# Patient Record
Sex: Female | Born: 1985 | Race: White | Hispanic: No | Marital: Single | Smoking: Current every day smoker
Health system: Southern US, Community
[De-identification: ages and names within clinical notes are randomized; demographics above are authoritative.]

## PROBLEM LIST (undated history)

## (undated) DIAGNOSIS — F603 Borderline personality disorder: Secondary | ICD-10-CM

## (undated) DIAGNOSIS — F4310 Post-traumatic stress disorder, unspecified: Secondary | ICD-10-CM

## (undated) DIAGNOSIS — F32A Depression, unspecified: Secondary | ICD-10-CM

## (undated) DIAGNOSIS — M329 Systemic lupus erythematosus, unspecified: Secondary | ICD-10-CM

## (undated) DIAGNOSIS — F419 Anxiety disorder, unspecified: Secondary | ICD-10-CM

## (undated) DIAGNOSIS — D649 Anemia, unspecified: Secondary | ICD-10-CM

## (undated) DIAGNOSIS — D802 Selective deficiency of immunoglobulin A [IgA]: Secondary | ICD-10-CM

## (undated) DIAGNOSIS — G932 Benign intracranial hypertension: Secondary | ICD-10-CM

## (undated) DIAGNOSIS — IMO0002 Reserved for concepts with insufficient information to code with codable children: Secondary | ICD-10-CM

## (undated) DIAGNOSIS — I38 Endocarditis, valve unspecified: Secondary | ICD-10-CM

## (undated) HISTORY — DX: Benign intracranial hypertension: G93.2

## (undated) HISTORY — DX: Depression, unspecified: F32.A

## (undated) HISTORY — DX: Borderline personality disorder: F60.3

## (undated) HISTORY — DX: Anemia, unspecified: D64.9

## (undated) HISTORY — PX: TONSILLECTOMY: SUR1361

## (undated) HISTORY — PX: FINGER AMPUTATION: SHX636

## (undated) HISTORY — DX: Post-traumatic stress disorder, unspecified: F43.10

## (undated) HISTORY — PX: COLON SURGERY: SHX602

## (undated) HISTORY — DX: Anxiety disorder, unspecified: F41.9

## (undated) MED FILL — Iron Sucrose Inj 20 MG/ML (Fe Equiv): INTRAVENOUS | Qty: 10 | Status: AC

---

## 2007-09-06 ENCOUNTER — Emergency Department (HOSPITAL_COMMUNITY): Admission: EM | Admit: 2007-09-06 | Discharge: 2007-09-06 | Payer: Self-pay | Admitting: Emergency Medicine

## 2007-09-06 IMAGING — CR DG CHEST 2V
2 series · 2 of 2 positions shown · non-contrast
Comparison: None

CLINICAL DATA: *Fever; chest tightness

CHEST - 2 VIEW

[w chest pa]
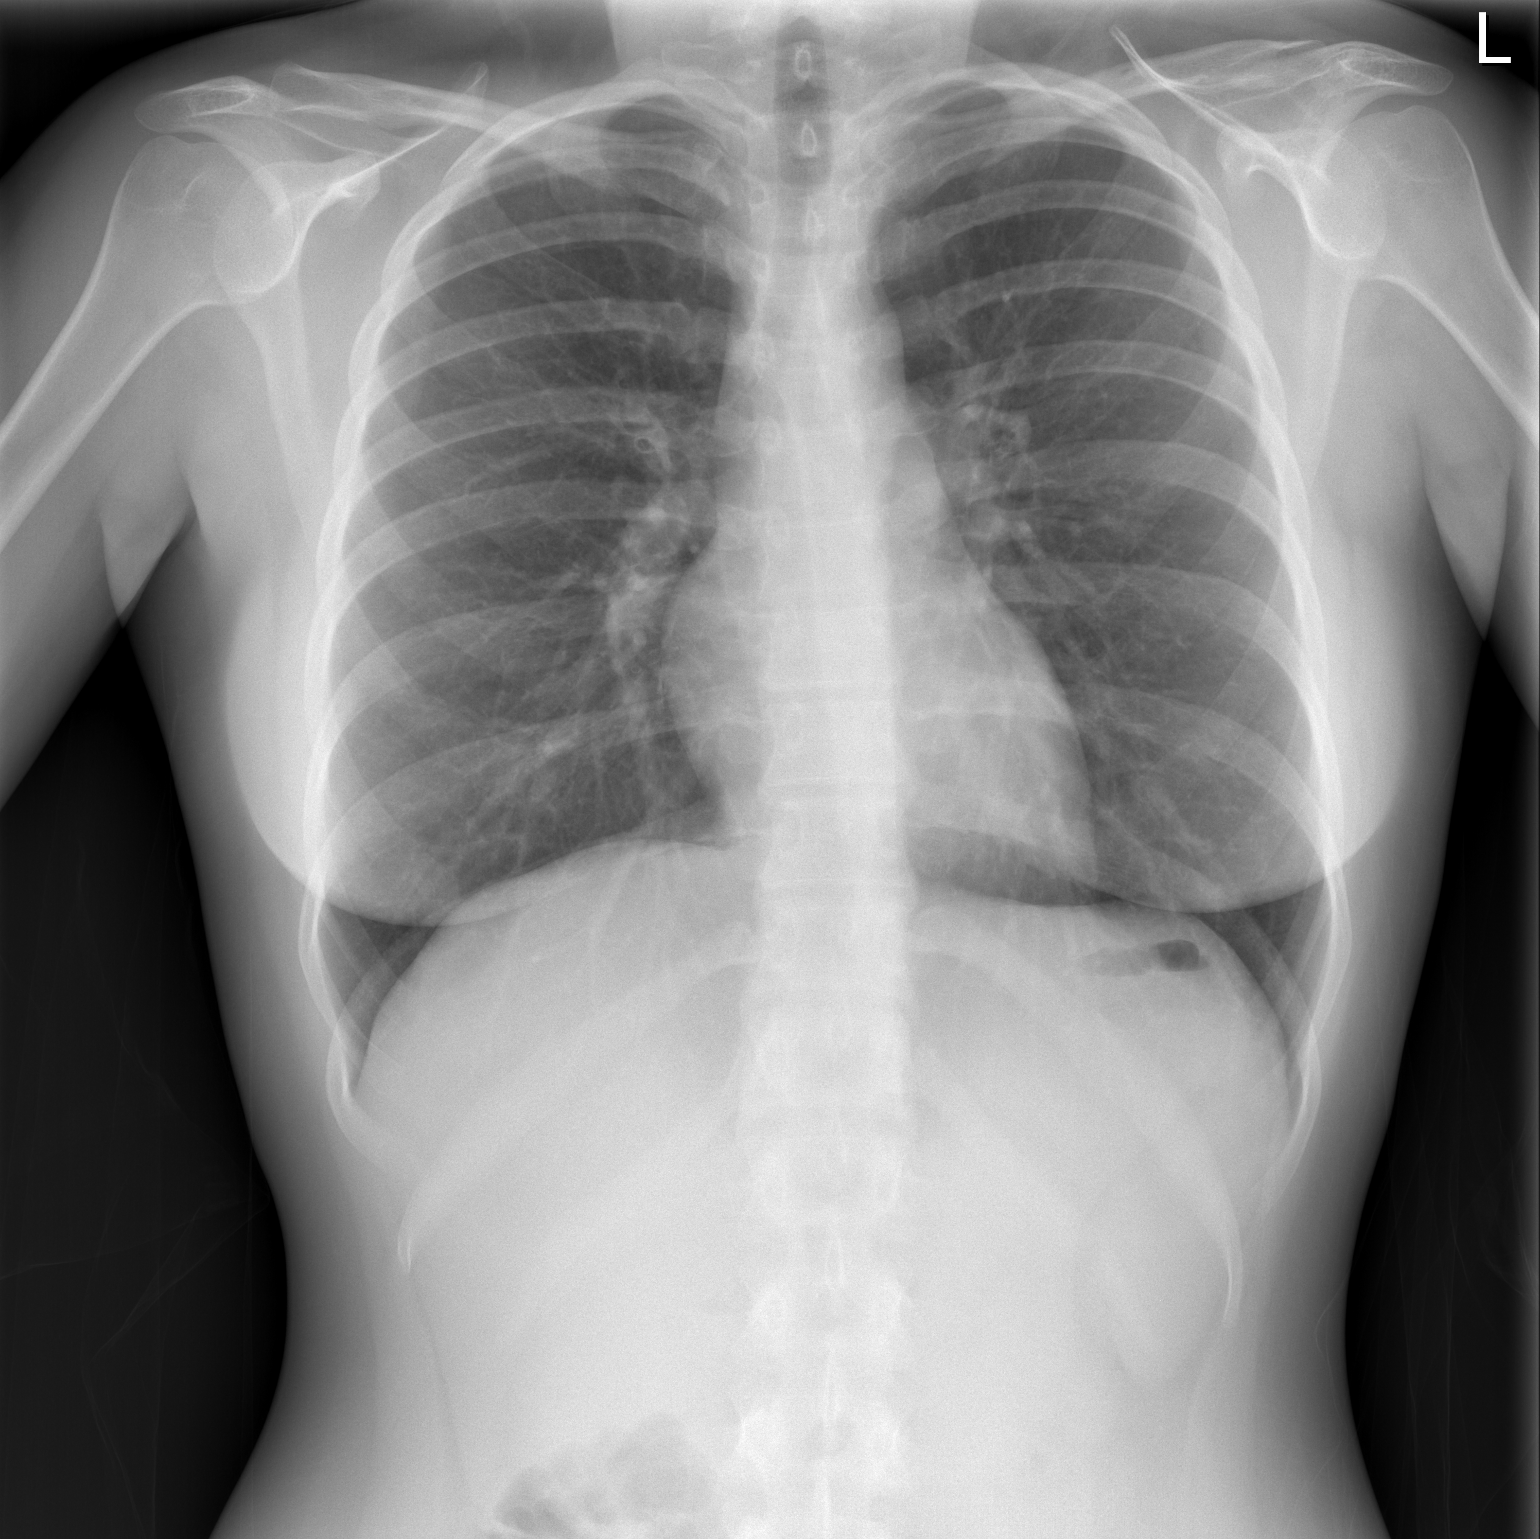

[w chest lat]
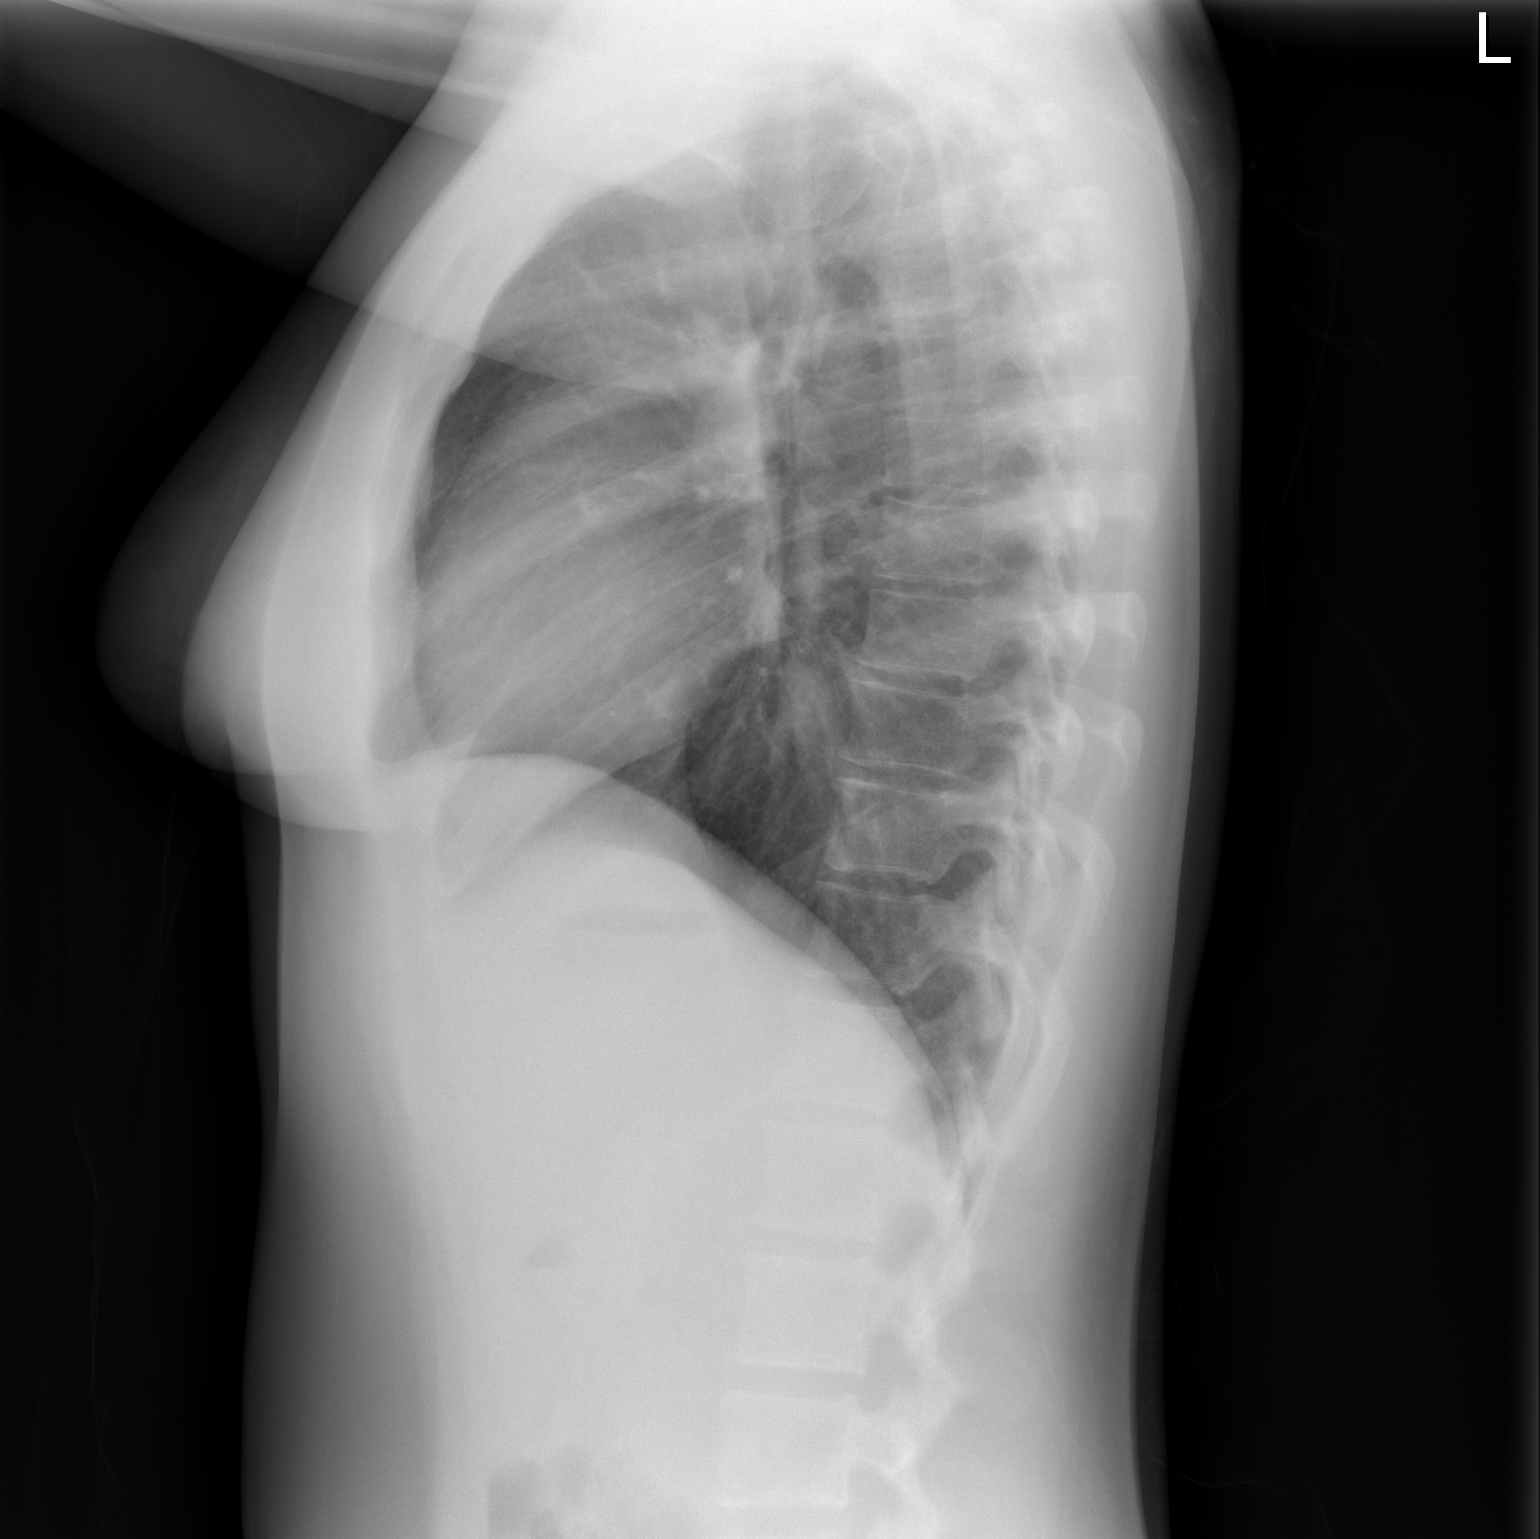

[2 of 2 positions shown; findings below may reference images not displayed]

FINDINGS: The heart size and mediastinal contours are within normal
limits.  Both lungs are clear.  The visualized skeletal structures
are unremarkable.
IMPRESSION: No active disease.

## 2008-07-01 ENCOUNTER — Emergency Department (HOSPITAL_COMMUNITY): Admission: EM | Admit: 2008-07-01 | Discharge: 2008-07-02 | Payer: Self-pay | Admitting: Emergency Medicine

## 2008-12-10 ENCOUNTER — Emergency Department (HOSPITAL_COMMUNITY): Admission: EM | Admit: 2008-12-10 | Discharge: 2008-12-11 | Payer: Self-pay | Admitting: Emergency Medicine

## 2009-02-21 ENCOUNTER — Emergency Department (HOSPITAL_COMMUNITY): Admission: EM | Admit: 2009-02-21 | Discharge: 2009-02-21 | Payer: Self-pay | Admitting: Emergency Medicine

## 2009-02-21 IMAGING — US US PELVIS COMPLETE MODIFY
1 series · 14 of 25 positions shown · non-contrast
Comparison: None.

CLINICAL DATA: Lower abdominal pain, cramps, left adnexal
tenderness

TRANSABDOMINAL AND TRANSVAGINAL ULTRASOUND OF PELVIS
TECHNIQUE: Both transabdominal and transvaginal ultrasound
examinations of the pelvis were performed including evaluation of
the uterus, ovaries, adnexal regions, and pelvic cul-de-sac.

[Series 1: us pelvis complete modify · 0.25mm/px · 14 of 53 slices shown]
[im 1/53]
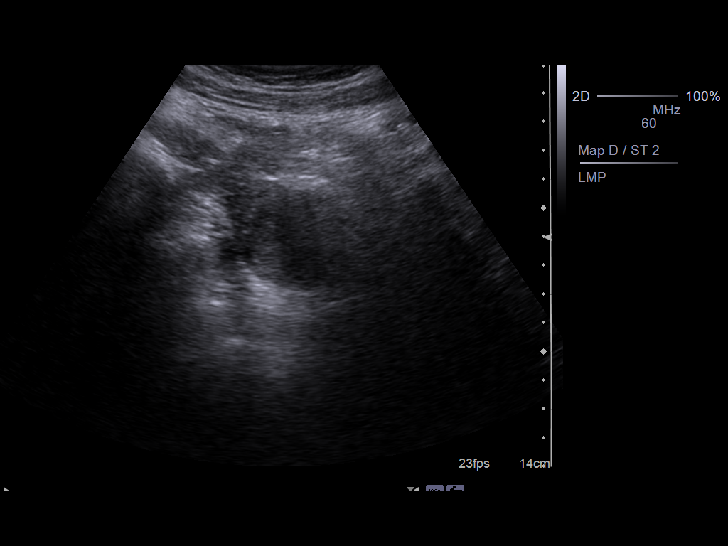
[im 5/53]
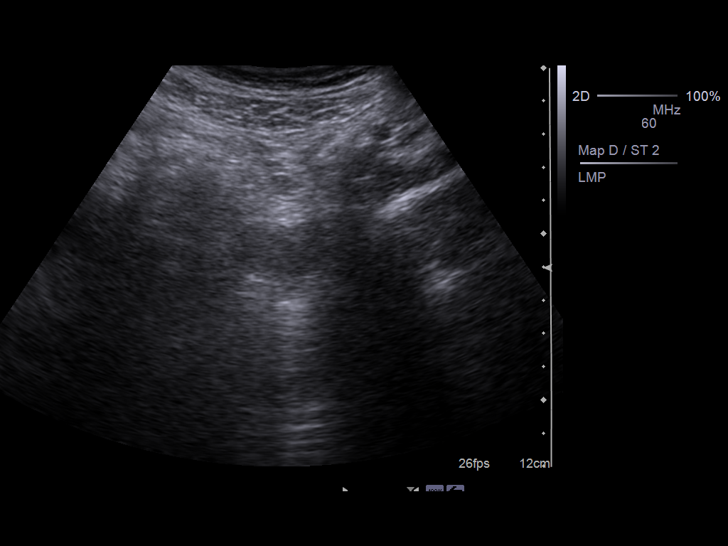
[im 9/53]
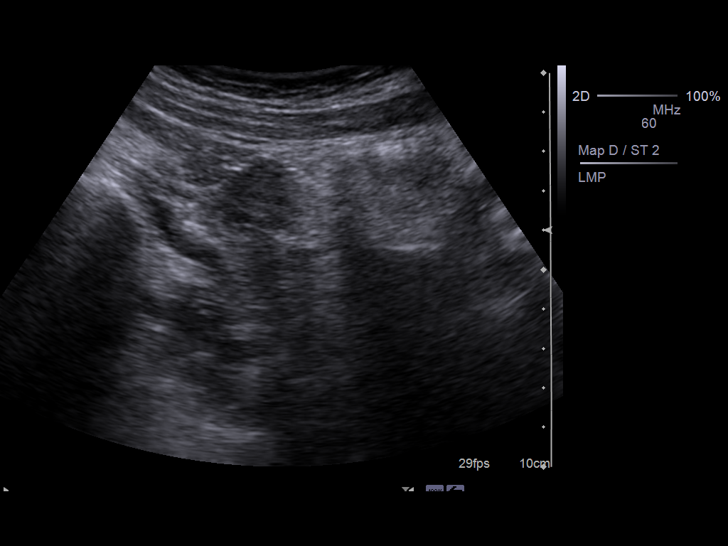
[im 14/53]
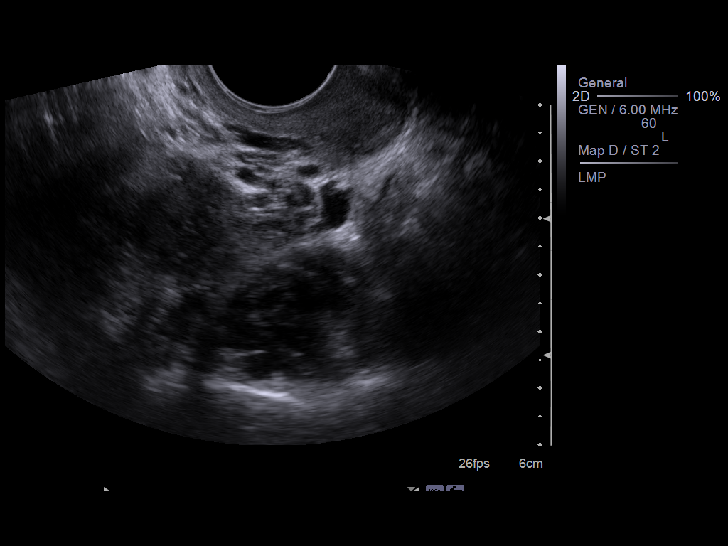
[im 18/53]
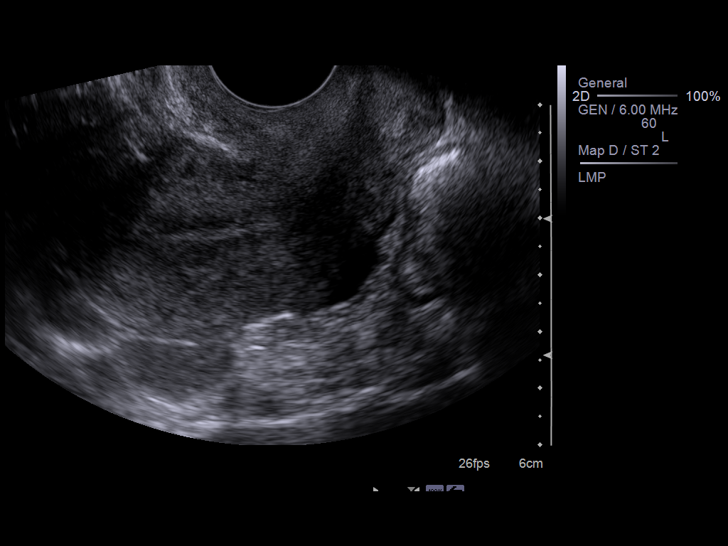
[im 20/53]
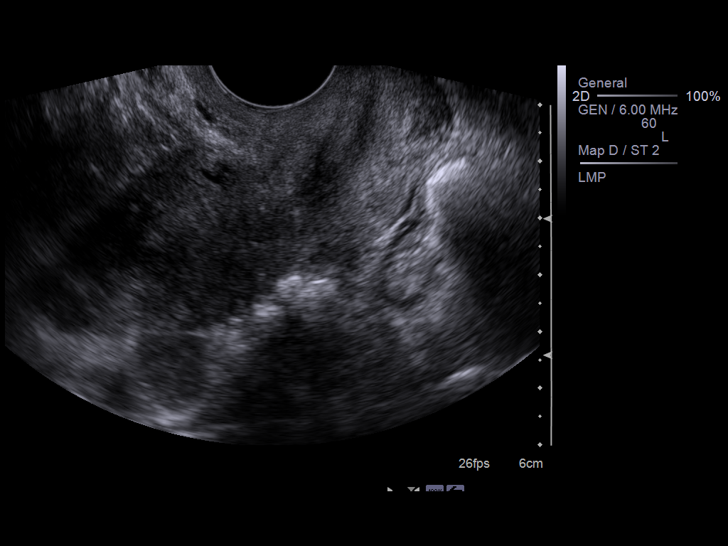
[im 24/53]
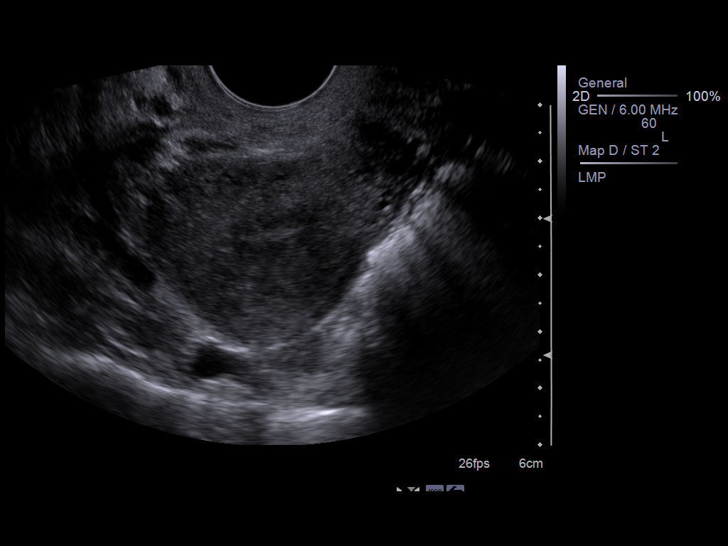
[im 29/53]
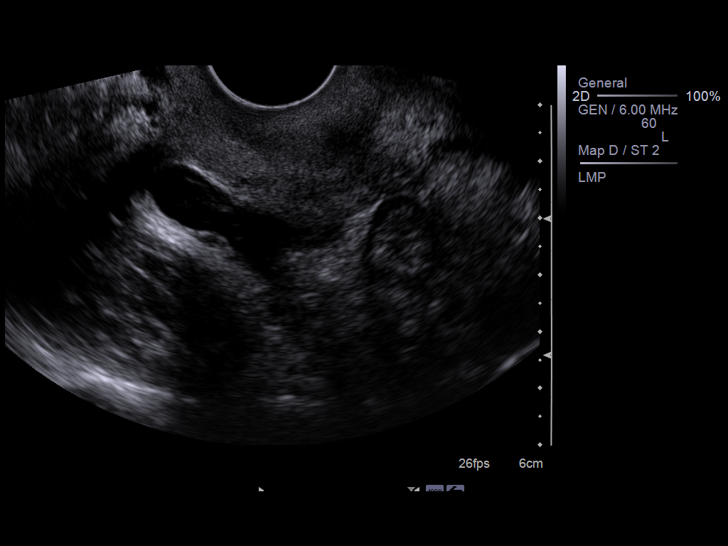
[im 33/53]
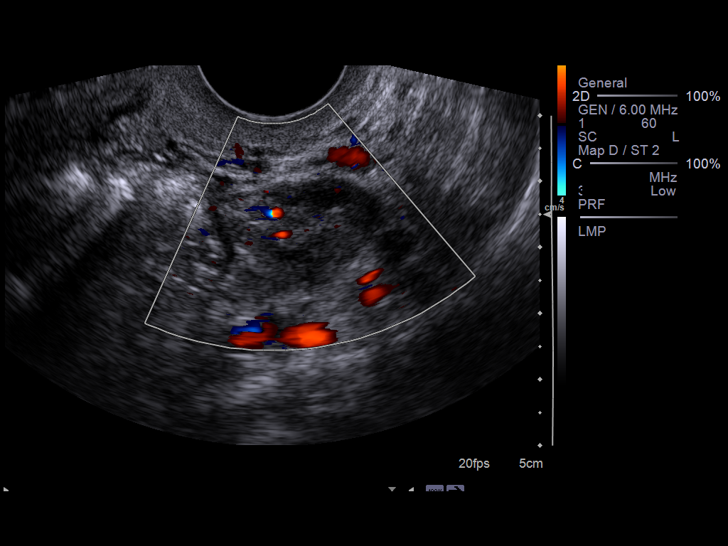
[im 35/53]
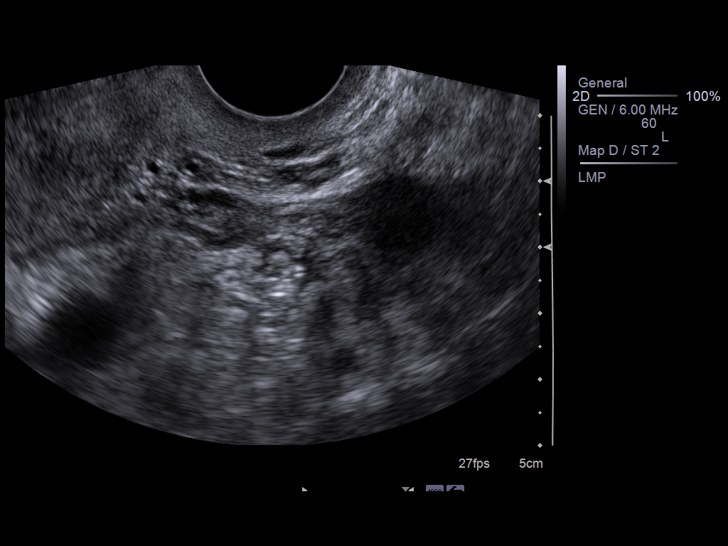
[im 40/53]
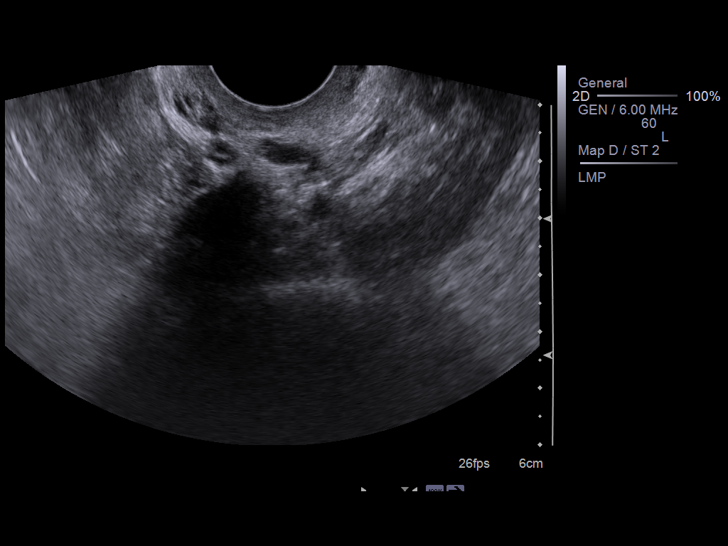
[im 44/53]
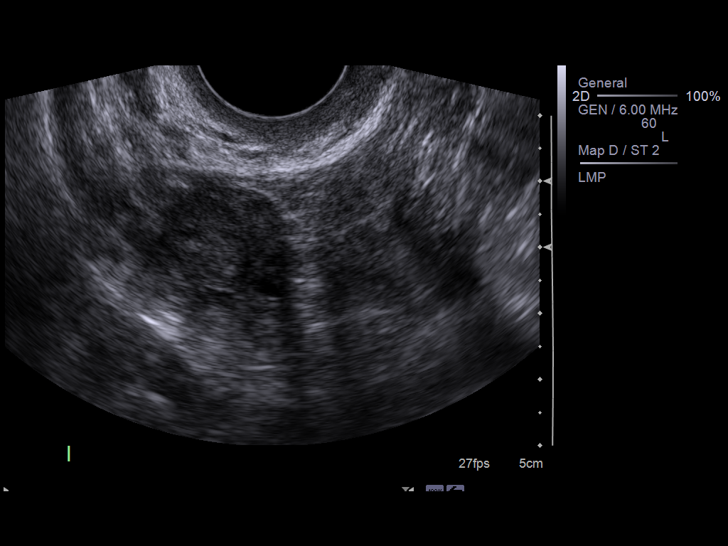
[im 48/53]
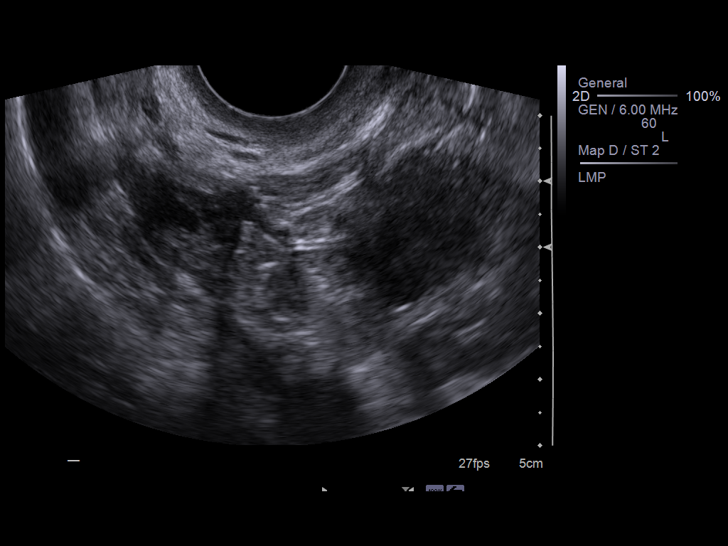
[im 53/53]
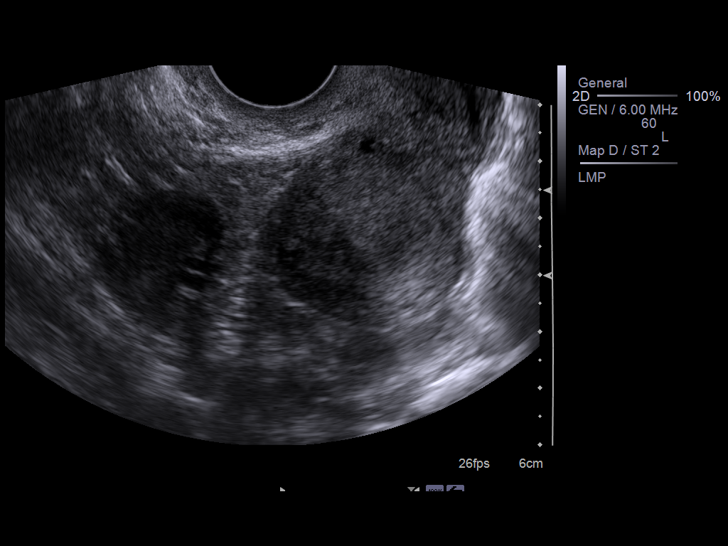

[14 of 25 positions shown; findings below may reference images not displayed]

FINDINGS: Uterus measures 7.2 cm length by 3.5 cm AP by 3.8 cm transverse.
No uterine mass.

Endometrium measures 2 mm thick, normal.  No endometrial fluid.

Right Ovary measures 3.2 x 2.3 x 2.3 cm. Normal morphology without
mass.

Left Ovary measures 2.6 x 1.4 x 2.2 cm.  Normal morphology without
mass.

Other Findings:  Small amount of nonspecific free pelvic fluid.
No adnexal masses.
IMPRESSION: Small amount nonspecific free pelvic fluid.
No other intrapelvic abnormalities identified sonographically.

## 2010-06-04 LAB — POCT I-STAT, CHEM 8
Chloride: 118 mEq/L — ABNORMAL HIGH (ref 96–112)
Glucose, Bld: 80 mg/dL (ref 70–99)
HCT: 38 % (ref 36.0–46.0)
Potassium: 3.8 mEq/L (ref 3.5–5.1)
Sodium: 138 mEq/L (ref 135–145)

## 2010-06-04 LAB — DIFFERENTIAL
Basophils Relative: 1 % (ref 0–1)
Monocytes Relative: 8 % (ref 3–12)
Neutro Abs: 4.3 10*3/uL (ref 1.7–7.7)
Neutrophils Relative %: 42 % — ABNORMAL LOW (ref 43–77)

## 2010-06-04 LAB — URINALYSIS, ROUTINE W REFLEX MICROSCOPIC
Bilirubin Urine: NEGATIVE
Leukocytes, UA: NEGATIVE
Nitrite: NEGATIVE
Specific Gravity, Urine: 1.03 (ref 1.005–1.030)
pH: 5.5 (ref 5.0–8.0)

## 2010-06-04 LAB — CBC
HCT: 37.6 % (ref 36.0–46.0)
Hemoglobin: 13.1 g/dL (ref 12.0–15.0)
MCHC: 34.8 g/dL (ref 30.0–36.0)
MCV: 96 fL (ref 78.0–100.0)
Platelets: 246 10*3/uL (ref 150–400)

## 2010-06-04 LAB — URINE MICROSCOPIC-ADD ON

## 2010-06-04 LAB — GC/CHLAMYDIA PROBE AMP, GENITAL

## 2010-06-04 LAB — POCT PREGNANCY, URINE

## 2010-06-04 LAB — WET PREP, GENITAL

## 2010-06-07 LAB — URINALYSIS, ROUTINE W REFLEX MICROSCOPIC
Nitrite: NEGATIVE
Protein, ur: NEGATIVE mg/dL
Specific Gravity, Urine: 1.009 (ref 1.005–1.030)
Urobilinogen, UA: 0.2 mg/dL (ref 0.0–1.0)

## 2010-06-07 LAB — POCT I-STAT, CHEM 8
Calcium, Ion: 1.17 mmol/L (ref 1.12–1.32)
Glucose, Bld: 111 mg/dL — ABNORMAL HIGH (ref 70–99)
HCT: 42 % (ref 36.0–46.0)
TCO2: 25 mmol/L (ref 0–100)

## 2010-06-07 LAB — POCT PREGNANCY, URINE

## 2010-06-07 LAB — D-DIMER, QUANTITATIVE

## 2010-11-29 LAB — POCT PREGNANCY, URINE: Operator id: 261601

## 2010-11-29 LAB — RAPID URINE DRUG SCREEN, HOSP PERFORMED
Amphetamines: NOT DETECTED
Benzodiazepines: NOT DETECTED
Cocaine: NOT DETECTED

## 2010-11-29 LAB — POCT I-STAT, CHEM 8
Creatinine, Ser: 0.9
HCT: 36
Hemoglobin: 12.2
Potassium: 3.7
Sodium: 140
TCO2: 19

## 2010-11-29 LAB — CBC
HCT: 42.6
MCHC: 33.9
MCV: 87.7
Platelets: 240
RDW: 13.5
WBC: 4.3

## 2010-11-29 LAB — URINALYSIS, ROUTINE W REFLEX MICROSCOPIC
Bilirubin Urine: NEGATIVE
Glucose, UA: NEGATIVE
Ketones, ur: NEGATIVE
Nitrite: NEGATIVE
Protein, ur: NEGATIVE
pH: 6

## 2010-11-29 LAB — DIFFERENTIAL
Basophils Absolute: 0
Basophils Relative: 1
Eosinophils Absolute: 0.1
Eosinophils Relative: 1
Neutrophils Relative %: 49

## 2016-03-20 DIAGNOSIS — O0993 Supervision of high risk pregnancy, unspecified, third trimester: Secondary | ICD-10-CM | POA: Insufficient documentation

## 2016-03-27 DIAGNOSIS — F1290 Cannabis use, unspecified, uncomplicated: Secondary | ICD-10-CM | POA: Insufficient documentation

## 2016-03-27 DIAGNOSIS — R768 Other specified abnormal immunological findings in serum: Secondary | ICD-10-CM | POA: Insufficient documentation

## 2016-03-27 DIAGNOSIS — Z8759 Personal history of other complications of pregnancy, childbirth and the puerperium: Secondary | ICD-10-CM | POA: Insufficient documentation

## 2016-03-27 DIAGNOSIS — Z98891 History of uterine scar from previous surgery: Secondary | ICD-10-CM | POA: Insufficient documentation

## 2016-03-27 DIAGNOSIS — D802 Selective deficiency of immunoglobulin A [IgA]: Secondary | ICD-10-CM | POA: Insufficient documentation

## 2016-03-27 DIAGNOSIS — Z8279 Family history of other congenital malformations, deformations and chromosomal abnormalities: Secondary | ICD-10-CM | POA: Insufficient documentation

## 2016-04-03 DIAGNOSIS — F419 Anxiety disorder, unspecified: Secondary | ICD-10-CM | POA: Insufficient documentation

## 2016-04-03 DIAGNOSIS — O99340 Other mental disorders complicating pregnancy, unspecified trimester: Secondary | ICD-10-CM | POA: Insufficient documentation

## 2016-11-12 DIAGNOSIS — F331 Major depressive disorder, recurrent, moderate: Secondary | ICD-10-CM | POA: Insufficient documentation

## 2018-01-01 DIAGNOSIS — F1120 Opioid dependence, uncomplicated: Secondary | ICD-10-CM | POA: Insufficient documentation

## 2018-06-16 DIAGNOSIS — R7881 Bacteremia: Secondary | ICD-10-CM | POA: Insufficient documentation

## 2018-06-16 DIAGNOSIS — B9562 Methicillin resistant Staphylococcus aureus infection as the cause of diseases classified elsewhere: Secondary | ICD-10-CM | POA: Insufficient documentation

## 2018-06-16 DIAGNOSIS — F1990 Other psychoactive substance use, unspecified, uncomplicated: Secondary | ICD-10-CM | POA: Diagnosis present

## 2018-06-21 DIAGNOSIS — F1910 Other psychoactive substance abuse, uncomplicated: Secondary | ICD-10-CM | POA: Insufficient documentation

## 2018-06-21 DIAGNOSIS — Z720 Tobacco use: Secondary | ICD-10-CM | POA: Diagnosis present

## 2018-06-21 DIAGNOSIS — E876 Hypokalemia: Secondary | ICD-10-CM | POA: Insufficient documentation

## 2018-06-21 DIAGNOSIS — G932 Benign intracranial hypertension: Secondary | ICD-10-CM | POA: Insufficient documentation

## 2018-10-04 DIAGNOSIS — Z8679 Personal history of other diseases of the circulatory system: Secondary | ICD-10-CM

## 2018-10-04 DIAGNOSIS — Z789 Other specified health status: Secondary | ICD-10-CM | POA: Insufficient documentation

## 2019-04-09 ENCOUNTER — Other Ambulatory Visit: Payer: Self-pay

## 2019-04-09 ENCOUNTER — Emergency Department
Admission: EM | Admit: 2019-04-09 | Discharge: 2019-04-09 | Disposition: A | Discharge status: Home / Self Care | Payer: Self-pay | Attending: Emergency Medicine | Admitting: Emergency Medicine

## 2019-04-09 ENCOUNTER — Encounter: Payer: Self-pay | Admitting: Intensive Care

## 2019-04-09 DIAGNOSIS — F17210 Nicotine dependence, cigarettes, uncomplicated: Secondary | ICD-10-CM | POA: Insufficient documentation

## 2019-04-09 DIAGNOSIS — R42 Dizziness and giddiness: Secondary | ICD-10-CM | POA: Insufficient documentation

## 2019-04-09 HISTORY — DX: Selective deficiency of immunoglobulin a (iga): D80.2

## 2019-04-09 HISTORY — DX: Reserved for concepts with insufficient information to code with codable children: IMO0002

## 2019-04-09 HISTORY — DX: Systemic lupus erythematosus, unspecified: M32.9

## 2019-04-09 NOTE — Discharge Instructions (Signed)
Stay hydrated.  Eat before going to work. Try to stay in a cool environment. If you feel yourself becoming dizzy stop and slow down. Return to the emergency department or see your regular doctor if not better/worsening.

## 2019-04-09 NOTE — ED Triage Notes (Signed)
Patient works at [employer] and reports they told her she had to get a wellness check and an ok to return to work. Denies pain. Denies symptoms.

## 2019-04-09 NOTE — ED Notes (Signed)
Pt here for note for work after feeling lightheaded at work. Pt's work wanted her to be checked out before she comes back to work.

## 2019-04-09 NOTE — ED Provider Notes (Signed)
[facility] Emergency Department Provider Note  ____________________________________________   First MD Initiated Contact with Patient 04/09/19 1746     (approximate)  I have reviewed the triage vital signs and the nursing notes.   HISTORY  Chief Complaint No chief complaint on file.    HPI Karen Clarke is a 34 y.o. female presents emergency department stating that she works at [employer] and they need a work note for her to return to work.  She states that she got dizzy at work.  She had not eaten or had anything to drink earlier today.  While at work she became dizzy, her blood pressure dropped, and they became concerned.  They wanted her evaluated prior to returning to work.  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   She is followed at [facility].  The patient states she feels fine now.  She states she felt much better after she went home and had some water and had something to eat.   Past Medical History:  Diagnosis Date  . IgA deficiency (HCC)   . Lupus (HCC)     There are no problems to display for this patient.   History reviewed. No pertinent surgical history.  Prior to Admission medications   Not on File    Allergies Amoxicillin  History reviewed. No pertinent family history.  Social History Social History   Tobacco Use  . Smoking status: Current Every Day Smoker    Types: Cigarettes  . Smokeless tobacco: Never Used  Substance Use Topics  . Alcohol use: Never  . Drug use: Never    Review of Systems  Constitutional: No fever/chills, positive for dizziness Eyes: No visual changes. ENT: No sore throat. Respiratory: Denies cough Cardiovascular: Denies chest pain Gastrointestinal: Denies abdominal pain Genitourinary: Negative for dysuria. Musculoskeletal: Negative for back pain. Skin: Negative for rash. Psychiatric: no mood changes,     ____________________________________________   PHYSICAL  EXAM:  VITAL SIGNS: ED Triage Vitals  Enc Vitals Group     BP 04/09/19 1739 (!) 138/96     Pulse Rate 04/09/19 1739 100     Resp 04/09/19 1739 16     Temp 04/09/19 1739 98.4 F (36.9 C)     Temp Source 04/09/19 1739 Oral     SpO2 04/09/19 1739 99 %     Weight 04/09/19 1740 160 lb (72.6 kg)     Height 04/09/19 1740 5\' 3"  (1.6 m)     Head Circumference --      Peak Flow --      Pain Score 04/09/19 1739 0     Pain Loc --      Pain Edu? --      Excl. in [place]? --     Constitutional: Alert and oriented. Well appearing and in no acute distress. Eyes: Conjunctivae are normal.  Head: Atraumatic. Nose: No congestion/rhinnorhea. Mouth/Throat: Mucous membranes are moist.   Neck:  supple no lymphadenopathy noted Cardiovascular: Normal rate, regular rhythm. Heart sounds are normal Respiratory: Normal respiratory effort.  No retractions, lungs c t a  GU: deferred Musculoskeletal: FROM all extremities, warm and well perfused Neurologic:  Normal speech and language.  Skin:  Skin is warm, dry and intact. No rash noted. Psychiatric: Mood and affect are normal. Speech and behavior are normal.  ____________________________________________   LABS (all labs ordered are listed, but only abnormal results are displayed)  Labs Reviewed - No data to display ____________________________________________  ____________________________________________  RADIOLOGY    ____________________________________________   PROCEDURES  Procedure(s) performed: EKG shows normal sinus rhythm   Procedures    ____________________________________________   INITIAL IMPRESSION / ASSESSMENT AND PLAN / ED COURSE  Pertinent labs & imaging results that were available during my care of the patient were reviewed by me and considered in my medical decision making (see chart for details).   Patient is 34 year old female presents emergency department needing clearance for work.  See HPI  Physical exam patient  appears very well.  Vitals are normal.  She is very talkative.  The remainder the exam is unremarkable  EKG shows normal sinus rhythm.  The patient was given strict instructions to return if worsening.  She was given instructions to eat and drink prior to going to work.  She stay hydrated throughout the workday.  If her symptoms keep returning she will need to be evaluated by her cardiologist.  She states she understands and will comply.  She was discharged in stable condition.    Karen Clarke was evaluated in Emergency Department on 04/09/2019 for the symptoms described in the history of present illness. She was evaluated in the context of the global COVID-19 pandemic, which necessitated consideration that the patient might be at risk for infection with the SARS-CoV-2 virus that causes COVID-19. Institutional protocols and algorithms that pertain to the evaluation of patients at risk for COVID-19 are in a state of rapid change based on information released by regulatory bodies including the CDC and federal and state organizations. These policies and algorithms were followed during the patient's care in the ED.   As part of my medical decision making, I reviewed the following data within the [place] notes reviewed and incorporated, EKG interpreted NSR, Old chart reviewed, Notes from prior ED visits and [place] Controlled Substance Database  ____________________________________________   FINAL CLINICAL IMPRESSION(S) / ED DIAGNOSES  Final diagnoses:  Nonspecific dizziness      NEW MEDICATIONS STARTED DURING THIS VISIT:  New Prescriptions   No medications on file     Note:  This document was prepared using Dragon voice recognition software and may include unintentional dictation errors.    Versie Starks, PA-C 04/09/19 Tereso Newcomer, MD 04/09/19 Sharilyn Sites

## 2019-12-25 DIAGNOSIS — L0291 Cutaneous abscess, unspecified: Secondary | ICD-10-CM | POA: Insufficient documentation

## 2020-02-19 DIAGNOSIS — L97822 Non-pressure chronic ulcer of other part of left lower leg with fat layer exposed: Secondary | ICD-10-CM | POA: Insufficient documentation

## 2020-02-19 DIAGNOSIS — M659 Synovitis and tenosynovitis, unspecified: Secondary | ICD-10-CM | POA: Insufficient documentation

## 2020-02-22 DIAGNOSIS — I771 Stricture of artery: Secondary | ICD-10-CM | POA: Insufficient documentation

## 2020-02-22 DIAGNOSIS — I96 Gangrene, not elsewhere classified: Secondary | ICD-10-CM | POA: Insufficient documentation

## 2020-02-23 DIAGNOSIS — B999 Unspecified infectious disease: Secondary | ICD-10-CM | POA: Insufficient documentation

## 2020-04-08 DIAGNOSIS — H05012 Cellulitis of left orbit: Secondary | ICD-10-CM | POA: Insufficient documentation

## 2020-05-22 DIAGNOSIS — D219 Benign neoplasm of connective and other soft tissue, unspecified: Secondary | ICD-10-CM | POA: Insufficient documentation

## 2020-05-22 DIAGNOSIS — Z862 Personal history of diseases of the blood and blood-forming organs and certain disorders involving the immune mechanism: Secondary | ICD-10-CM | POA: Insufficient documentation

## 2020-08-17 DIAGNOSIS — T8189XA Other complications of procedures, not elsewhere classified, initial encounter: Secondary | ICD-10-CM | POA: Insufficient documentation

## 2021-02-06 ENCOUNTER — Encounter: Payer: Self-pay | Admitting: Emergency Medicine

## 2021-02-06 ENCOUNTER — Emergency Department: Payer: BLUE CROSS/BLUE SHIELD

## 2021-02-06 ENCOUNTER — Other Ambulatory Visit: Payer: Self-pay

## 2021-02-06 DIAGNOSIS — M329 Systemic lupus erythematosus, unspecified: Secondary | ICD-10-CM | POA: Diagnosis present

## 2021-02-06 DIAGNOSIS — F1990 Other psychoactive substance use, unspecified, uncomplicated: Secondary | ICD-10-CM | POA: Diagnosis not present

## 2021-02-06 DIAGNOSIS — L02413 Cutaneous abscess of right upper limb: Secondary | ICD-10-CM | POA: Diagnosis present

## 2021-02-06 DIAGNOSIS — D802 Selective deficiency of immunoglobulin A [IgA]: Secondary | ICD-10-CM | POA: Diagnosis present

## 2021-02-06 DIAGNOSIS — B952 Enterococcus as the cause of diseases classified elsewhere: Secondary | ICD-10-CM | POA: Diagnosis present

## 2021-02-06 DIAGNOSIS — F329 Major depressive disorder, single episode, unspecified: Secondary | ICD-10-CM | POA: Diagnosis present

## 2021-02-06 DIAGNOSIS — Z8679 Personal history of other diseases of the circulatory system: Secondary | ICD-10-CM

## 2021-02-06 DIAGNOSIS — R7881 Bacteremia: Secondary | ICD-10-CM | POA: Diagnosis present

## 2021-02-06 DIAGNOSIS — Z79899 Other long term (current) drug therapy: Secondary | ICD-10-CM

## 2021-02-06 DIAGNOSIS — F1110 Opioid abuse, uncomplicated: Secondary | ICD-10-CM | POA: Diagnosis present

## 2021-02-06 DIAGNOSIS — D649 Anemia, unspecified: Secondary | ICD-10-CM | POA: Diagnosis present

## 2021-02-06 DIAGNOSIS — Z89021 Acquired absence of right finger(s): Secondary | ICD-10-CM

## 2021-02-06 DIAGNOSIS — R911 Solitary pulmonary nodule: Secondary | ICD-10-CM | POA: Diagnosis present

## 2021-02-06 DIAGNOSIS — Z20822 Contact with and (suspected) exposure to covid-19: Secondary | ICD-10-CM | POA: Diagnosis present

## 2021-02-06 DIAGNOSIS — Z5329 Procedure and treatment not carried out because of patient's decision for other reasons: Secondary | ICD-10-CM | POA: Diagnosis not present

## 2021-02-06 DIAGNOSIS — L97809 Non-pressure chronic ulcer of other part of unspecified lower leg with unspecified severity: Secondary | ICD-10-CM | POA: Diagnosis present

## 2021-02-06 DIAGNOSIS — L0291 Cutaneous abscess, unspecified: Secondary | ICD-10-CM | POA: Diagnosis present

## 2021-02-06 DIAGNOSIS — L97829 Non-pressure chronic ulcer of other part of left lower leg with unspecified severity: Secondary | ICD-10-CM | POA: Diagnosis present

## 2021-02-06 DIAGNOSIS — Z888 Allergy status to other drugs, medicaments and biological substances status: Secondary | ICD-10-CM

## 2021-02-06 DIAGNOSIS — L03115 Cellulitis of right lower limb: Principal | ICD-10-CM | POA: Diagnosis present

## 2021-02-06 DIAGNOSIS — F17210 Nicotine dependence, cigarettes, uncomplicated: Secondary | ICD-10-CM | POA: Diagnosis present

## 2021-02-06 DIAGNOSIS — Z880 Allergy status to penicillin: Secondary | ICD-10-CM

## 2021-02-06 DIAGNOSIS — F4310 Post-traumatic stress disorder, unspecified: Secondary | ICD-10-CM | POA: Diagnosis present

## 2021-02-06 DIAGNOSIS — L03116 Cellulitis of left lower limb: Secondary | ICD-10-CM | POA: Diagnosis present

## 2021-02-06 DIAGNOSIS — L02414 Cutaneous abscess of left upper limb: Secondary | ICD-10-CM | POA: Diagnosis present

## 2021-02-06 LAB — CBC WITH DIFFERENTIAL/PLATELET
Abs Immature Granulocytes: 0.04 10*3/uL (ref 0.00–0.07)
Basophils Absolute: 0 10*3/uL (ref 0.0–0.1)
Basophils Relative: 0 %
Eosinophils Absolute: 0.1 10*3/uL (ref 0.0–0.5)
Eosinophils Relative: 1 %
HCT: 33.6 % — ABNORMAL LOW (ref 36.0–46.0)
Hemoglobin: 10.3 g/dL — ABNORMAL LOW (ref 12.0–15.0)
Immature Granulocytes: 0 %
Lymphocytes Relative: 19 %
Lymphs Abs: 2.1 10*3/uL (ref 0.7–4.0)
MCH: 25.1 pg — ABNORMAL LOW (ref 26.0–34.0)
MCHC: 30.7 g/dL (ref 30.0–36.0)
MCV: 82 fL (ref 80.0–100.0)
Monocytes Absolute: 0.5 10*3/uL (ref 0.1–1.0)
Monocytes Relative: 5 %
Neutro Abs: 8.2 10*3/uL — ABNORMAL HIGH (ref 1.7–7.7)
Neutrophils Relative %: 75 %
Platelets: 401 10*3/uL — ABNORMAL HIGH (ref 150–400)
RBC: 4.1 MIL/uL (ref 3.87–5.11)
RDW: 15.5 % (ref 11.5–15.5)
WBC: 10.9 10*3/uL — ABNORMAL HIGH (ref 4.0–10.5)
nRBC: 0 % (ref 0.0–0.2)

## 2021-02-06 LAB — COMPREHENSIVE METABOLIC PANEL
ALT: 26 U/L (ref 0–44)
AST: 25 U/L (ref 15–41)
Albumin: 3.9 g/dL (ref 3.5–5.0)
Alkaline Phosphatase: 147 U/L — ABNORMAL HIGH (ref 38–126)
Anion gap: 7 (ref 5–15)
BUN: 21 mg/dL — ABNORMAL HIGH (ref 6–20)
CO2: 23 mmol/L (ref 22–32)
Calcium: 8.9 mg/dL (ref 8.9–10.3)
Chloride: 103 mmol/L (ref 98–111)
Creatinine, Ser: 0.96 mg/dL (ref 0.44–1.00)
GFR, Estimated: >60 mL/min (ref >60)
Glucose, Bld: 103 mg/dL — ABNORMAL HIGH (ref 70–99)
Potassium: 4.8 mmol/L (ref 3.5–5.1)
Sodium: 133 mmol/L — ABNORMAL LOW (ref 135–145)
Total Bilirubin: 0.3 mg/dL (ref 0.3–1.2)
Total Protein: 7.9 g/dL (ref 6.5–8.1)

## 2021-02-06 LAB — LACTIC ACID, PLASMA: Lactic Acid, Venous: 2 mmol/L (ref 0.5–1.9)

## 2021-02-06 IMAGING — CR DG CHEST 2V
2 series · 2 of 2 positions shown · non-contrast
Comparison: [DATE]

CLINICAL DATA: Cough and fever

EXAM:
CHEST - 2 VIEW

[chest pa]
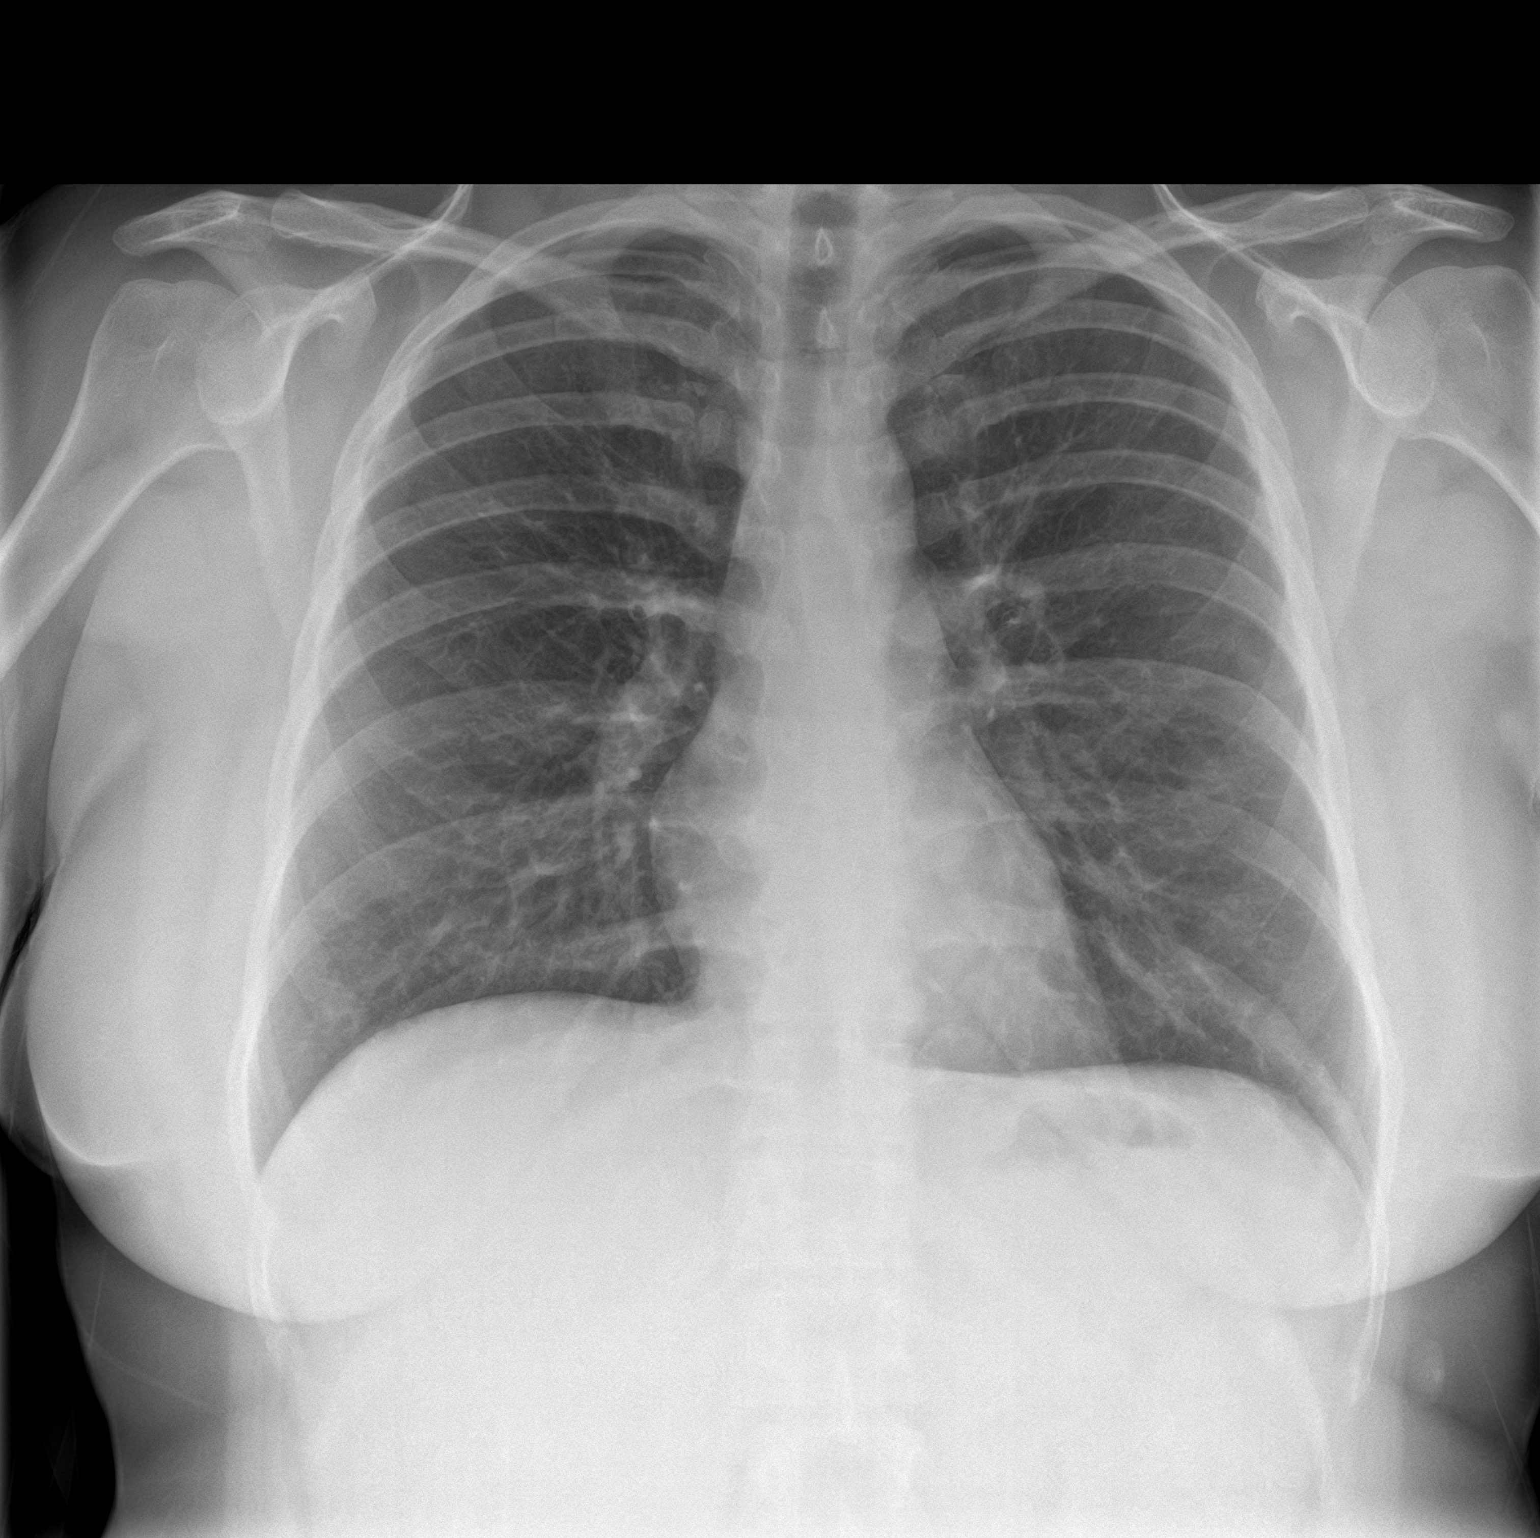

[chest lat]
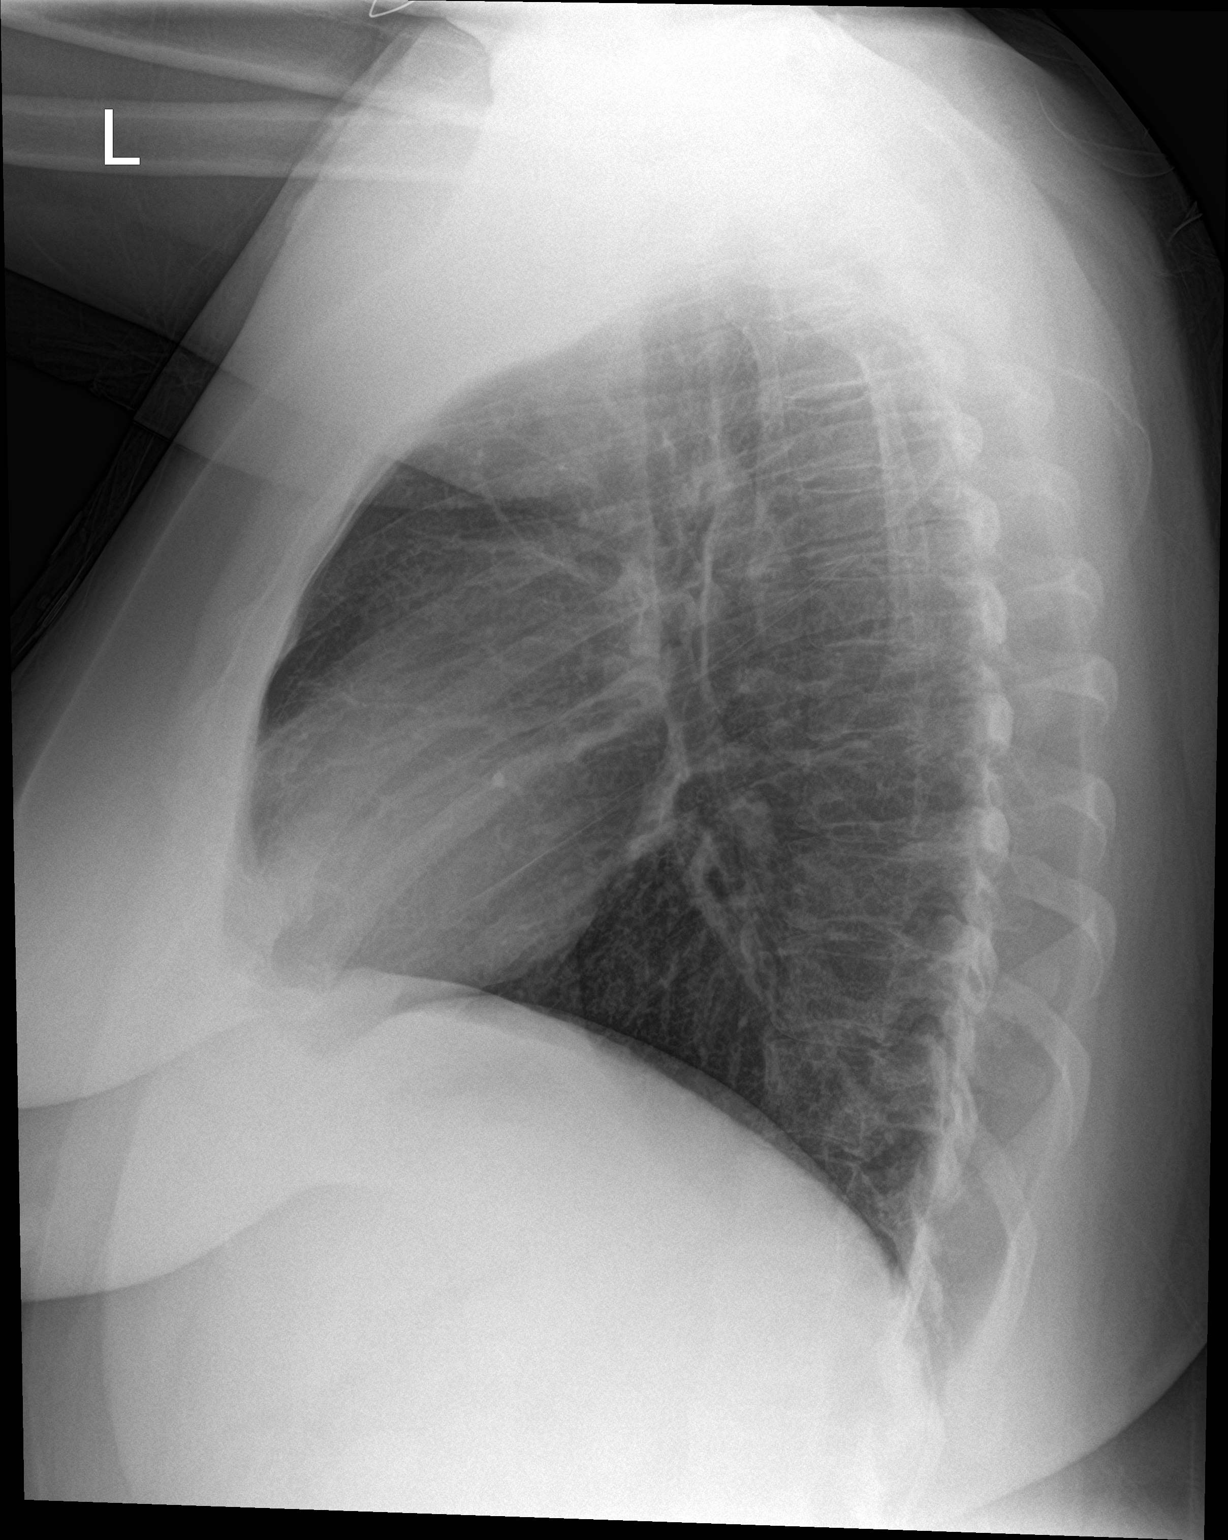

[2 of 2 positions shown; findings below may reference images not displayed]

FINDINGS: The heart size and mediastinal contours are within normal limits.
Both lungs are clear. The visualized skeletal structures are
unremarkable.
IMPRESSION: No active cardiopulmonary disease.

## 2021-02-06 NOTE — ED Triage Notes (Signed)
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 She has one that is open in draining in her left shin. She has been having fever and chills for about a week, the cough and abscess have been a month.

## 2021-02-07 ENCOUNTER — Encounter: Payer: Self-pay | Admitting: Family Medicine

## 2021-02-07 ENCOUNTER — Inpatient Hospital Stay
Admission: EM | Admit: 2021-02-07 | Discharge: 2021-02-10 | DRG: 603 | Discharge status: Against Medical Advice | Payer: BLUE CROSS/BLUE SHIELD | Attending: Obstetrics and Gynecology | Admitting: Obstetrics and Gynecology

## 2021-02-07 ENCOUNTER — Inpatient Hospital Stay (HOSPITAL_COMMUNITY)
Admit: 2021-02-07 | Discharge: 2021-02-07 | Disposition: A | Discharge status: Home / Self Care | Payer: BLUE CROSS/BLUE SHIELD | Attending: Family Medicine | Admitting: Family Medicine

## 2021-02-07 ENCOUNTER — Emergency Department: Payer: BLUE CROSS/BLUE SHIELD

## 2021-02-07 DIAGNOSIS — L02414 Cutaneous abscess of left upper limb: Secondary | ICD-10-CM | POA: Diagnosis not present

## 2021-02-07 DIAGNOSIS — Z720 Tobacco use: Secondary | ICD-10-CM | POA: Diagnosis present

## 2021-02-07 DIAGNOSIS — L0390 Cellulitis, unspecified: Secondary | ICD-10-CM | POA: Diagnosis not present

## 2021-02-07 DIAGNOSIS — L03116 Cellulitis of left lower limb: Secondary | ICD-10-CM

## 2021-02-07 DIAGNOSIS — Z888 Allergy status to other drugs, medicaments and biological substances status: Secondary | ICD-10-CM | POA: Diagnosis not present

## 2021-02-07 DIAGNOSIS — Z89021 Acquired absence of right finger(s): Secondary | ICD-10-CM | POA: Diagnosis not present

## 2021-02-07 DIAGNOSIS — Z8679 Personal history of other diseases of the circulatory system: Secondary | ICD-10-CM | POA: Diagnosis not present

## 2021-02-07 DIAGNOSIS — F1110 Opioid abuse, uncomplicated: Secondary | ICD-10-CM | POA: Diagnosis present

## 2021-02-07 DIAGNOSIS — L03115 Cellulitis of right lower limb: Secondary | ICD-10-CM | POA: Diagnosis present

## 2021-02-07 DIAGNOSIS — L0291 Cutaneous abscess, unspecified: Secondary | ICD-10-CM | POA: Diagnosis present

## 2021-02-07 DIAGNOSIS — Z880 Allergy status to penicillin: Secondary | ICD-10-CM | POA: Diagnosis not present

## 2021-02-07 DIAGNOSIS — F17210 Nicotine dependence, cigarettes, uncomplicated: Secondary | ICD-10-CM | POA: Diagnosis present

## 2021-02-07 DIAGNOSIS — Z79899 Other long term (current) drug therapy: Secondary | ICD-10-CM | POA: Diagnosis not present

## 2021-02-07 DIAGNOSIS — D649 Anemia, unspecified: Secondary | ICD-10-CM | POA: Diagnosis present

## 2021-02-07 DIAGNOSIS — F4310 Post-traumatic stress disorder, unspecified: Secondary | ICD-10-CM | POA: Diagnosis present

## 2021-02-07 DIAGNOSIS — L97829 Non-pressure chronic ulcer of other part of left lower leg with unspecified severity: Secondary | ICD-10-CM | POA: Diagnosis present

## 2021-02-07 DIAGNOSIS — F32A Depression, unspecified: Secondary | ICD-10-CM

## 2021-02-07 DIAGNOSIS — L97809 Non-pressure chronic ulcer of other part of unspecified lower leg with unspecified severity: Secondary | ICD-10-CM | POA: Diagnosis present

## 2021-02-07 DIAGNOSIS — R7881 Bacteremia: Secondary | ICD-10-CM

## 2021-02-07 DIAGNOSIS — R0609 Other forms of dyspnea: Secondary | ICD-10-CM | POA: Diagnosis not present

## 2021-02-07 DIAGNOSIS — B952 Enterococcus as the cause of diseases classified elsewhere: Secondary | ICD-10-CM | POA: Diagnosis present

## 2021-02-07 DIAGNOSIS — L02413 Cutaneous abscess of right upper limb: Secondary | ICD-10-CM

## 2021-02-07 DIAGNOSIS — F1990 Other psychoactive substance use, unspecified, uncomplicated: Secondary | ICD-10-CM

## 2021-02-07 DIAGNOSIS — D802 Selective deficiency of immunoglobulin A [IgA]: Secondary | ICD-10-CM | POA: Diagnosis present

## 2021-02-07 DIAGNOSIS — F329 Major depressive disorder, single episode, unspecified: Secondary | ICD-10-CM | POA: Diagnosis present

## 2021-02-07 DIAGNOSIS — Z5329 Procedure and treatment not carried out because of patient's decision for other reasons: Secondary | ICD-10-CM | POA: Diagnosis not present

## 2021-02-07 DIAGNOSIS — S81802A Unspecified open wound, left lower leg, initial encounter: Secondary | ICD-10-CM

## 2021-02-07 DIAGNOSIS — R911 Solitary pulmonary nodule: Secondary | ICD-10-CM | POA: Diagnosis present

## 2021-02-07 DIAGNOSIS — R0781 Pleurodynia: Secondary | ICD-10-CM

## 2021-02-07 DIAGNOSIS — Z20822 Contact with and (suspected) exposure to covid-19: Secondary | ICD-10-CM | POA: Diagnosis present

## 2021-02-07 DIAGNOSIS — M329 Systemic lupus erythematosus, unspecified: Secondary | ICD-10-CM | POA: Diagnosis present

## 2021-02-07 LAB — URINALYSIS, ROUTINE W REFLEX MICROSCOPIC
Bilirubin Urine: NEGATIVE
Glucose, UA: NEGATIVE mg/dL
Hgb urine dipstick: NEGATIVE
Ketones, ur: NEGATIVE mg/dL
Nitrite: NEGATIVE
Protein, ur: NEGATIVE mg/dL
Specific Gravity, Urine: 1.025 (ref 1.005–1.030)
pH: 5.5 (ref 5.0–8.0)

## 2021-02-07 LAB — ECHOCARDIOGRAM COMPLETE
AR max vel: 2.78 cm2
AV Area VTI: 2.7 cm2
AV Area mean vel: 2.69 cm2
AV Mean grad: 3 mmHg
AV Peak grad: 4.7 mmHg
Ao pk vel: 1.08 m/s
Area-P 1/2: 2.85 cm2
MV VTI: 1.99 cm2
S' Lateral: 3.55 cm

## 2021-02-07 LAB — BLOOD CULTURE ID PANEL (REFLEXED) - BCID2

## 2021-02-07 LAB — BASIC METABOLIC PANEL
Anion gap: 6 (ref 5–15)
BUN: 15 mg/dL (ref 6–20)
CO2: 23 mmol/L (ref 22–32)
Calcium: 8.4 mg/dL — ABNORMAL LOW (ref 8.9–10.3)
Chloride: 105 mmol/L (ref 98–111)
Creatinine, Ser: 0.72 mg/dL (ref 0.44–1.00)
GFR, Estimated: >60 mL/min (ref >60)
Glucose, Bld: 112 mg/dL — ABNORMAL HIGH (ref 70–99)
Potassium: 3.7 mmol/L (ref 3.5–5.1)
Sodium: 134 mmol/L — ABNORMAL LOW (ref 135–145)

## 2021-02-07 LAB — POC URINE PREG, ED: Preg Test, Ur: NEGATIVE

## 2021-02-07 LAB — URINALYSIS, MICROSCOPIC (REFLEX): Bacteria, UA: NONE SEEN

## 2021-02-07 LAB — CBC
HCT: 29.3 % — ABNORMAL LOW (ref 36.0–46.0)
Hemoglobin: 9.1 g/dL — ABNORMAL LOW (ref 12.0–15.0)
MCH: 25.3 pg — ABNORMAL LOW (ref 26.0–34.0)
MCHC: 31.1 g/dL (ref 30.0–36.0)
MCV: 81.4 fL (ref 80.0–100.0)
Platelets: 344 10*3/uL (ref 150–400)
RBC: 3.6 MIL/uL — ABNORMAL LOW (ref 3.87–5.11)
RDW: 15.7 % — ABNORMAL HIGH (ref 11.5–15.5)
WBC: 7.2 10*3/uL (ref 4.0–10.5)
nRBC: 0 % (ref 0.0–0.2)

## 2021-02-07 LAB — LACTIC ACID, PLASMA: Lactic Acid, Venous: 1 mmol/L (ref 0.5–1.9)

## 2021-02-07 LAB — TROPONIN I (HIGH SENSITIVITY)
Troponin I (High Sensitivity): 3 ng/L (ref <18)
Troponin I (High Sensitivity): 3 ng/L (ref <18)

## 2021-02-07 LAB — RESP PANEL BY RT-PCR (FLU A&B, COVID) ARPGX2
Influenza A by PCR: NEGATIVE
Influenza B by PCR: NEGATIVE
SARS Coronavirus 2 by RT PCR: NEGATIVE

## 2021-02-07 LAB — PROCALCITONIN: Procalcitonin: <0.1 ng/mL

## 2021-02-07 IMAGING — CT CT ANGIO CHEST
2 of 7 series · 18 of 46 positions shown · IV contrast (APPLIED)
Comparison: None

CLINICAL DATA: PE suspected, high prob. IV drug use with history of
endocarditis now with multifocal abscess development

EXAM:
CT ANGIOGRAPHY CHEST WITH CONTRAST
TECHNIQUE: Multidetector CT imaging of the chest was performed using the
standard protocol during bolus administration of intravenous
contrast. Multiplanar CT image reconstructions and MIPs were
obtained to evaluate the vascular anatomy.
CONTRAST:  75mL OMNIPAQUE IOHEXOL 350 MG/ML SOLN

[Series 5: thins · axial · 0.74mm/px · z∈[-633,-398]mm · 15 of 328 slices shown]
[im 17/328  lung]
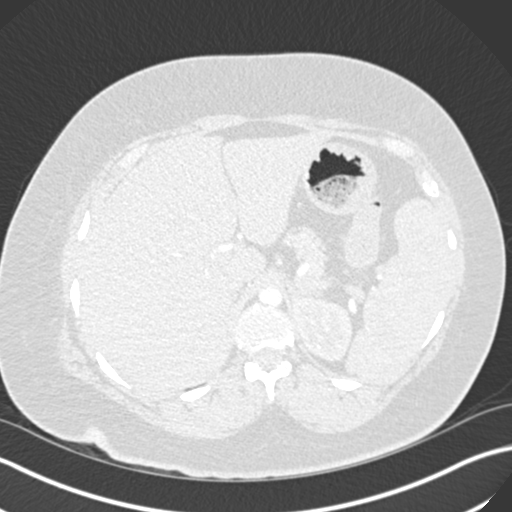
[im 33/328  soft-tissue]
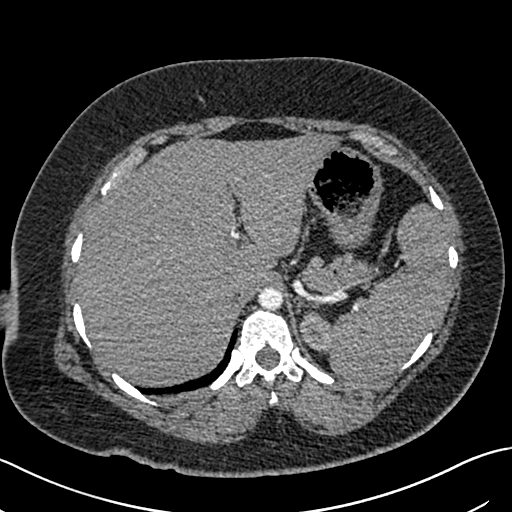
[im 66/328  lung]
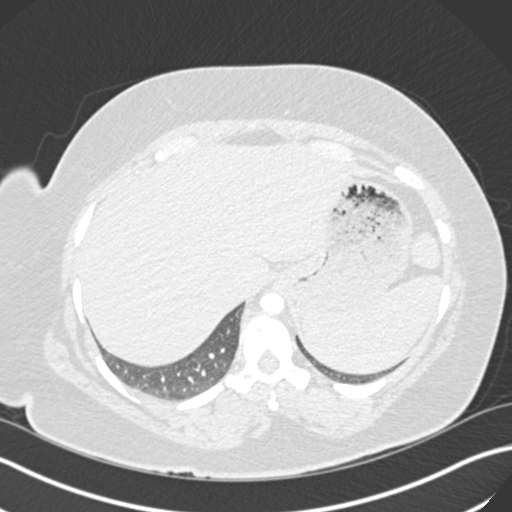
[im 82/328  soft-tissue]
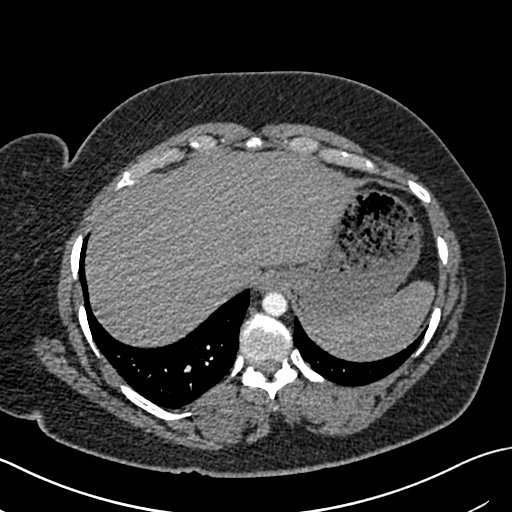
[im 99/328  lung]
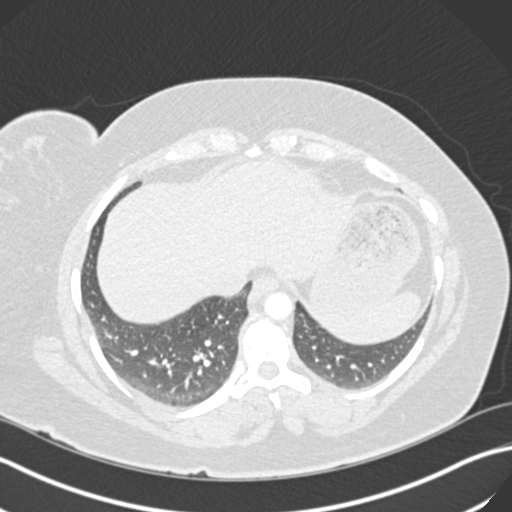
[im 115/328  soft-tissue]
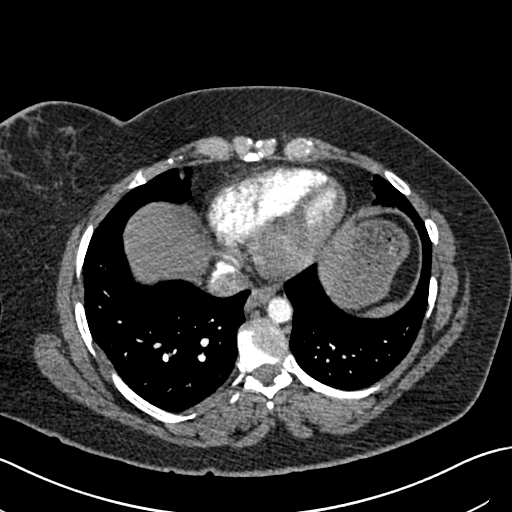
[im 148/328  lung]
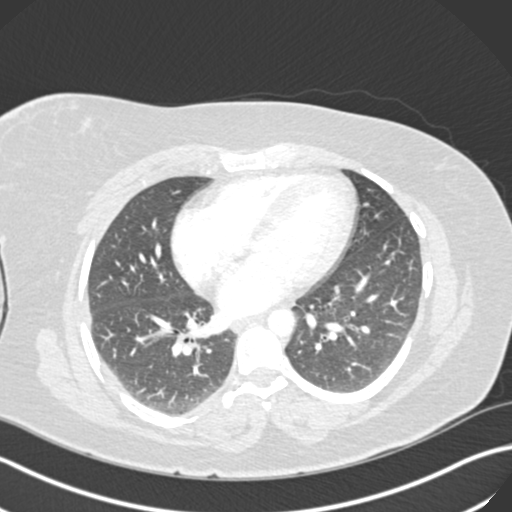
[im 164/328  soft-tissue]
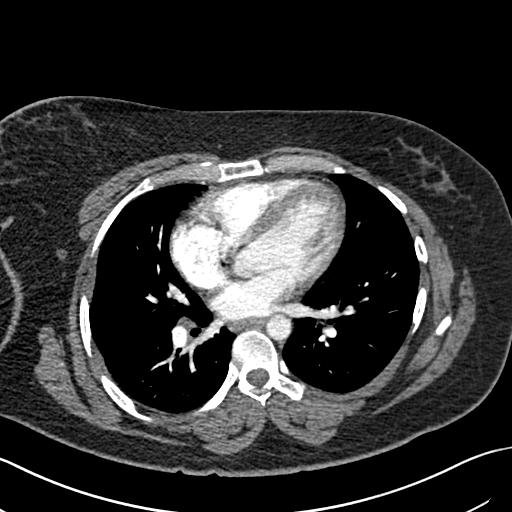
[im 180/328  lung]
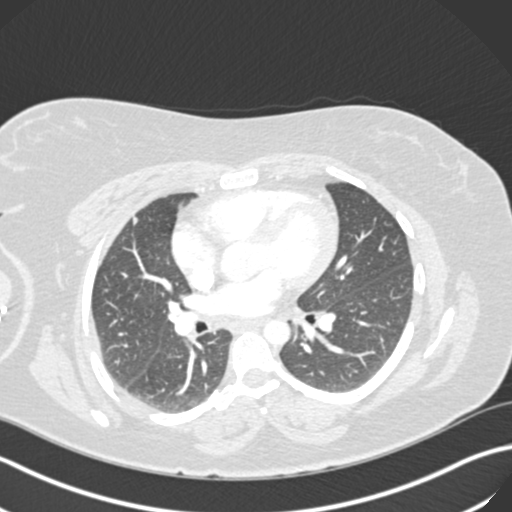
[im 213/328  soft-tissue]
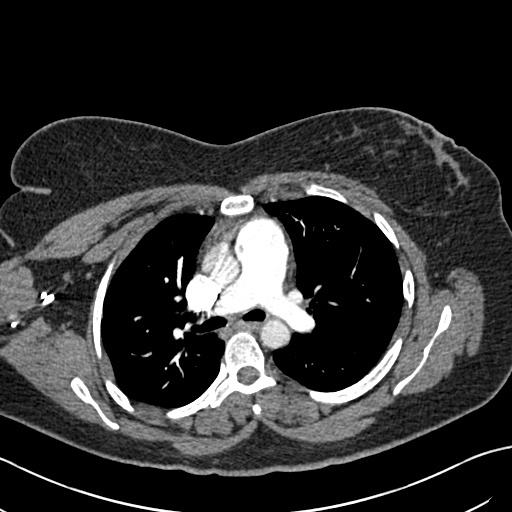
[im 229/328  lung]
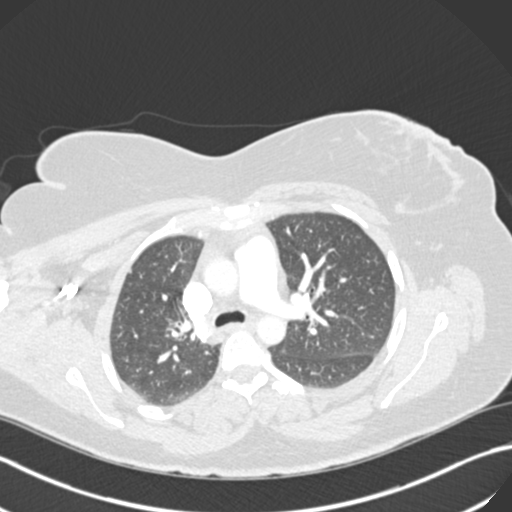
[im 246/328  soft-tissue]
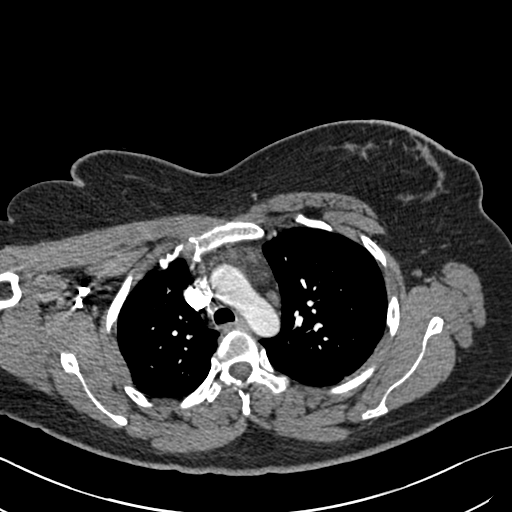
[im 262/328  lung]
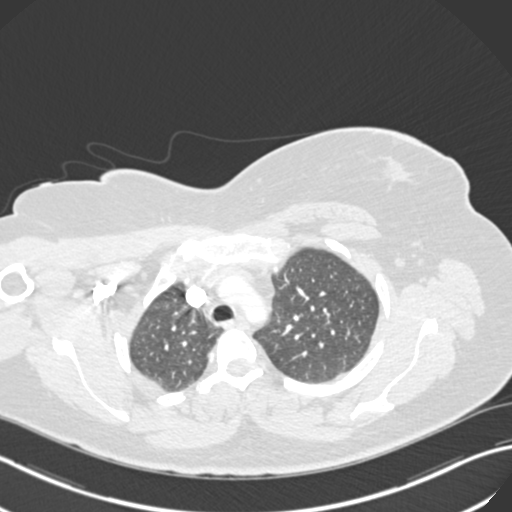
[im 295/328  soft-tissue]
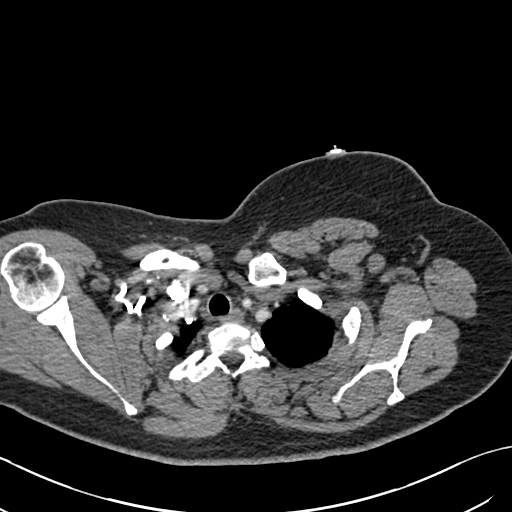
[im 311/328  lung]
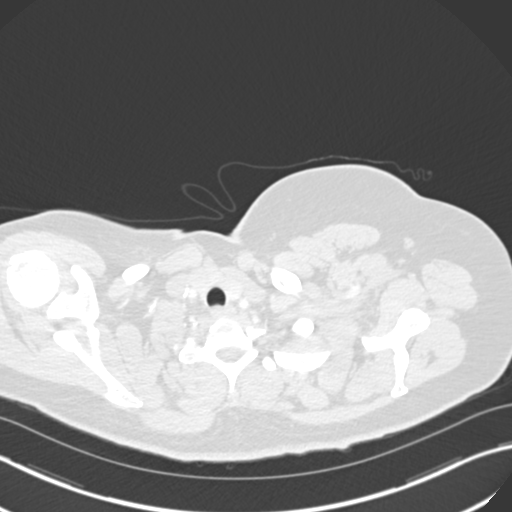

[Series 7: coronal mpr · coronal · 0.53mm/px · 3 of 82 slices shown]
[im 21/82  soft-tissue]
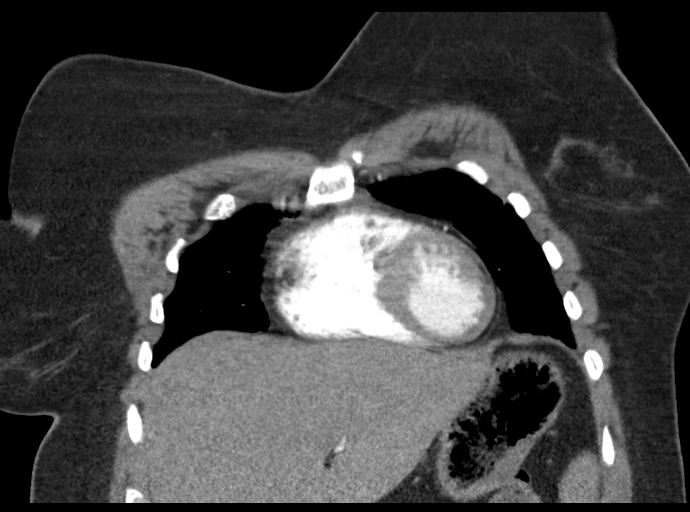
[im 41/82  soft-tissue]
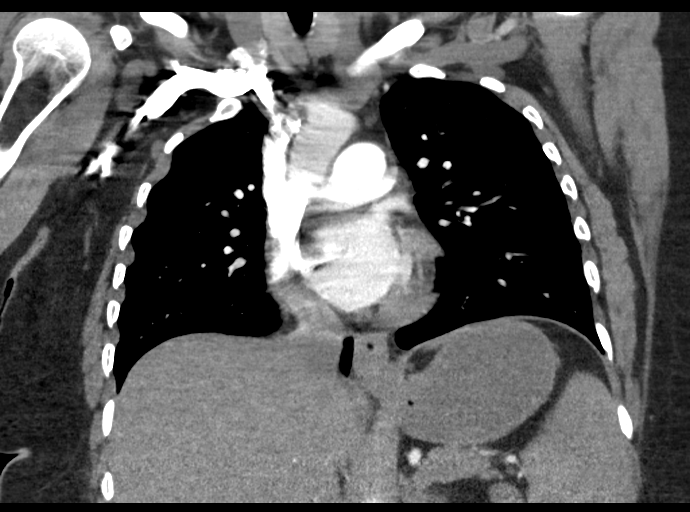
[im 61/82  soft-tissue]
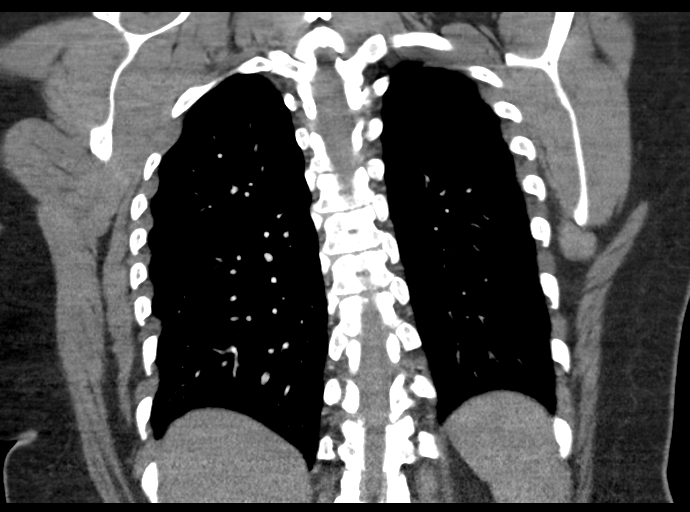

[18 of 46 positions shown; findings below may reference images not displayed]

FINDINGS: Cardiovascular: Satisfactory opacification of the pulmonary arteries
to the segmental level. No evidence of pulmonary embolism. Normal
heart size. No pericardial effusion.

Mediastinum/Nodes: A single pathologically enlarged left subpectoral
lymph node is identified measuring 15 x 22 mm at axial image # [DATE].
No other pathologic thoracic adenopathy. Thyroid unremarkable.
Esophagus is unremarkable.

Lungs/Pleura: 5 mm subpleural indeterminate pulmonary nodule noted
within the a subpleural right upper lobe at axial image # 41/6. The
lungs are otherwise clear. No pneumothorax or pleural effusion.

Upper Abdomen: No acute abnormality.

Musculoskeletal: No chest wall abnormality. No acute or significant
osseous findings.

Review of the MIP images confirms the above findings.
IMPRESSION: No pulmonary embolism.

Isolated, pathologically enlarged left subpectoral lymph node. Given
the patient's history, this may be reactive in nature. However,
correlation with left breast examination and possible mammography
may be helpful for further evaluation.

5 mm indeterminate right upper lobe pulmonary nodule. No follow-up
needed if patient is low-risk. Non-contrast chest CT can be
considered in 12 months if patient is high-risk. This recommendation
follows the consensus statement: Guidelines for Management of
Incidental Pulmonary Nodules Detected on CT Images: From the

## 2021-02-07 MED ORDER — VANCOMYCIN HCL IN DEXTROSE 1-5 GM/200ML-% IV SOLN: 1000.0000 mg | Freq: Once | INTRAVENOUS | Status: DC

## 2021-02-07 MED ORDER — VANCOMYCIN HCL 750 MG/150ML IV SOLN
750.0000 mg | Freq: Two times a day (BID) | INTRAVENOUS | Status: DC
  Administered 2021-02-07 – 2021-02-08 (×2): 750 mg via INTRAVENOUS
  Filled 2021-02-07 (×4): qty 150

## 2021-02-07 MED ORDER — SODIUM CHLORIDE 0.9 % IV SOLN
2.0000 g | Freq: Once | INTRAVENOUS | Status: AC
  Administered 2021-02-07: 2 g via INTRAVENOUS
  Filled 2021-02-07: qty 2

## 2021-02-07 MED ORDER — NICOTINE POLACRILEX 2 MG MT GUM
2.0000 mg | CHEWING_GUM | OROMUCOSAL | Status: DC | PRN
  Administered 2021-02-07: 2 mg via ORAL
  Filled 2021-02-07 (×7): qty 1

## 2021-02-07 MED ORDER — VANCOMYCIN HCL 2000 MG/400ML IV SOLN
2000.0000 mg | Freq: Once | INTRAVENOUS | Status: AC
  Administered 2021-02-07: 2000 mg via INTRAVENOUS
  Filled 2021-02-07 (×2): qty 400

## 2021-02-07 MED ORDER — LACTATED RINGERS IV BOLUS
1000.0000 mL | Freq: Once | INTRAVENOUS | Status: AC
  Administered 2021-02-07: 1000 mL via INTRAVENOUS

## 2021-02-07 MED ORDER — ENOXAPARIN SODIUM 40 MG/0.4ML IJ SOSY: 40.0000 mg | PREFILLED_SYRINGE | INTRAMUSCULAR | Status: DC

## 2021-02-07 MED ORDER — SODIUM CHLORIDE 0.9 % IV SOLN: 2.0000 g | Freq: Once | INTRAVENOUS | Status: DC

## 2021-02-07 MED ORDER — ACETAMINOPHEN 325 MG PO TABS
650.0000 mg | ORAL_TABLET | Freq: Four times a day (QID) | ORAL | Status: DC | PRN
  Administered 2021-02-09: 650 mg via ORAL
  Filled 2021-02-07: qty 2

## 2021-02-07 MED ORDER — INFLUENZA VAC SPLIT QUAD 0.5 ML IM SUSY: 0.5000 mL | PREFILLED_SYRINGE | INTRAMUSCULAR | Status: DC

## 2021-02-07 MED ORDER — QUETIAPINE FUMARATE 25 MG PO TABS
100.0000 mg | ORAL_TABLET | Freq: Every day | ORAL | Status: DC
  Administered 2021-02-07 – 2021-02-09 (×3): 100 mg via ORAL
  Filled 2021-02-07 (×3): qty 4

## 2021-02-07 MED ORDER — METHADONE HCL 10 MG/ML PO CONC
100.0000 mg | Freq: Every morning | ORAL | Status: DC
  Administered 2021-02-07 – 2021-02-10 (×4): 100 mg via ORAL
  Filled 2021-02-07 (×5): qty 10

## 2021-02-07 MED ORDER — MAGNESIUM HYDROXIDE 400 MG/5ML PO SUSP: 30.0000 mL | Freq: Every day | ORAL | Status: DC | PRN

## 2021-02-07 MED ORDER — SODIUM CHLORIDE 0.9 % IV SOLN
2.0000 g | Freq: Three times a day (TID) | INTRAVENOUS | Status: DC
  Administered 2021-02-07 – 2021-02-08 (×3): 2 g via INTRAVENOUS
  Filled 2021-02-07 (×5): qty 2

## 2021-02-07 MED ORDER — TRAZODONE HCL 50 MG PO TABS
25.0000 mg | ORAL_TABLET | Freq: Every evening | ORAL | Status: DC | PRN
  Administered 2021-02-07 – 2021-02-09 (×3): 25 mg via ORAL
  Filled 2021-02-07 (×3): qty 1

## 2021-02-07 MED ORDER — IOHEXOL 350 MG/ML SOLN
75.0000 mL | Freq: Once | INTRAVENOUS | Status: AC | PRN
  Administered 2021-02-07: 75 mL via INTRAVENOUS

## 2021-02-07 MED ORDER — ENOXAPARIN SODIUM 60 MG/0.6ML IJ SOSY: 0.5000 mg/kg | PREFILLED_SYRINGE | INTRAMUSCULAR | Status: DC

## 2021-02-07 MED ORDER — ACETAMINOPHEN 500 MG PO TABS
1000.0000 mg | ORAL_TABLET | Freq: Once | ORAL | Status: AC
  Administered 2021-02-07: 1000 mg via ORAL
  Filled 2021-02-07: qty 2

## 2021-02-07 MED ORDER — PRAZOSIN HCL 2 MG PO CAPS
2.0000 mg | ORAL_CAPSULE | Freq: Every day | ORAL | Status: DC
  Administered 2021-02-07 – 2021-02-08 (×2): 2 mg via ORAL
  Filled 2021-02-07 (×5): qty 1

## 2021-02-07 MED ORDER — ACETAMINOPHEN 650 MG RE SUPP: 650.0000 mg | Freq: Four times a day (QID) | RECTAL | Status: DC | PRN

## 2021-02-07 NOTE — ED Notes (Signed)
Patient to CT.

## 2021-02-07 NOTE — Progress Notes (Signed)
*  PRELIMINARY RESULTS* Echocardiogram 2D Echocardiogram has been performed.  Joanette Gula Brydon Spahr 02/07/2021, 12:06 PM

## 2021-02-07 NOTE — Progress Notes (Signed)
Review of consult for IV; noted IV obtained by ED. Consult cleared.

## 2021-02-07 NOTE — ED Notes (Signed)
Patient is asking about her Seroquel 100mg , and daily Methadone dose of 100mg . States she goes to in [place]. Dr. [employer] messaged regarding these medications.

## 2021-02-07 NOTE — ED Provider Notes (Signed)
[facility] Emergency Department Provider Note  ____________________________________________  Time seen: Approximately 2:52 AM  I have reviewed the triage vital signs and the nursing notes.   HISTORY  Chief Complaint Fever, Chills, and Cough   HPI Karen Clarke is a 35 y.o. 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 Patient reports that she has had several skin infections all over her body. There is a large open wound on the left shin which is now draining purulent foul smelling secretions. She has had chills, fever, and night sweats for the last few weeks. Describes a sharp pleuritic chest pain that has been intermittent. Also has had a cough for one month. She is an active IVDU and injects fentanyl and cocaine. She reports that she buys her needles at [facility] and does not share them.  Past Medical History:  Diagnosis Date   IgA deficiency ([place])    Lupus ([place])     There are no problems to display for this patient.   History reviewed. No pertinent surgical history.  Prior to Admission medications   Medication Sig Start Date End Date Taking? Authorizing Provider  methadone (DOLOPHINE) 10 MG/ML solution Take by mouth.   Yes [provider]  prazosin (MINIPRESS) 2 MG capsule Take 2 mg by mouth at bedtime. 12/13/20  Yes [provider]  QUEtiapine (SEROQUEL) 100 MG tablet Take 100 mg by mouth at bedtime. 12/30/20  Yes [provider]  citalopram (CELEXA) 20 MG tablet Take 20 mg by mouth at bedtime. Patient not taking: Reported on 02/06/2021 10/03/20   [provider]    Allergies Amoxicillin and Capsaicin  No family history on file.  Social History Social History   Tobacco Use   Smoking status: Every Day    Types: Cigarettes   Smokeless tobacco: Never  Substance Use Topics   Alcohol  use: Never   Drug use: Never    Review of Systems  Constitutional: + fever, chills, nightsweats Eyes: Negative for visual changes. ENT: Negative for sore throat. Neck: No neck pain  Cardiovascular: + chest pain. Respiratory: Negative for shortness of breath. + cough Gastrointestinal: Negative for abdominal pain, vomiting or diarrhea. Genitourinary: Negative for dysuria. Musculoskeletal: Negative for back pain. + LLE wound Skin: Negative for rash. + skin infections Neurological: Negative for headaches, weakness or numbness. Psych: No SI or HI  ____________________________________________   PHYSICAL EXAM:  VITAL SIGNS: ED Triage Vitals  Enc Vitals Group     BP 02/06/21 2151 (!) 143/84     Pulse Rate 02/06/21 2151 91     Resp 02/06/21 2151 18     Temp 02/06/21 2151 98.7 F (37.1 C)     Temp Source 02/07/21 0044 Oral     SpO2 02/06/21 2151 100 %     Weight 02/06/21 2056 200 lb (90.7 kg)     Height 02/06/21 2056 5\' 3"  (1.6 m)     Head Circumference --      Peak Flow --      Pain Score 02/06/21 2056 4     Pain Loc --      Pain Edu? --      Excl. in [place]? --     Constitutional: Alert and oriented. Well appearing and in no apparent distress. HEENT:      Head: Normocephalic and atraumatic.         Eyes:  Conjunctivae are normal. Sclera is non-icteric.       Mouth/Throat: Mucous membranes are moist.       Neck: Supple with no signs of meningismus. Cardiovascular: Regular rate and rhythm. No murmurs, gallops, or rubs. 2+ symmetrical distal pulses are present in all extremities. No JVD. Respiratory: Normal respiratory effort. Lungs are clear to auscultation bilaterally.  Gastrointestinal: Soft, non tender, and non distended with positive bowel sounds. No rebound or guarding. Musculoskeletal:  No edema, cyanosis, or erythema of extremities. Large open wound on the left shin with fouls smelling purulent discharge Neurologic: Normal speech and language. Face is symmetric. Moving  all extremities. No gross focal neurologic deficits are appreciated. Skin: Skin is warm, dry and intact. Patient has several area all over her body of cellulitis and some with induration but no abscess requiring draining at this time. Several track marks.  Psychiatric: Mood and affect are normal. Speech and behavior are normal.  ____________________________________________   LABS (all labs ordered are listed, but only abnormal results are displayed)  Labs Reviewed  LACTIC ACID, PLASMA - Abnormal; Notable for the following components:      Result Value   Lactic Acid, Venous 2.0 (*)    All other components within normal limits  COMPREHENSIVE METABOLIC PANEL - Abnormal; Notable for the following components:   Sodium 133 (*)    Glucose, Bld 103 (*)    BUN 21 (*)    Alkaline Phosphatase 147 (*)    All other components within normal limits  CBC WITH DIFFERENTIAL/PLATELET - Abnormal; Notable for the following components:   WBC 10.9 (*)    Hemoglobin 10.3 (*)    HCT 33.6 (*)    MCH 25.1 (*)    Platelets 401 (*)    Neutro Abs 8.2 (*)    All other components within normal limits  URINALYSIS, ROUTINE W REFLEX MICROSCOPIC - Abnormal; Notable for the following components:   APPearance HAZY (*)    Leukocytes,Ua SMALL (*)    All other components within normal limits  RESP PANEL BY RT-PCR (FLU A&B, COVID) ARPGX2  CULTURE, BLOOD (ROUTINE X 2)  CULTURE, BLOOD (ROUTINE X 2)  AEROBIC CULTURE W GRAM STAIN (SUPERFICIAL SPECIMEN)  LACTIC ACID, PLASMA  PROCALCITONIN  URINALYSIS, MICROSCOPIC (REFLEX)  POC URINE PREG, ED   ____________________________________________  EKG  ED ECG REPORT I, Nita Sickle, the attending physician, personally viewed and interpreted this ECG.  Sinus tachycardia, rate 101, normal intervals, normal axis, no STE or depression  ____________________________________________  RADIOLOGY  I have personally reviewed the images performed during this visit and I agree  with the Radiologist's read.   Interpretation by Radiologist:  DG Chest 2 View  Result Date: 02/06/2021 CLINICAL DATA:  Cough and fever EXAM: CHEST - 2 VIEW COMPARISON:  09/06/2007 FINDINGS: The heart size and mediastinal contours are within normal limits. Both lungs are clear. The visualized skeletal structures are unremarkable. IMPRESSION: No active cardiopulmonary disease. Electronically Signed   By: Jasmine Pang M.D.   On: 02/06/2021 21:22   CT Angio Chest PE W and/or Wo Contrast  Result Date: 02/07/2021 CLINICAL DATA:  PE suspected, high prob. 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 Multiplanar CT image reconstructions and MIPs were obtained to evaluate the vascular anatomy. CONTRAST:  101mL OMNIPAQUE IOHEXOL 350 MG/ML SOLN COMPARISON:  None FINDINGS:  Cardiovascular: Satisfactory opacification of the pulmonary arteries to the segmental level. No evidence of pulmonary embolism. Normal heart size. No pericardial effusion. Mediastinum/Nodes: A single pathologically enlarged left subpectoral lymph node is identified measuring 15 x 22 mm at axial image # 7/4. No other pathologic thoracic adenopathy. Thyroid unremarkable. Esophagus is unremarkable. Lungs/Pleura: 5 mm subpleural indeterminate pulmonary nodule noted within the a subpleural right upper lobe at axial image # 41/6. The lungs are otherwise clear. No pneumothorax or pleural effusion. Upper Abdomen: No acute abnormality. Musculoskeletal: No chest wall abnormality. No acute or significant osseous findings. Review of the MIP images confirms the above findings. IMPRESSION: No pulmonary embolism. Isolated, pathologically enlarged left subpectoral lymph node. Given the patient's history, this may be reactive in nature. However,  correlation with left breast examination and possible mammography may be helpful for further evaluation. 5 mm indeterminate right upper lobe pulmonary nodule. No follow-up needed if patient is low-risk. Non-contrast chest CT can be considered in 12 months if patient is high-risk. This recommendation follows the consensus statement: Guidelines for Management of Incidental Pulmonary Nodules Detected on CT Images: From the Fleischner Society 2017; Radiology 2017; 284:228-243. Electronically Signed   By: Fidela Salisbury M.D.   On: 02/07/2021 02:32     ____________________________________________   PROCEDURES  Procedure(s) performed:yes .1-3 Lead EKG Interpretation Performed by: Rudene Re, MD Authorized by: Rudene Re, MD     Interpretation: non-specific     ECG rate assessment: tachycardic     Rhythm: sinus tachycardia     Ectopy: none     Conduction: normal     Critical Care performed:  None ____________________________________________   INITIAL IMPRESSION / ASSESSMENT AND PLAN / ED COURSE   35 y.o. 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  Patient does not meet sepsis criteria but has several areas of skin infection throughout her body. No large abscess requiring I&D. Large open wound to the L shin with purulent discharge, swab sent for culture. Patient with sites of infection even in areas where she does not inject concerning for hematogenous spread. Blood cultures sent. Patient given vancomycin and cefepime. CT of the chest with no signs of lung infection or septic emboli. Will consult hospitalist for admission for IV abx and wound care. Old records reviewed including prior admissions for bacteremia and endocarditis. Patient will most likely need an echo in the am to rule out endocarditis.       _____________________________________________ Please note:  Patient was evaluated in Emergency Department today for the symptoms described in the history of present illness. Patient was evaluated in the context of the global COVID-19 pandemic, which necessitated consideration that the patient might be at risk for infection with the SARS-CoV-2 virus that causes COVID-19. Institutional protocols and algorithms that pertain to the evaluation of patients at risk for COVID-19 are in a state of rapid change based on information released by regulatory bodies including the CDC and federal and state organizations. These policies and algorithms were followed during the patient's care in the ED.  Some ED evaluations and interventions may be delayed as a result of limited staffing during the pandemic.   [place] Controlled Substance Database was reviewed by me. ____________________________________________   FINAL CLINICAL IMPRESSION(S) / ED DIAGNOSES   Final diagnoses:  IVDU (intravenous drug user)  Cellulitis, unspecified cellulitis site  Wound of left lower extremity, initial encounter  NEW MEDICATIONS STARTED DURING THIS VISIT:  ED Discharge Orders     None        Note:  This document was prepared using Dragon voice recognition software and may include unintentional dictation errors.    Rudene Re, MD 02/07/21 [phone]

## 2021-02-07 NOTE — ED Notes (Signed)
Ambulatory to bsc without assistance to void. No complaints. Refused [employer]. Ate grahm crackers and gingerale. Tolerated well.

## 2021-02-07 NOTE — H&P (Signed)
[place]   PATIENT NAME: Karen Clarke    MR#:  [id number]  DATE OF BIRTH:  05-17-1985  DATE OF ADMISSION:  02/07/2021  PRIMARY CARE PHYSICIAN: Patient, No Pcp Per (Inactive)   Patient is coming from: Home  REQUESTING/REFERRING PHYSICIAN: Nita Sickle, MD  CHIEF COMPLAINT:   Chief Complaint  Patient presents with  . Fever  . Chills  . Cough    HISTORY OF PRESENT ILLNESS:  Karen Clarke is a 35 y.o. 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  She admits to night sweats and with her fever and chills.  She has been having occasional nausea and vomiting.  She denies any abdominal pain.  She describes a sharp pleuritic chest pain which has been intermittent.  She admits to injecting cocaine and fentanyl.  She used to inject heroin and has quit 3 to 4 months ago.  She is currently on methadone followed at [facility] clinic.  No dysuria, oliguria or hematuria or flank pain.  No bleeding diathesis.  ED Course: When she came to the ER, BP was 143/84 with otherwise normal vital signs.  Labs revealed a sodium of 134 and CBC showed anemia slightly worse than previous levels.  She had no leukocytosis.  Urine pregnancy test was negative. EKG as reviewed by me : EKG showed mild sinus tachycardia with rate 101 with flattened T waves in V1 and V3.  Imaging: 2 view chest ray showed no acute cardiopulmonary disease.  Chest CTA revealed the following: No pulmonary embolism.   Isolated, pathologically enlarged left subpectoral lymph node. Given the patient's history, this may be reactive in  nature. However, correlation with left breast examination and possible mammography may be helpful for further evaluation.   5 mm indeterminate right upper lobe pulmonary nodule. No follow-up needed if patient is low-risk. Non-contrast chest CT can be considered in 12 months if patient is high-risk   PAST MEDICAL HISTORY:   Past Medical History:  Diagnosis Date  . IgA deficiency (HCC)   . Lupus (HCC)     PAST SURGICAL HISTORY:  History reviewed. No pertinent surgical history.  SOCIAL HISTORY:   Social History   Tobacco Use  . Smoking status: Every Day    Types: Cigarettes  . Smokeless tobacco: Never  Substance Use Topics  . Alcohol use: Never    FAMILY HISTORY:  No family history on file.  DRUG ALLERGIES:   Allergies  Allergen Reactions  . Amoxicillin   . Capsaicin Hives and Itching    REVIEW OF SYSTEMS:   ROS As per history of present illness. All pertinent systems were reviewed above. Constitutional, HEENT, cardiovascular, respiratory, GI, GU, musculoskeletal, neuro, psychiatric, endocrine, integumentary and hematologic systems were reviewed and are otherwise negative/unremarkable except for positive findings mentioned above in the HPI.   MEDICATIONS AT HOME:   Prior to Admission medications   Medication Sig Start Date End Date Taking? Authorizing Provider  methadone (DOLOPHINE) 10 MG/ML solution Take by mouth.   Yes [provider]  prazosin (MINIPRESS) 2 MG capsule Take 2 mg by mouth at bedtime. 12/13/20  Yes [provider]  QUEtiapine (  SEROQUEL) 100 MG tablet Take 100 mg by mouth at bedtime. 12/30/20  Yes [provider]  citalopram (CELEXA) 20 MG tablet Take 20 mg by mouth at bedtime. Patient not taking: Reported on 02/06/2021 10/03/20   [provider]      VITAL SIGNS:  Blood pressure (!) 124/59, pulse 84, temperature 98.6 F (37 C), temperature source Oral, resp. rate 15, height 5\' 3"  (1.6 m), weight 90.7 kg, SpO2  100 %.  PHYSICAL EXAMINATION:  Physical Exam  GENERAL:  35 y.o.-year-old Caucasian female patient lying in the bed with no acute distress.  EYES: Pupils equal, round, reactive to light and accommodation. No scleral icterus. Extraocular muscles intact.  HEENT: Head atraumatic, normocephalic. Oropharynx and nasopharynx clear.  NECK:  Supple, no jugular venous distention. No thyroid enlargement, no tenderness.  LUNGS: Normal breath sounds bilaterally, no wheezing, rales,rhonchi or crepitation. No use of accessory muscles of respiration.  CARDIOVASCULAR: Regular rate and rhythm, S1, S2 normal. No murmurs, rubs, or gallops.  ABDOMEN: Soft, nondistended, nontender. Bowel sounds present. No organomegaly or mass.  EXTREMITIES: No pedal edema, cyanosis, or clubbing.  NEUROLOGIC: Cranial nerves II through XII are intact. Muscle strength 5/5 in all extremities. Sensation intact. Gait not checked.  PSYCHIATRIC: The patient is alert and oriented x 3.  Normal affect and good eye contact. SKIN: The patient has a left shin large ulcer with purulent drainage and surrounding erythema and induration with tenderness and multiple scattered erythematous area with scabbed ulcerations, some of them in the upper extremities at the site of drug injections.     LABORATORY PANEL:   CBC Recent Labs  Lab 02/06/21 2145  WBC 10.9*  HGB 10.3*  HCT 33.6*  PLT 401*   ------------------------------------------------------------------------------------------------------------------  Chemistries  Recent Labs  Lab 02/06/21 2145  NA 133*  K 4.8  CL 103  CO2 23  GLUCOSE 103*  BUN 21*  CREATININE 0.96  CALCIUM 8.9  AST 25  ALT 26  ALKPHOS 147*  BILITOT 0.3   ------------------------------------------------------------------------------------------------------------------  Cardiac Enzymes No results for input(s): TROPONINI in the last 168  hours. ------------------------------------------------------------------------------------------------------------------  RADIOLOGY:  DG Chest 2 View  Result Date: 02/06/2021 CLINICAL DATA:  Cough and fever EXAM: CHEST - 2 VIEW COMPARISON:  09/06/2007 FINDINGS: The heart size and mediastinal contours are within normal limits. Both lungs are clear. The visualized skeletal structures are unremarkable. IMPRESSION: No active cardiopulmonary disease. Electronically Signed   By: Donavan Foil M.D.   On: 02/06/2021 21:22   CT Angio Chest PE W and/or Wo Contrast  Result Date: 02/07/2021 CLINICAL DATA:  PE suspected, high prob. 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 Multiplanar CT image reconstructions and MIPs were obtained to evaluate the vascular anatomy. CONTRAST:  29mL OMNIPAQUE IOHEXOL 350 MG/ML SOLN COMPARISON:  None FINDINGS: Cardiovascular: Satisfactory opacification of the pulmonary arteries to the segmental level. No evidence of pulmonary embolism. Normal heart size. No pericardial effusion. Mediastinum/Nodes: A single pathologically enlarged left subpectoral lymph node is identified measuring 15 x 22 mm at axial image # 7/4. No other pathologic thoracic adenopathy. Thyroid unremarkable. Esophagus is unremarkable. Lungs/Pleura: 5 mm subpleural indeterminate pulmonary nodule noted within the a subpleural right upper lobe at axial image # 41/6. The lungs are otherwise clear. No pneumothorax or pleural effusion. Upper Abdomen: No acute abnormality. Musculoskeletal: No chest  wall abnormality. No acute or significant osseous findings. Review of the MIP images confirms the above findings. IMPRESSION: No pulmonary embolism. Isolated, pathologically enlarged left subpectoral lymph node.  Given the patient's history, this may be reactive in nature. However, correlation with left breast examination and possible mammography may be helpful for further evaluation. 5 mm indeterminate right upper lobe pulmonary nodule. No follow-up needed if patient is low-risk. Non-contrast chest CT can be considered in 12 months if patient is high-risk. This recommendation follows the consensus statement: Guidelines for Management of Incidental Pulmonary Nodules Detected on CT Images: From the Fleischner Society 2017; Radiology 2017; 284:228-243. Electronically Signed   By: Fidela Salisbury M.D.   On: 02/07/2021 02:32      IMPRESSION AND PLAN:  Principal Problem:   Cellulitis of right lower extremity  1.  Left lower extremity cellulitis due to infected shin ulcer with scattered skin pustular scabbed and ulcerated erythematous eruptions partly at the site of IV drug injections.  She has subjective fever and chills.  She has been having cough and pleuritic chest pain.  Chest CTA was negative for PE. - The patient will be admitted to a medical monitored bed. - We will continue antibiotic therapy with IV cefepime and vancomycin. - We will follow blood cultures. - Pain management will be provided. - We will follow serial troponins. - We will obtain a 2D echo to assess for any vegetations.  She did not have any audible murmurs that I could have appreciated but I had history of endocarditis twice. - She may need an ID consult.  Will defer to a.m. hospitalist.  2.  History of IV drug abuse. - We will continue her methadone.  3.  Depression. - We will continue her Celexa and Seroquel.   DVT prophylaxis: Lovenox. Code Status: full code. Family Communication:  The plan of care was discussed in details with the patient (and family). I answered all questions. The patient agreed to proceed with the above mentioned plan. Further management will depend upon hospital course. Disposition Plan: Back to previous  home environment Consults called: none. All the records are reviewed and case discussed with ED provider.  Status is: Inpatient  Remains inpatient appropriate because:Ongoing diagnostic testing needed not appropriate for outpatient work up, Unsafe d/c plan, IV treatments appropriate due to intensity of illness or inability to take PO, and Inpatient level of care appropriate due to severity of illness   Dispo: The patient is from: Home              Anticipated d/c is to: Home              Patient currently is not medically stable to d/c.              Difficult to place patient: No  TOTAL TIME TAKING CARE OF THIS PATIENT: 55 minutes.     Christel Mormon M.D on 02/07/2021 at 3:13 AM  [facility]   From 7 PM-7 AM, contact night-coverage [email or web address]  CC: Primary care physician; Patient, No Pcp Per (Inactive)

## 2021-02-07 NOTE — ED Notes (Signed)
Hospitalist at bedside 

## 2021-02-07 NOTE — Consult Note (Signed)
NAME: Karen Clarke  DOB: 1985/12/05  MRN: [id number]  Date/Time: 02/07/2021 1:39 PM  REQUESTING PROVIDER: Dr. Si Raider Subjective:  REASON FOR CONSULT: IVDA with history of endocarditis ? Karen Clarke is a 35 y.o. 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  Left [facility] AMA prior to completion of IV cefazolin.  In February 2022 she grew MRSA on repeat blood cultures TTE/TEE without evidence of vegetation but still met Dukes criteria for endocarditis with pulmonary septic emboli, fever with IVDU and positive blood cultures.  She supposed to have received vancomycin followed by dalbavancin but again left prior to completion - received 9 days of antibiotics after clearance of cultures.Prescriptions for doxycycline was sent but this was never filled.  She presented again in March 2022 initially concerning for bacteremia but actually had negative blood cultures.  Ultimately concern for multiple source of infection including bilateral lower extremity wounds, multiple sites of IVDU with overlying erythema consistent with cellulitis and purulent appearance on the face.  ID was consulted during admission and recommended restarting IV antibiotics for endocarditis and other infections for 6 weeks as she was not an OPAT candidate did not simply have safe housing disposition  plan so ultimately received full course of IV vancomycin from 05/21/2020 until 07/02/2020 as inpatient.  Since her discharge she was in good health for 3 to 4 weeks until her presentation when she began experiencing sequence of symptoms consistent with her prior presentation.  Started having pain in her left intrascapular area in the mid back.  So when she went to the and she went to [place] her blood cultures drawn on 10/21 had 1 out of 3 Enterococcus species.  She was asked to come back to the ED. 1 bottle blood culture also came back positive for Candida parapsilosis.  TTE was negative and repeat blood cultures were negative other than coag negative staph which was thought to be a contaminant.  MRI spine was not well-tolerated but overall no concerning finding.  Initially on vancomycin for E faecalis then later transitioned to ampicillin after PCN testing and amoxicillin challenge was negative on 12/27/20.  For candidemia initially she was placed on micafungin when culture resulted she was transitioned to fluconazole and left to complete 14 days of treatment on discharge. She completed PCN skin testing with amoxicillin challenge with no adverse outcome.  Amoxicillin was removed from the allergies.  in on 02/06/2021 in our ED BP 143/84 temperature 98.7, pulse 91, respiratory rate 18, sats 100% WBC 10.9, Hb 10.3, platelet 409 and creatinine 0.96. Blood cultures were sent Vancomycin and cefepime were started. I am asked to see the patient for history of endocarditis Patient says she has had fever and chills for a few days Also has painful swollen lesions on her right forearm and left arm. 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[place])    Lupus ([place])   Endocarditis x2 Bacteremia's time 3 Gonorrhea   Surgical history Amputation right index finger 02/20/2020 Drainage of palmar bursa right  02/20/2020 Tonsillectomy  Social History   Socioeconomic History   Marital status: Single    Spouse name: Not on file   Number of children: Not on file   Years of education: Not on file   Highest education level: Not on file  Occupational History   Not on file  Tobacco Use   Smoking status: Every Day    Types: Cigarettes   Smokeless tobacco: Never  Substance and Sexual Activity   Alcohol use: Never   Drug use: Never   Sexual activity: Not on file  Other Topics Concern   Not on file  Social History Narrative   Not on file   Social Determinants of Health   Financial Resource Strain: Not on file  Food Insecurity: Not on file  Transportation Needs: Not on file  Physical Activity: Not on file  Stress: Not on file  Social Connections: Not on file  Intimate Partner Violence: Not on file  Family history Mother has pacemaker Cardiogram with the mother COPD mother Cancer mother Sick sinus syndrome mother Hypertension Father Cancer maternal grandfather Prostate cancer paternal grandfather Allergies  Allergen Reactions   Amoxicillin    Capsaicin Hives and Itching    patient had a penicillin allergy skin test which was negative at [facility] on 12/27/2020. Current Facility-Administered Medications  Medication Dose Route Frequency Provider Last Rate Last Admin   acetaminophen (TYLENOL) tablet 650 mg  650 mg Oral Q6H PRN Mansy, Jan A, MD       Or   acetaminophen (TYLENOL) suppository 650 mg  650 mg Rectal Q6H PRN Mansy, Jan A, MD       ceFEPIme (MAXIPIME) 2 g in sodium chloride 0.9 % 100 mL IVPB  2 g Intravenous Q8H Mansy, Jan A, MD 200 mL/hr at 02/07/21 1258 2 g at 02/07/21 1258   enoxaparin (LOVENOX) injection 45 mg  0.5 mg/kg Subcutaneous Q24H Mansy, Jan A, MD       magnesium hydroxide (MILK OF MAGNESIA) suspension 30 mL  30 mL Oral Daily PRN Mansy, Jan A, MD       methadone (DOLOPHINE) 10 MG/ML solution 100 mg  100 mg Oral q AM Mansy, Jan A, MD   100 mg at 02/07/21 5625    nicotine polacrilex (NICORETTE) gum 2 mg  2 mg Oral PRN Rudene Re, MD       prazosin (MINIPRESS) capsule 2 mg  2 mg Oral QHS Mansy, Jan A, MD       QUEtiapine (SEROQUEL) tablet 100 mg  100 mg Oral QHS Mansy, Jan A, MD       traZODone (DESYREL) tablet 25 mg  25 mg Oral QHS PRN Mansy, Jan A, MD       vancomycin (VANCOREADY) IVPB 750 mg/150 mL  750 mg Intravenous Q12H Mansy, Jan A, MD       Current Outpatient Medications  Medication Sig Dispense Refill   methadone (DOLOPHINE) 10 MG/ML solution Take by mouth.     prazosin (MINIPRESS) 2 MG capsule Take 2 mg by mouth at bedtime.     QUEtiapine (SEROQUEL) 100 MG tablet Take 100 mg by mouth at bedtime.     citalopram (CELEXA) 20 MG tablet Take 20 mg by mouth at bedtime. (Patient not taking: Reported on 02/06/2021)  Abtx:  Anti-infectives (From admission, onward)    Start     Dose/Rate Route Frequency Ordered Stop   02/07/21 1600  vancomycin (VANCOREADY) IVPB 750 mg/150 mL        750 mg 150 mL/hr over 60 Minutes Intravenous Every 12 hours 02/07/21 0323     02/07/21 1100  ceFEPIme (MAXIPIME) 2 g in sodium chloride 0.9 % 100 mL IVPB        2 g 200 mL/hr over 30 Minutes Intravenous Every 8 hours 02/07/21 0318     02/07/21 0330  vancomycin (VANCOREADY) IVPB 2000 mg/400 mL        2,000 mg 200 mL/hr over 120 Minutes Intravenous  Once 02/07/21 0320 02/07/21 0646   02/07/21 0315  vancomycin (VANCOCIN) IVPB 1000 mg/200 mL premix  Status:  Discontinued        1,000 mg 200 mL/hr over 60 Minutes Intravenous  Once 02/07/21 0312 02/07/21 0319   02/07/21 0315  ceFEPIme (MAXIPIME) 2 g in sodium chloride 0.9 % 100 mL IVPB  Status:  Discontinued        2 g 200 mL/hr over 30 Minutes Intravenous  Once 02/07/21 0312 02/07/21 0318   02/07/21 0145  vancomycin (VANCOCIN) IVPB 1000 mg/200 mL premix  Status:  Discontinued        1,000 mg 200 mL/hr over 60 Minutes Intravenous  Once 02/07/21 0133 02/07/21 0320   02/07/21 0145  ceFEPIme (MAXIPIME) 2 g in  sodium chloride 0.9 % 100 mL IVPB        2 g 200 mL/hr over 30 Minutes Intravenous  Once 02/07/21 0133 02/07/21 0343       REVIEW OF SYSTEMS:  Const:  fever,  chills, negative weight loss Eyes: negative diplopia or visual changes, negative eye pain ENT: negative coryza, negative sore throat Resp: negative cough, hemoptysis, dyspnea Cards: negative for chest pain, palpitations, lower extremity edema GU: negative for frequency, dysuria and hematuria GI: Negative for abdominal pain, diarrhea, bleeding, constipation Skin: negative for rash and pruritus Heme: negative for easy bruising and gum/nose bleeding MS: Generalized body ache Neurolo:negative for headaches, dizziness, vertigo, memory problems  Psych: negative for feelings of anxiety, depression  Endocrine: negative for thyroid, diabetes Allergy/Immunology-PCN allergy test negative o Objective:  VITALS:  BP (!) 116/55   Pulse 64   Temp 98.1 F (36.7 C) (Oral)   Resp 18   Ht 5' 3"  (1.6 m)   Wt 90.7 kg   SpO2 97%   BMI 35.43 kg/m  PHYSICAL EXAM:  General: Alert, cooperative, no distress, appears stated age.  Head: Normocephalic, without obvious abnormality, atraumatic. Eyes: Conjunctivae clear, anicteric sclerae. Pupils are equal ENT Nares normal. No drainage or sinus tenderness. Lips, mucosa, and tongue normal. No Thrush Poor dentition caries present Neck: Supple, symmetrical, no adenopathy, thyroid: non tender no carotid bruit and no JVD. Back: No CVA tenderness. Lungs: Clear to auscultation bilaterally. No Wheezing or Rhonchi. No rales. Heart: Regular rate and rhythm, no murmur, rub or gallop. Abdomen: Soft, non-tender,not distended. Bowel sounds normal. No masses Extremities: Left forearm about the cubital fossa there is an indurated swelling Right arm erythematous indurated swelling           Skin: No rashes or lesions. Or bruising Lymph: Cervical, supraclavicular normal. Neurologic: Grossly  non-focal Pertinent Labs Lab Results CBC    Component Value Date/Time   WBC 7.2 02/07/2021 0427   RBC 3.60 (L) 02/07/2021 0427   HGB 9.1 (L) 02/07/2021 0427   HCT 29.3 (L)  02/07/2021 0427   PLT 344 02/07/2021 0427   MCV 81.4 02/07/2021 0427   MCH 25.3 (L) 02/07/2021 0427   MCHC 31.1 02/07/2021 0427   RDW 15.7 (H) 02/07/2021 0427   LYMPHSABS 2.1 02/06/2021 2145   MONOABS 0.5 02/06/2021 2145   EOSABS 0.1 02/06/2021 2145   BASOSABS 0.0 02/06/2021 2145    CMP Latest Ref Rng & Units 02/07/2021 02/06/2021 02/21/2009  Glucose 70 - 99 mg/dL 112(H) 103(H) 80  BUN 6 - 20 mg/dL 15 21(H) 15  Creatinine 0.44 - 1.00 mg/dL 0.72 0.96 <0.2(L)  Sodium 135 - 145 mmol/L 134(L) 133(L) 138  Potassium 3.5 - 5.1 mmol/L 3.7 4.8 3.8  Chloride 98 - 111 mmol/L 105 103 118(H)  CO2 22 - 32 mmol/L 23 23 -  Calcium 8.9 - 10.3 mg/dL 8.4(L) 8.9 -  Total Protein 6.5 - 8.1 g/dL - 7.9 -  Total Bilirubin 0.3 - 1.2 mg/dL - 0.3 -  Alkaline Phos 38 - 126 U/L - 147(H) -  AST 15 - 41 U/L - 25 -  ALT 0 - 44 U/L - 26 -      Microbiology: Recent Results (from the past 240 hour(s))  Resp Panel by RT-PCR (Flu A&B, Covid) Nasopharyngeal Swab     Status: None   Collection Time: 02/07/21 12:17 AM   Specimen: Nasopharyngeal Swab; Nasopharyngeal(NP) swabs in vial transport medium  Result Value Ref Range Status   SARS Coronavirus 2 by RT PCR NEGATIVE NEGATIVE Final    Comment: (NOTE) SARS-CoV-2 target nucleic acids are NOT DETECTED.  The SARS-CoV-2 RNA is generally detectable in upper respiratory specimens during the acute phase of infection. The lowest concentration of SARS-CoV-2 viral copies this assay can detect is 138 copies/mL. A negative result does not preclude SARS-Cov-2 infection and should not be used as the sole basis for treatment or other patient management decisions. A negative result may occur with  improper specimen collection/handling, submission of specimen other than nasopharyngeal swab, presence  of viral mutation(s) within the areas targeted by this assay, and inadequate number of viral copies(<138 copies/mL). A negative result must be combined with clinical observations, patient history, and epidemiological information. The expected result is Negative.  Fact Sheet for Patients:  EntrepreneurPulse.com.au  Fact Sheet for Healthcare Providers:  IncredibleEmployment.be  This test is no t yet approved or cleared by the [place] FDA and  has been authorized for detection and/or diagnosis of SARS-CoV-2 by FDA under an Emergency Use Authorization (EUA). This EUA will remain  in effect (meaning this test can be used) for the duration of the COVID-19 declaration under Section 564(b)(1) of the Act, 21 U.S.C.section 360bbb-3(b)(1), unless the authorization is terminated  or revoked sooner.       Influenza A by PCR NEGATIVE NEGATIVE Final   Influenza B by PCR NEGATIVE NEGATIVE Final    Comment: (NOTE) The Xpert Xpress SARS-CoV-2/FLU/RSV plus assay is intended as an aid in the diagnosis of influenza from Nasopharyngeal swab specimens and should not be used as a sole basis for treatment. Nasal washings and aspirates are unacceptable for Xpert Xpress SARS-CoV-2/FLU/RSV testing.  Fact Sheet for Patients: EntrepreneurPulse.com.au  Fact Sheet for Healthcare Providers: IncredibleEmployment.be  This test is not yet approved or cleared by the [place] FDA and has been authorized for detection and/or diagnosis of SARS-CoV-2 by FDA under an Emergency Use Authorization (EUA). This EUA will remain in effect (meaning this test can be used) for the duration of the COVID-19 declaration under Section 564(b)(1) of  the Act, 21 U.S.C. section 360bbb-3(b)(1), unless the authorization is terminated or revoked.  Performed at [facility], [address] [place], [place] [address]   Culture, blood (Routine  X 2) w Reflex to ID Panel     Status: None (Preliminary result)   Collection Time: 02/07/21  1:55 AM   Specimen: BLOOD  Result Value Ref Range Status   Specimen Description BLOOD RIGHT ARM  Final   Special Requests   Final    BOTTLES DRAWN AEROBIC AND ANAEROBIC Blood Culture results may not be optimal due to an inadequate volume of blood received in culture bottles   Culture   Final    NO GROWTH < 12 HOURS Performed at [facility], [address] [place], [place] [address]    Report Status PENDING  Incomplete  Culture, blood (Routine X 2) w Reflex to ID Panel     Status: None (Preliminary result)   Collection Time: 02/07/21  1:55 AM   Specimen: BLOOD  Result Value Ref Range Status   Specimen Description BLOOD RIGHT ARM  Final   Special Requests   Final    BOTTLES DRAWN AEROBIC AND ANAEROBIC Blood Culture adequate volume   Culture   Final    NO GROWTH < 12 HOURS Performed at [facility], [address] [place], [place] [address]    Report Status PENDING  Incomplete    IMAGING RESULTS: CT chest Isolated, pathologically enlarged left subpectoral lymph node    No PE or septic emboli I have personally reviewed the films ? Impression/Recommendation ?35 yr female with h/o IVDU presents with multiple painful swelling arms with fever and chills  1 Soft tissue infection/evolving abscesses left arm and rt forearm- need imaging and surgical consult Continue vanco/cefepime  2) h/o recurrent  endocarditis- non adherence in the past 3) h/o recurrent bacteremias MSSA/MRSA/enterococcus Recent enterococcus and candida parapsilosis fungemia in oct 2022 and was treated  4) Poor compliance/adherence to treatment- has left AMA many times  5) PCN allergy test neg on 12/27/20- amoxicillin challenge at [place] was successful- so PCN allergy need to removed  6) H/O chronic ulcers in the legs - now with scabs  ?7) H./o rt index finger infection with amputation 8) Low  IgA-  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 9) Positive ANA in the past ? ___________________________________________________ Discussed with patient, requesting provider Note:  This document was prepared using Dragon voice recognition software and may include unintentional dictation errors.

## 2021-02-07 NOTE — ED Notes (Signed)
Pharmacy contacted about Vancomycin  

## 2021-02-07 NOTE — Progress Notes (Addendum)
Interim note - not for billing  Patient admitted earlier today. Hx polysubstance abuse including ongoing IVDU, history mssa bacteremia and endocarditis requiring multiple rounds of antibiotics, history of leaving AMA and med noncompliance, presenting with several days chills and subjective fever which she says is similar to past episodes of bacteremia, also with worsening skin infections. Here afebrile, hemodynamically stable. Blood cultures pending. No murmur on exam; TTE pending. Does have several sites of cellulitis on extremities. Has been started on broad spectrum abx (vanc/cefepime) which I will continue. At last [facility] hospitalization was treated for endocarditis though blood cultures negative (met minor criteria). Will involve ID. It does appear blood cultures were obtained prior to starting abx.   On review of imaging, left subpectoral node and rul pulmonary nodule noted on cta which was neg for PE w/o signs septic emboli, will need outpt f/u of that.

## 2021-02-07 NOTE — Plan of Care (Signed)

## 2021-02-07 NOTE — Progress Notes (Signed)
Pharmacy Antibiotic Note  Karen Clarke is a 35 y.o. female admitted on 02/07/2021 with cellulitis.  Pharmacy has been consulted for Vanc, Cefepime dosing.  Cefepime 2 gm IV X 1 given in ED on 12/07 @ 0243.  Cefepime 2 gm IV Q8H ordered to continue on 12/07 @ 1100.   Vancomycin 2 gm IV X 1 given on 12/07 @ ~ 0400. Vancomycin 750 mg IV Q12H ordered to start on 12/07 @ 1600.   AUC = 526.9 Vanc trough = 16.1  Plan:   Height: 5\' 3"  (160 cm) Weight: 90.7 kg (200 lb) IBW/kg (Calculated) : 52.4  Temp (24hrs), Avg:98.7 F (37.1 C), Min:98.6 F (37 C), Max:98.7 F (37.1 C)  Recent Labs  Lab 02/06/21 2103 02/06/21 2145 02/07/21 0155  WBC  --  10.9*  --   CREATININE  --  0.96  --   LATICACIDVEN 2.0*  --  1.0    Estimated Creatinine Clearance: 89.1 mL/min (by C-G formula based on SCr of 0.96 mg/dL).    Allergies  Allergen Reactions   Amoxicillin    Capsaicin Hives and Itching    Antimicrobials this admission:   >>    >>   Dose adjustments this admission:   Microbiology results:  BCx:   UCx:    Sputum:    MRSA PCR:   Thank you for allowing pharmacy to be a part of this patient's care.  Karen Clarke D 02/07/2021 3:24 AM

## 2021-02-07 NOTE — Progress Notes (Signed)
PHARMACY - PHYSICIAN COMMUNICATION CRITICAL VALUE ALERT - BLOOD CULTURE IDENTIFICATION (BCID)  Karen Clarke is an 35 y.o. female who presented to [facility] on 02/07/2021 with a chief complaint of fever and chills.  Assessment:  1/4(aerobic) GPC, Enterococcus Faecalis  Name of physician (or Provider) Contacted: Manuela Schwartz  Current antibiotics: Vancomycin, Cefepime  Changes to prescribed antibiotics recommended:  Recommendations accepted by provider. Will continue current abx and await further speciation and sensitivities.  No results found for this or any previous visit. Lois Huxley sure why this hasn't been updated.  Ezequias Lard A Elhadj Girton 02/07/2021  10:01 PM

## 2021-02-07 NOTE — ED Notes (Addendum)
Message sent to pharmacy regarding pt's methadone.  Pt reports she takes her methadone in the morning between 5-6 am.

## 2021-02-08 DIAGNOSIS — R7881 Bacteremia: Secondary | ICD-10-CM | POA: Diagnosis not present

## 2021-02-08 DIAGNOSIS — L0291 Cutaneous abscess, unspecified: Secondary | ICD-10-CM

## 2021-02-08 DIAGNOSIS — B952 Enterococcus as the cause of diseases classified elsewhere: Secondary | ICD-10-CM | POA: Diagnosis not present

## 2021-02-08 DIAGNOSIS — F1990 Other psychoactive substance use, unspecified, uncomplicated: Secondary | ICD-10-CM | POA: Diagnosis not present

## 2021-02-08 LAB — CBC
HCT: 33.6 % — ABNORMAL LOW (ref 36.0–46.0)
Hemoglobin: 10.5 g/dL — ABNORMAL LOW (ref 12.0–15.0)
MCH: 25.4 pg — ABNORMAL LOW (ref 26.0–34.0)
MCHC: 31.3 g/dL (ref 30.0–36.0)
MCV: 81.4 fL (ref 80.0–100.0)
Platelets: 347 10*3/uL (ref 150–400)
RBC: 4.13 MIL/uL (ref 3.87–5.11)
RDW: 15.9 % — ABNORMAL HIGH (ref 11.5–15.5)
WBC: 6.1 10*3/uL (ref 4.0–10.5)
nRBC: 0 % (ref 0.0–0.2)

## 2021-02-08 LAB — BASIC METABOLIC PANEL
Anion gap: 7 (ref 5–15)
BUN: 13 mg/dL (ref 6–20)
CO2: 22 mmol/L (ref 22–32)
Calcium: 8.8 mg/dL — ABNORMAL LOW (ref 8.9–10.3)
Chloride: 107 mmol/L (ref 98–111)
Creatinine, Ser: 0.83 mg/dL (ref 0.44–1.00)
GFR, Estimated: >60 mL/min (ref >60)
Glucose, Bld: 105 mg/dL — ABNORMAL HIGH (ref 70–99)
Potassium: 4.1 mmol/L (ref 3.5–5.1)
Sodium: 136 mmol/L (ref 135–145)

## 2021-02-08 LAB — HIV ANTIBODY (ROUTINE TESTING W REFLEX): HIV Screen 4th Generation wRfx: NONREACTIVE

## 2021-02-08 LAB — PROCALCITONIN: Procalcitonin: <0.1 ng/mL

## 2021-02-08 MED ORDER — SODIUM CHLORIDE 0.9 % IV SOLN
2.0000 g | INTRAVENOUS | Status: DC
  Administered 2021-02-08 – 2021-02-10 (×12): 2 g via INTRAVENOUS
  Filled 2021-02-08: qty 2000
  Filled 2021-02-08 (×2): qty 2
  Filled 2021-02-08 (×5): qty 2000
  Filled 2021-02-08: qty 2
  Filled 2021-02-08 (×2): qty 2000
  Filled 2021-02-08: qty 2
  Filled 2021-02-08 (×2): qty 2000
  Filled 2021-02-08: qty 2
  Filled 2021-02-08 (×4): qty 2000

## 2021-02-08 MED ORDER — SODIUM CHLORIDE 0.9 % IV SOLN
2.0000 g | Freq: Two times a day (BID) | INTRAVENOUS | Status: DC
  Administered 2021-02-08 – 2021-02-10 (×5): 2 g via INTRAVENOUS
  Filled 2021-02-08: qty 2
  Filled 2021-02-08: qty 20
  Filled 2021-02-08: qty 2
  Filled 2021-02-08: qty 20
  Filled 2021-02-08: qty 2
  Filled 2021-02-08 (×2): qty 20

## 2021-02-08 MED ORDER — SODIUM CHLORIDE 0.9 % IV SOLN: INTRAVENOUS | Status: DC | PRN

## 2021-02-08 NOTE — TOC Initial Note (Signed)
Transition of Care [facility]) - Initial/Assessment Note    Patient Details  Name: Karen Clarke MRN: [id number] Date of Birth: 11-09-1985  [facility] CM/SW Contact:    Marlowe Sax, RN Phone Number: 02/08/2021, 10:41 AM  Clinical Narrative:      Patient comes from home,  She is a current user of injected fentanyl and cocaine,  She used to inject heroin and has quit 3 to 4 months ago.  She is currently on methadone followed at [facility] clinic. She has been seen several times at [facility] for endocarditis and was treated but left AMA prior to completion of treatment,  The patient does not have a PCP on file, She does not have insurance, Provided [facility] with [facility] and [facility], [facility] the [facility] thru [facility] to [facility], she will get any medications at  and provided her with Substance abuse resources TOC to continue to follow for additional needs       Patient Goals and CMS Choice        Expected Discharge Plan and Services                                                Prior Living Arrangements/Services                       Activities of Daily Living Home Assistive Devices/Equipment: None ADL Screening (condition at time of admission) Patient's cognitive ability adequate to safely complete daily activities?: Yes Is the patient deaf or have difficulty hearing?: No Does the patient have difficulty seeing, even when wearing glasses/contacts?: No Does the patient have difficulty concentrating, remembering, or making decisions?: No Patient able to express need for assistance with ADLs?: Yes Does the patient have difficulty dressing or bathing?: No Independently performs ADLs?: Yes (appropriate for developmental age) Does the patient have difficulty walking or climbing stairs?: No Weakness of Legs: None Weakness of Arms/Hands: None  Permission Sought/Granted                  Emotional Assessment               Admission diagnosis:  IVDU (intravenous drug user) [F19.90] Cellulitis of right lower extremity [L03.115] Wound of left lower extremity, initial encounter [S81.802A] Cellulitis, unspecified cellulitis site [L03.90] Patient Active Problem List   Diagnosis Date Noted   Cellulitis of right lower extremity 02/07/2021   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   Polysubstance abuse (HCC) 06/21/2018   Pseudotumor cerebri 06/21/2018   Tobacco abuse 06/21/2018   IVDU (intravenous drug user) 06/16/2018   Positive hepatitis C antibody test 03/27/2016   PCP:  Patient, No Pcp Per (Inactive) Pharmacy:  No Pharmacies Listed    Social Determinants of Health (SDOH) Interventions    Readmission Risk Interventions No flowsheet data found.

## 2021-02-08 NOTE — Progress Notes (Signed)
Date of Admission:  02/07/2021    ID: Karen Clarke is a 35 y.o. female Principal Problem:   Bacteremia Active Problems:   Cellulitis of right lower extremity   Hx of bacterial endocarditis   IVDU (intravenous drug user)   Tobacco abuse  Boy friend at bed side- he is not aware of her IVDA- so did not talk about it in front of him  Subjective: Pt stable Pain left arm better  Medications:   enoxaparin (LOVENOX) injection  0.5 mg/kg Subcutaneous Q24H   influenza vac split quadrivalent PF  0.5 mL Intramuscular Tomorrow-1000   methadone  100 mg Oral q AM   prazosin  2 mg Oral QHS   QUEtiapine  100 mg Oral QHS    Objective: Vital signs in last 24 hours: Temp:  [97.8 F (36.6 C)-99 F (37.2 C)] 99 F (37.2 C) (12/08 1117) Pulse Rate:  [67-96] 77 (12/08 1117) Resp:  [16-20] 16 (12/08 1117) BP: (90-128)/(53-74) 102/54 (12/08 1117) SpO2:  [96 %-100 %] 97 % (12/08 1117)  PHYSICAL EXAM:  General: Alert, cooperative, no distress, appears stated age.  Head: Normocephalic, without obvious abnormality, atraumatic. Eyes: Conjunctivae clear, anicteric sclerae. Pupils are equal ENT Nares normal. No drainage or sinus tenderness. Lips, mucosa, and tongue normal. No Thrush Neck: Supple, symmetrical, no adenopathy, thyroid: non tender no carotid bruit and no JVD. Back: No CVA tenderness. Lungs: Clear to auscultation bilaterally. No Wheezing or Rhonchi. No rales. Heart: Regular rate and rhythm, no murmur, rub or gallop. Abdomen: Soft, non-tender,not distended. Bowel sounds normal. No masses Extremities: scabbed wounds shins Swelling left arm- improving Rt forearm erythematous swelling/ is fluctuant now Skin: multiple scars and scabs Lymph: Cervical, supraclavicular normal. Neurologic: Grossly non-focal  Lab Results Recent Labs    02/07/21 0427 02/08/21 1247  WBC 7.2 6.1  HGB 9.1* 10.5*  HCT 29.3* 33.6*  NA 134* 136  K 3.7 4.1  CL 105 107  CO2 23 22  BUN 15 13  CREATININE  0.72 0.83   Liver Panel Recent Labs    02/06/21 2145  PROT 7.9  ALBUMIN 3.9  AST 25  ALT 26  ALKPHOS 147*  BILITOT 0.3   Sedimentation Rate No results for input(s): ESRSEDRATE in the last 72 hours. C-Reactive Protein No results for input(s): CRP in the last 72 hours.  Microbiology: 02/07/21 BC 1 set has enterococcus Faecalis Studies/Results: DG Chest 2 View  Result Date: 02/06/2021 CLINICAL DATA:  Cough and fever EXAM: CHEST - 2 VIEW COMPARISON:  09/06/2007 FINDINGS: The heart size and mediastinal contours are within normal limits. Both lungs are clear. The visualized skeletal structures are unremarkable. IMPRESSION: No active cardiopulmonary disease. Electronically Signed   By: Jasmine Pang M.D.   On: 02/06/2021 21:22   CT Angio Chest PE W and/or Wo Contrast  Result Date: 02/07/2021 CLINICAL DATA:  PE suspected, high prob. 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 Multiplanar CT image reconstructions and MIPs were obtained to evaluate the vascular anatomy. CONTRAST:  25mL OMNIPAQUE IOHEXOL 350 MG/ML SOLN COMPARISON:  None FINDINGS: Cardiovascular: Satisfactory opacification of the pulmonary arteries to the segmental level. No evidence of pulmonary embolism. Normal heart size. No pericardial effusion. Mediastinum/Nodes: A single pathologically enlarged left subpectoral lymph node is identified measuring 15 x 22 mm at axial image # 7/4. No other  pathologic thoracic adenopathy. Thyroid unremarkable. Esophagus is unremarkable. Lungs/Pleura: 5 mm subpleural indeterminate pulmonary nodule noted within the a subpleural right upper lobe at axial image # 41/6. The lungs are otherwise clear. No pneumothorax or pleural effusion. Upper Abdomen: No acute abnormality.  Musculoskeletal: No chest wall abnormality. No acute or significant osseous findings. Review of the MIP images confirms the above findings. IMPRESSION: No pulmonary embolism. Isolated, pathologically enlarged left subpectoral lymph node. Given the patient's history, this may be reactive in nature. However, correlation with left breast examination and possible mammography may be helpful for further evaluation. 5 mm indeterminate right upper lobe pulmonary nodule. No follow-up needed if patient is low-risk. Non-contrast chest CT can be considered in 12 months if patient is high-risk. This recommendation follows the consensus statement: Guidelines for Management of Incidental Pulmonary Nodules Detected on CT Images: From the Fleischner Society 2017; Radiology 2017; 284:228-243. Electronically Signed   By: Fidela Salisbury M.D.   On: 02/07/2021 02:32   ECHOCARDIOGRAM COMPLETE  Result Date: 02/07/2021    ECHOCARDIOGRAM REPORT   Patient Name:   Karen Clarke Date of Exam: 02/07/2021 Medical Rec #:  [id number]  Height:       63.0 in Accession #:    [id number] Weight:       200.0 lb Date of Birth:  November 28, 1985  BSA:          1.934 m Patient Age:    35 years   BP:           116/55 mmHg Patient Gender: F          HR:           67 bpm. Exam Location:  [facility] Procedure: 2D Echo, Color Doppler and Cardiac Doppler Indications:     R06.00 Dyspnea  History:         Patient has no prior history of Echocardiogram examinations.                  Lupus; Risk Factors:Current Smoker. Hx of polysubstance abuse.  Sonographer:     Charmayne Sheer Referring Phys:  [id number] JAN A MANSY Diagnosing Phys: Kate Sable MD  Sonographer Comments: Suboptimal apical window and suboptimal subcostal window. IMPRESSIONS  1. Left ventricular ejection fraction, by estimation, is 60 to 65%. The left ventricle has normal function. The left ventricle has no regional wall motion abnormalities. Left ventricular diastolic parameters were normal.  2. Right ventricular  systolic function is normal. The right ventricular size is normal.  3. The mitral valve is normal in structure. No evidence of mitral valve regurgitation.  4. The aortic valve is tricuspid. Aortic valve regurgitation is not visualized. FINDINGS  Left Ventricle: Left ventricular ejection fraction, by estimation, is 60 to 65%. The left ventricle has normal function. The left ventricle has no regional wall motion abnormalities. The left ventricular internal cavity size was normal in size. There is  no left ventricular hypertrophy. Left ventricular diastolic parameters were normal. Right Ventricle: The right ventricular size is normal. No increase in right ventricular wall thickness. Right ventricular systolic function is normal. Left Atrium: Left atrial size was normal in size. Right Atrium: Right atrial size was normal in size. Pericardium: There is no evidence of pericardial effusion. Mitral Valve: The mitral valve is normal in structure. No evidence of mitral valve regurgitation. MV peak gradient, 3.8 mmHg. The mean mitral valve gradient is 1.0 mmHg. Tricuspid Valve: The tricuspid valve is normal in structure. Tricuspid valve regurgitation is not demonstrated. Aortic Valve:  The aortic valve is tricuspid. Aortic valve regurgitation is not visualized. Aortic valve mean gradient measures 3.0 mmHg. Aortic valve peak gradient measures 4.7 mmHg. Aortic valve area, by VTI measures 2.70 cm. Pulmonic Valve: The pulmonic valve was normal in structure. Pulmonic valve regurgitation is not visualized. Aorta: The aortic root is normal in size and structure. Venous: The inferior vena cava was not well visualized. IAS/Shunts: No atrial level shunt detected by color flow Doppler.  LEFT VENTRICLE PLAX 2D LVIDd:         5.30 cm   Diastology LVIDs:         3.55 cm   LV e' medial:    13.90 cm/s LV PW:         0.83 cm   LV E/e' medial:  6.4 LV IVS:        0.63 cm   LV e' lateral:   14.90 cm/s LVOT diam:     1.90 cm   LV E/e' lateral:  6.0 LV SV:         58 LV SV Index:   30 LVOT Area:     2.84 cm  RIGHT VENTRICLE RV Basal diam:  2.88 cm LEFT ATRIUM             Index        RIGHT ATRIUM           Index LA diam:        3.10 cm 1.60 cm/m   RA Area:     15.30 cm LA Vol (A2C):   39.0 ml 20.17 ml/m  RA Volume:   41.30 ml  21.36 ml/m LA Vol (A4C):   40.3 ml 20.84 ml/m LA Biplane Vol: 40.5 ml 20.94 ml/m  AORTIC VALVE                    PULMONIC VALVE AV Area (Vmax):    2.78 cm     PV Vmax:       0.93 m/s AV Area (Vmean):   2.69 cm     PV Vmean:      63.600 cm/s AV Area (VTI):     2.70 cm     PV VTI:        0.207 m AV Vmax:           108.00 cm/s  PV Peak grad:  3.5 mmHg AV Vmean:          75.400 cm/s  PV Mean grad:  2.0 mmHg AV VTI:            0.215 m AV Peak Grad:      4.7 mmHg AV Mean Grad:      3.0 mmHg LVOT Vmax:         106.00 cm/s LVOT Vmean:        71.500 cm/s LVOT VTI:          0.205 m LVOT/AV VTI ratio: 0.95  AORTA Ao Root diam: 2.90 cm MITRAL VALVE MV Area (PHT): 2.85 cm    SHUNTS MV Area VTI:   1.99 cm    Systemic VTI:  0.20 m MV Peak grad:  3.8 mmHg    Systemic Diam: 1.90 cm MV Mean grad:  1.0 mmHg MV Vmax:       0.98 m/s MV Vmean:      49.8 cm/s MV Decel Time: 266 msec MV E velocity: 89.10 cm/s MV A velocity: 39.00 cm/s MV E/A ratio:  2.28 Kate Sable MD Electronically signed  by Kate Sable MD Signature Date/Time: 02/07/2021/4:46:45 PM    Final      Assessment/Plan: 35 yr female with h/o IVDU presents with multiple painful swelling arms with fever and chills   1 Soft tissue infection/evolving abscesses left arm and rt forearm- need imaging and I/D_ seen by surgery  Enterococcus bacteremia from culture 02/07/21 She had this in oct 2002 at [facility] and got 6 days of Iv antibiotic Need to r/o endocarditis - needs TEE Will give amp+ ceftriaxone Will need 4-6 weeks of antibiotic Cannot be sent home with PICC While inpatient try Midline instead of PICC     h/o recurrent  endocarditis- non adherence in the  past  h/o recurrent bacteremias MSSA/MRSA/enterococcus H/o candidemia in oct 2022- received 3 weeks of antifungal in oct 2022   Poor compliance/adherence to treatment- has left AMA many times    PCN allergy test neg on 12/27/20- amoxicillin challenge at [place] was successful-    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   Positive ANA in the past    Discussed the management with the patient. Her partner was in the room and he is not aware of IVDU

## 2021-02-08 NOTE — Progress Notes (Signed)
PROGRESS NOTE    Shelva Ream  [id number] DOB: Oct 18, 1985 DOA: 02/07/2021 PCP: Patient, No Pcp Per (Inactive)  Outpatient Specialists: none    Brief Narrative:  From admission h and p Ismerai Brockschmidt is a 35 y.o. 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  She admits to night sweats and with her fever and chills.  She has been having occasional nausea and vomiting.  She denies any abdominal pain.  She describes a sharp pleuritic chest pain which has been intermittent.  She admits to injecting cocaine and fentanyl.  She used to inject heroin and has quit 3 to 4 months ago.  She is currently on methadone followed at [facility] clinic.  No dysuria, oliguria or hematuria or flank pain.  No bleeding diathesis.   Assessment & Plan:   Principal Problem:   Bacteremia Active Problems:   Cellulitis of right lower extremity   Hx of bacterial endocarditis   IVDU (intravenous drug user)   Tobacco abuse  # Bacteremia Here with subjective fever, chills, and malaise, consistent w/ prior episodes of bacteremia, in the setting of ongoing IVDU. Anaerobic and aerobic bottles growing e. Faecalis, sensitivities pending. Recent MSSA bacteremia - vanc/cefepime have been narrowed by ID to amp/ceftriaxone. Sensitivities pending.  # Cellulitis/Abscess Multiple sites of cellulitis/abscess on extremities. Appear to be responding to IV abx. - have asked gen surg to eval, see if I and D needed  # History right-sided endocarditis TTE neg. - cardiology consult for TEE  # History right finger flexor  tenosynovitis S/p amputation. - outpt hand surgery f/u  # Chronic non-healing leg ulcers Previously seen by derm, possible pyogenic granuloma which is a dx of exclusion - will need outpt derm f/u  # Opioid use disorder Hep c antibody positive, rna neg recent hospitalization. - cont home methadone - f/u HIV  # MDD, PTSD - cont home seroquel, citalopram, prazosin  # Left subpectoral node and right upper lung nodule - outpt repeat imaging  DVT prophylaxis: lovenox Code Status: full Family Communication: none @ bedside  Level of care: Med-Surg Status is: Inpatient  Remains inpatient appropriate because: severity of illness, ongoing w/u, need for IV abx        Consultants:  ID, gen surg, cardiology  Procedures: none  Antimicrobials:  S/p vanc/cefepime Ampicillin/ceftriaxone >    Subjective: Fatigued, no pain, tolerating diet.  Objective: Vitals:   02/07/21 2153 02/08/21 0413 02/08/21 0803 02/08/21 1117  BP: 120/62 (!) 122/57 (!) 90/53 (!) 102/54  Pulse: 73 67 89 77  Resp: 20 20 16 16   Temp: 98.5 F (36.9 C) 98.5 F (36.9 C) 98.5 F (36.9 C) 99 F (37.2 C)  TempSrc: Oral  Oral Oral  SpO2: 100% 96% 97% 97%  Weight:      Height:        Intake/Output Summary (Last 24 hours) at 02/08/2021 1135 Last data filed at 02/08/2021 0309 Gross per 24 hour  Intake 361.27 ml  Output --  Net 361.27 ml   Filed Weights   02/06/21 2056  Weight: 90.7 kg    Examination:  General exam: Appears calm and comfortable  Respiratory system: Clear  to auscultation. Respiratory effort normal. Cardiovascular system: S1 & S2 heard, RRR. No JVD, murmurs, rubs, gallops or clicks. No pedal edema. Gastrointestinal system: Abdomen is nondistended, soft and nontender. No organomegaly or masses felt. Normal bowel sounds heard. Central nervous system: Alert and oriented. No focal neurological deficits. Extremities: Symmetric 5 x 5 power. Skin: on extremities there are ulcers lower  extremities and scattered areas of erythema/swelling consistent w/ cellulitis/abscess Psychiatry: Judgement and insight appear normal. Mood & affect appropriate.     Data Reviewed: I have personally reviewed following labs and imaging studies  CBC: Recent Labs  Lab 02/06/21 2145 02/07/21 0427  WBC 10.9* 7.2  NEUTROABS 8.2*  --   HGB 10.3* 9.1*  HCT 33.6* 29.3*  MCV 82.0 81.4  PLT 401* [id number]   Basic Metabolic Panel: Recent Labs  Lab 02/06/21 2145 02/07/21 0427  NA 133* 134*  K 4.8 3.7  CL 103 105  CO2 23 23  GLUCOSE 103* 112*  BUN 21* 15  CREATININE 0.96 0.72  CALCIUM 8.9 8.4*   GFR: Estimated Creatinine Clearance: 106.9 mL/min (by C-G formula based on SCr of 0.72 mg/dL). Liver Function Tests: Recent Labs  Lab 02/06/21 2145  AST 25  ALT 26  ALKPHOS 147*  BILITOT 0.3  PROT 7.9  ALBUMIN 3.9   No results for input(s): LIPASE, AMYLASE in the last 168 hours. No results for input(s): AMMONIA in the last 168 hours. Coagulation Profile: No results for input(s): INR, PROTIME in the last 168 hours. Cardiac Enzymes: No results for input(s): CKTOTAL, CKMB, CKMBINDEX, TROPONINI in the last 168 hours. BNP (last 3 results) No results for input(s): PROBNP in the last 8760 hours. HbA1C: No results for input(s): HGBA1C in the last 72 hours. CBG: No results for input(s): GLUCAP in the last 168 hours. Lipid Profile: No results for input(s): CHOL, HDL, LDLCALC, TRIG, CHOLHDL, LDLDIRECT in the last 72 hours. Thyroid Function Tests: No results for input(s): TSH, T4TOTAL, FREET4, T3FREE, THYROIDAB in the last 72 hours. Anemia Panel: No results for input(s): VITAMINB12, FOLATE, FERRITIN, TIBC, IRON, RETICCTPCT in the last 72 hours. Urine analysis:    Component Value Date/Time   COLORURINE YELLOW 02/07/2021 0017   APPEARANCEUR HAZY (A) 02/07/2021 0017   LABSPEC 1.025 02/07/2021 0017   PHURINE 5.5 02/07/2021 0017   GLUCOSEU NEGATIVE 02/07/2021 0017   HGBUR NEGATIVE  02/07/2021 0017   BILIRUBINUR NEGATIVE 02/07/2021 0017   KETONESUR NEGATIVE 02/07/2021 0017   PROTEINUR NEGATIVE 02/07/2021 0017   UROBILINOGEN 0.2 02/21/2009 1848   NITRITE NEGATIVE 02/07/2021 0017   LEUKOCYTESUR SMALL (A) 02/07/2021 0017   Sepsis Labs: @LABRCNTIP (procalcitonin:4,lacticidven:4)  ) Recent Results (from the past 240 hour(s))  Resp Panel by RT-PCR (Flu A&B, Covid) Nasopharyngeal Swab     Status: None   Collection Time: 02/07/21 12:17 AM   Specimen: Nasopharyngeal Swab; Nasopharyngeal(NP) swabs in vial transport medium  Result Value Ref Range Status   SARS Coronavirus 2 by RT PCR NEGATIVE NEGATIVE Final    Comment: (NOTE) SARS-CoV-2 target nucleic acids are NOT DETECTED.  The SARS-CoV-2 RNA is generally detectable in upper respiratory specimens during the acute phase of infection. The lowest concentration of SARS-CoV-2 viral copies this assay can detect is 138 copies/mL. A negative result does not preclude SARS-Cov-2 infection and should not be used as the sole basis for treatment or other patient management decisions. A negative result may occur with  improper specimen collection/handling, submission of specimen other than nasopharyngeal swab, presence of viral mutation(s) within the areas targeted  by this assay, and inadequate number of viral copies(<138 copies/mL). A negative result must be combined with clinical observations, patient history, and epidemiological information. The expected result is Negative.  Fact Sheet for Patients:  EntrepreneurPulse.com.au  Fact Sheet for Healthcare Providers:  IncredibleEmployment.be  This test is no t yet approved or cleared by the [place] FDA and  has been authorized for detection and/or diagnosis of SARS-CoV-2 by FDA under an Emergency Use Authorization (EUA). This EUA will remain  in effect (meaning this test can be used) for the duration of the COVID-19 declaration under  Section 564(b)(1) of the Act, 21 U.S.C.section 360bbb-3(b)(1), unless the authorization is terminated  or revoked sooner.       Influenza A by PCR NEGATIVE NEGATIVE Final   Influenza B by PCR NEGATIVE NEGATIVE Final    Comment: (NOTE) The Xpert Xpress SARS-CoV-2/FLU/RSV plus assay is intended as an aid in the diagnosis of influenza from Nasopharyngeal swab specimens and should not be used as a sole basis for treatment. Nasal washings and aspirates are unacceptable for Xpert Xpress SARS-CoV-2/FLU/RSV testing.  Fact Sheet for Patients: EntrepreneurPulse.com.au  Fact Sheet for Healthcare Providers: IncredibleEmployment.be  This test is not yet approved or cleared by the [place] FDA and has been authorized for detection and/or diagnosis of SARS-CoV-2 by FDA under an Emergency Use Authorization (EUA). This EUA will remain in effect (meaning this test can be used) for the duration of the COVID-19 declaration under Section 564(b)(1) of the Act, 21 U.S.C. section 360bbb-3(b)(1), unless the authorization is terminated or revoked.  Performed at [facility], [address] [place], [place] [address]   Culture, blood (Routine X 2) w Reflex to ID Panel     Status: None (Preliminary result)   Collection Time: 02/07/21  1:55 AM   Specimen: BLOOD  Result Value Ref Range Status   Specimen Description   Final    BLOOD RIGHT ARM Performed at [facility], [address] [place], [place] [address]    Special Requests   Final    BOTTLES DRAWN AEROBIC AND ANAEROBIC Blood Culture results may not be optimal due to an inadequate volume of blood received in culture bottles Performed at [facility], [address] [place], [place] [address]    Culture  Setup Time   Final    GRAM POSITIVE COCCI IN BOTH AEROBIC AND ANAEROBIC BOTTLES Organism ID to follow CRITICAL RESULT CALLED TO, READ BACK BY AND VERIFIED WITH: walid  [email or web address]  02/07/21 rh    Culture GRAM POSITIVE COCCI  Final   Report Status PENDING  Incomplete  Culture, blood (Routine X 2) w Reflex to ID Panel     Status: None (Preliminary result)   Collection Time: 02/07/21  1:55 AM   Specimen: BLOOD  Result Value Ref Range Status   Specimen Description BLOOD RIGHT ARM  Final   Special Requests   Final    BOTTLES DRAWN AEROBIC AND ANAEROBIC Blood Culture adequate volume   Culture   Final    NO GROWTH 1 DAY Performed at [facility], [address], [place], [place] [address]    Report Status PENDING  Incomplete  Blood Culture ID Panel (Reflexed)     Status: Abnormal   Collection Time: 02/07/21  1:55 AM  Result Value Ref Range Status   Enterococcus faecalis DETECTED (A) NOT DETECTED Final    Comment: CRITICAL RESULT CALLED TO, READ BACK BY AND VERIFIED WITH: [email or web address] [email or web address]  02/07/21 RH  Enterococcus Faecium NOT DETECTED NOT DETECTED Final   Listeria monocytogenes NOT DETECTED NOT DETECTED Final   Staphylococcus species NOT DETECTED NOT DETECTED Final   Staphylococcus aureus (BCID) NOT DETECTED NOT DETECTED Final   Staphylococcus epidermidis NOT DETECTED NOT DETECTED Final   Staphylococcus lugdunensis NOT DETECTED NOT DETECTED Final   Streptococcus species NOT DETECTED NOT DETECTED Final   Streptococcus agalactiae NOT DETECTED NOT DETECTED Final   Streptococcus pneumoniae NOT DETECTED NOT DETECTED Final   Streptococcus pyogenes NOT DETECTED NOT DETECTED Final   A.calcoaceticus-baumannii NOT DETECTED NOT DETECTED Final   Bacteroides fragilis NOT DETECTED NOT DETECTED Final   Enterobacterales NOT DETECTED NOT DETECTED Final   Enterobacter cloacae complex NOT DETECTED NOT DETECTED Final   Escherichia coli NOT DETECTED NOT DETECTED Final   Klebsiella aerogenes NOT DETECTED NOT DETECTED Final   Klebsiella oxytoca NOT DETECTED NOT DETECTED Final   Klebsiella pneumoniae NOT DETECTED NOT DETECTED Final   Proteus species NOT DETECTED  NOT DETECTED Final   Salmonella species NOT DETECTED NOT DETECTED Final   Serratia marcescens NOT DETECTED NOT DETECTED Final   Haemophilus influenzae NOT DETECTED NOT DETECTED Final   Neisseria meningitidis NOT DETECTED NOT DETECTED Final   Pseudomonas aeruginosa NOT DETECTED NOT DETECTED Final   Stenotrophomonas maltophilia NOT DETECTED NOT DETECTED Final   Candida albicans NOT DETECTED NOT DETECTED Final   Candida auris NOT DETECTED NOT DETECTED Final   Candida glabrata NOT DETECTED NOT DETECTED Final   Candida krusei NOT DETECTED NOT DETECTED Final   Candida parapsilosis NOT DETECTED NOT DETECTED Final   Candida tropicalis NOT DETECTED NOT DETECTED Final   Cryptococcus neoformans/gattii NOT DETECTED NOT DETECTED Final   Vancomycin resistance NOT DETECTED NOT DETECTED Final    Comment: Performed at [facility], [address] [place], [place] [address]         Radiology Studies: DG Chest 2 View  Result Date: 02/06/2021 CLINICAL DATA:  Cough and fever EXAM: CHEST - 2 VIEW COMPARISON:  09/06/2007 FINDINGS: The heart size and mediastinal contours are within normal limits. Both lungs are clear. The visualized skeletal structures are unremarkable. IMPRESSION: No active cardiopulmonary disease. Electronically Signed   By: Donavan Foil M.D.   On: 02/06/2021 21:22   CT Angio Chest PE W and/or Wo Contrast  Result Date: 02/07/2021 CLINICAL DATA:  PE suspected, high prob. 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 Multiplanar CT image reconstructions and MIPs were obtained to evaluate the vascular anatomy. CONTRAST:  81mL OMNIPAQUE IOHEXOL 350 MG/ML SOLN COMPARISON:  None FINDINGS: Cardiovascular: Satisfactory opacification of the pulmonary arteries to the  segmental level. No evidence of pulmonary embolism. Normal heart size. No pericardial effusion. Mediastinum/Nodes: A single pathologically enlarged left subpectoral lymph node is identified measuring 15 x 22 mm at axial image # 7/4. No other pathologic thoracic adenopathy. Thyroid unremarkable. Esophagus is unremarkable. Lungs/Pleura: 5 mm subpleural indeterminate pulmonary nodule noted within the a subpleural right upper lobe at axial image # 41/6. The lungs are otherwise clear. No pneumothorax or pleural effusion. Upper Abdomen: No acute abnormality. Musculoskeletal: No chest wall abnormality. No acute or significant osseous findings. Review of the MIP images confirms the above findings. IMPRESSION: No pulmonary embolism. Isolated, pathologically enlarged left subpectoral lymph node. Given the patient's history, this may be reactive in nature. However, correlation with  left breast examination and possible mammography may be helpful for further evaluation. 5 mm indeterminate right upper lobe pulmonary nodule. No follow-up needed if patient is low-risk. Non-contrast chest CT can be considered in 12 months if patient is high-risk. This recommendation follows the consensus statement: Guidelines for Management of Incidental Pulmonary Nodules Detected on CT Images: From the Fleischner Society 2017; Radiology 2017; 284:228-243. Electronically Signed   By: Fidela Salisbury M.D.   On: 02/07/2021 02:32   ECHOCARDIOGRAM COMPLETE  Result Date: 02/07/2021    ECHOCARDIOGRAM REPORT   Patient Name:   VEARL HEDTKE Date of Exam: 02/07/2021 Medical Rec #:  [id number]  Height:       63.0 in Accession #:    [id number] Weight:       200.0 lb Date of Birth:  07/12/1985  BSA:          1.934 m Patient Age:    79 years   BP:           116/55 mmHg Patient Gender: F          HR:           67 bpm. Exam Location:  [facility] Procedure: 2D Echo, Color Doppler and Cardiac Doppler Indications:     R06.00 Dyspnea  History:         Patient has no prior  history of Echocardiogram examinations.                  Lupus; Risk Factors:Current Smoker. Hx of polysubstance abuse.  Sonographer:     Charmayne Sheer Referring Phys:  [id number] JAN A MANSY Diagnosing Phys: Kate Sable MD  Sonographer Comments: Suboptimal apical window and suboptimal subcostal window. IMPRESSIONS  1. Left ventricular ejection fraction, by estimation, is 60 to 65%. The left ventricle has normal function. The left ventricle has no regional wall motion abnormalities. Left ventricular diastolic parameters were normal.  2. Right ventricular systolic function is normal. The right ventricular size is normal.  3. The mitral valve is normal in structure. No evidence of mitral valve regurgitation.  4. The aortic valve is tricuspid. Aortic valve regurgitation is not visualized. FINDINGS  Left Ventricle: Left ventricular ejection fraction, by estimation, is 60 to 65%. The left ventricle has normal function. The left ventricle has no regional wall motion abnormalities. The left ventricular internal cavity size was normal in size. There is  no left ventricular hypertrophy. Left ventricular diastolic parameters were normal. Right Ventricle: The right ventricular size is normal. No increase in right ventricular wall thickness. Right ventricular systolic function is normal. Left Atrium: Left atrial size was normal in size. Right Atrium: Right atrial size was normal in size. Pericardium: There is no evidence of pericardial effusion. Mitral Valve: The mitral valve is normal in structure. No evidence of mitral valve regurgitation. MV peak gradient, 3.8 mmHg. The mean mitral valve gradient is 1.0 mmHg. Tricuspid Valve: The tricuspid valve is normal in structure. Tricuspid valve regurgitation is not demonstrated. Aortic Valve: The aortic valve is tricuspid. Aortic valve regurgitation is not visualized. Aortic valve mean gradient measures 3.0 mmHg. Aortic valve peak gradient measures 4.7 mmHg. Aortic valve area, by VTI  measures 2.70 cm. Pulmonic Valve: The pulmonic valve was normal in structure. Pulmonic valve regurgitation is not visualized. Aorta: The aortic root is normal in size and structure. Venous: The inferior vena cava was not well visualized. IAS/Shunts: No atrial level shunt detected by color flow Doppler.  LEFT VENTRICLE PLAX 2D LVIDd:  5.30 cm   Diastology LVIDs:         3.55 cm   LV e' medial:    13.90 cm/s LV PW:         0.83 cm   LV E/e' medial:  6.4 LV IVS:        0.63 cm   LV e' lateral:   14.90 cm/s LVOT diam:     1.90 cm   LV E/e' lateral: 6.0 LV SV:         58 LV SV Index:   30 LVOT Area:     2.84 cm  RIGHT VENTRICLE RV Basal diam:  2.88 cm LEFT ATRIUM             Index        RIGHT ATRIUM           Index LA diam:        3.10 cm 1.60 cm/m   RA Area:     15.30 cm LA Vol (A2C):   39.0 ml 20.17 ml/m  RA Volume:   41.30 ml  21.36 ml/m LA Vol (A4C):   40.3 ml 20.84 ml/m LA Biplane Vol: 40.5 ml 20.94 ml/m  AORTIC VALVE                    PULMONIC VALVE AV Area (Vmax):    2.78 cm     PV Vmax:       0.93 m/s AV Area (Vmean):   2.69 cm     PV Vmean:      63.600 cm/s AV Area (VTI):     2.70 cm     PV VTI:        0.207 m AV Vmax:           108.00 cm/s  PV Peak grad:  3.5 mmHg AV Vmean:          75.400 cm/s  PV Mean grad:  2.0 mmHg AV VTI:            0.215 m AV Peak Grad:      4.7 mmHg AV Mean Grad:      3.0 mmHg LVOT Vmax:         106.00 cm/s LVOT Vmean:        71.500 cm/s LVOT VTI:          0.205 m LVOT/AV VTI ratio: 0.95  AORTA Ao Root diam: 2.90 cm MITRAL VALVE MV Area (PHT): 2.85 cm    SHUNTS MV Area VTI:   1.99 cm    Systemic VTI:  0.20 m MV Peak grad:  3.8 mmHg    Systemic Diam: 1.90 cm MV Mean grad:  1.0 mmHg MV Vmax:       0.98 m/s MV Vmean:      49.8 cm/s MV Decel Time: 266 msec MV E velocity: 89.10 cm/s MV A velocity: 39.00 cm/s MV E/A ratio:  2.28 Debbe Odea MD Electronically signed by Debbe Odea MD Signature Date/Time: 02/07/2021/4:46:45 PM    Final         Scheduled  Meds:  enoxaparin (LOVENOX) injection  0.5 mg/kg Subcutaneous Q24H   influenza vac split quadrivalent PF  0.5 mL Intramuscular Tomorrow-1000   methadone  100 mg Oral q AM   prazosin  2 mg Oral QHS   QUEtiapine  100 mg Oral QHS   Continuous Infusions:  sodium chloride Stopped (02/08/21 0303)   ampicillin (OMNIPEN) IV     cefTRIAXone (ROCEPHIN)  IV  LOS: 1 day    Time spent: [place], MD [facility]   If 7PM-7AM, please contact night-coverage [email or web address] Password [facility] 02/08/2021, 11:35 AM

## 2021-02-08 NOTE — Consult Note (Signed)
SURGICAL CONSULTATION NOTE   HISTORY OF PRESENT ILLNESS (HPI):  35 y.o. female presented to [facility] ED for evaluation of fever and chills. Patient reports she has been having progressive for the last few days.  She has been also feeling weaker.  She accepts denies using IV drugs.  At the ED she was found with multiple upper and lower extremity ulcers and cellulitis.  She was admitted for IV antibiotic therapy.  Patient has a history of bacteremia and endocarditis.  This admission she was found to be positive for bacteremia.  Surgery is consulted by Dr. Ashok Pall in this context for evaluation and management of upper extremity abscess.  PAST MEDICAL HISTORY (PMH):  Past Medical History:  Diagnosis Date   IgA deficiency (HCC)    Lupus (HCC)      PAST SURGICAL HISTORY (PSH):  History reviewed. No pertinent surgical history.   MEDICATIONS:  Prior to Admission medications   Medication Sig Start Date End Date Taking? Authorizing Provider  methadone (DOLOPHINE) 10 MG/ML solution Take by mouth.   Yes [provider]  prazosin (MINIPRESS) 2 MG capsule Take 2 mg by mouth at bedtime. 12/13/20  Yes [provider]  QUEtiapine (SEROQUEL) 100 MG tablet Take 100 mg by mouth at bedtime. 12/30/20  Yes [provider]  citalopram (CELEXA) 20 MG tablet Take 20 mg by mouth at bedtime. Patient not taking: Reported on 02/06/2021 10/03/20   [provider]     ALLERGIES:  Allergies  Allergen Reactions   Capsaicin Hives and Itching     SOCIAL HISTORY:  Social History   Socioeconomic History   Marital status: Single    Spouse name: Not on file   Number of children: Not on file   Years of education: Not on file   Highest education level: Not on file  Occupational History   Not on file  Tobacco Use   Smoking status: Every Day    Types: Cigarettes   Smokeless tobacco: Never  Substance and Sexual Activity   Alcohol use: Never   Drug use: Never   Sexual activity: Not  on file  Other Topics Concern   Not on file  Social History Narrative   Not on file   Social Determinants of Health   Financial Resource Strain: Not on file  Food Insecurity: Not on file  Transportation Needs: Not on file  Physical Activity: Not on file  Stress: Not on file  Social Connections: Not on file  Intimate Partner Violence: Not on file      FAMILY HISTORY:  History reviewed. No pertinent family history.   REVIEW OF SYSTEMS:  Constitutional: Positive fever, chills, or sweats  Eyes: denies any other vision changes, history of eye injury  ENT: denies sore throat, hearing problems  Respiratory: denies shortness of breath, wheezing  Cardiovascular: denies chest pain, palpitations  Gastrointestinal: Negative abdominal pain, nausea and vomiting Genitourinary: denies burning with urination or urinary frequency Musculoskeletal: denies any other joint pains or cramps  Skin: denies any other rashes or skin discolorations  Neurological: denies any other headache, dizziness, weakness  Psychiatric: denies any other depression, anxiety   All other review of systems were negative   VITAL SIGNS:  Temp:  [97.8 F (36.6 C)-99 F (37.2 C)] 99 F (37.2 C) (12/08 1117) Pulse Rate:  [67-96] 77 (12/08 1117) Resp:  [16-20] 16 (12/08 1117) BP: (90-128)/(53-74) 102/54 (12/08 1117) SpO2:  [96 %-100 %] 97 % (12/08 1117)     Height: 5\' 3"  (160  cm) Weight: 90.7 kg BMI (Calculated): 35.44   INTAKE/OUTPUT:  This shift: No intake/output data recorded.  Last 2 shifts: @IOLAST2SHIFTS @   PHYSICAL EXAM:  Constitutional:  -- Normal body habitus  -- Awake, alert, and oriented x3  Eyes:  -- Pupils equally round and reactive to light  -- No scleral icterus  Ear, nose, and throat:  -- No jugular venous distension  Pulmonary:  -- No crackles  -- Equal breath sounds bilaterally -- Breathing non-labored at rest Cardiovascular:  -- S1, S2 present  -- No pericardial rubs Gastrointestinal:   -- Abdomen soft, nontender, non-distended, no guarding or rebound tenderness -- No abdominal masses appreciated, pulsatile or otherwise  Musculoskeletal and Integumentary:  -- Multiple ulcers and cellulitis in upper and lower extremities.   -Left leg ulcer dry without fluctuant collection -Right forearm small area of small fluid collection with local cellulitis. Neurologic:  -- Motor function: intact and symmetric -- Sensation: intact and symmetric   Labs:  CBC Latest Ref Rng & Units 02/08/2021 02/07/2021 02/06/2021  WBC 4.0 - 10.5 K/uL 6.1 7.2 10.9(H)  Hemoglobin 12.0 - 15.0 g/dL 10.5(L) 9.1(L) 10.3(L)  Hematocrit 36.0 - 46.0 % 33.6(L) 29.3(L) 33.6(L)  Platelets 150 - 400 K/uL 347 344 401(H)   CMP Latest Ref Rng & Units 02/08/2021 02/07/2021 02/06/2021  Glucose 70 - 99 mg/dL 105(H) 112(H) 103(H)  BUN 6 - 20 mg/dL 13 15 21(H)  Creatinine 0.44 - 1.00 mg/dL 0.83 0.72 0.96  Sodium 135 - 145 mmol/L 136 134(L) 133(L)  Potassium 3.5 - 5.1 mmol/L 4.1 3.7 4.8  Chloride 98 - 111 mmol/L 107 105 103  CO2 22 - 32 mmol/L 22 23 23   Calcium 8.9 - 10.3 mg/dL 8.8(L) 8.4(L) 8.9  Total Protein 6.5 - 8.1 g/dL - - 7.9  Total Bilirubin 0.3 - 1.2 mg/dL - - 0.3  Alkaline Phos 38 - 126 U/L - - 147(H)  AST 15 - 41 U/L - - 25  ALT 0 - 44 U/L - - 26   Blood culture shows Enterococcus faecalis bacteremia  Imaging studies:  I personally evaluated the CT of the chest.  No finding of active infection.  Assessment/Plan:  35 y.o. female with bacteremia, complicated by pertinent comorbidities including IV drug abuse.  Patient with multiple open lower extremity ulcers and areas of erythema.  These are coming from areas of IV drug injections.  The only area that might be amenable for drainage is the right forearm small fluid collection.  This is very small and might respond to antibiotic therapy.  I will keep an eye closely.  If it does not improve in the next 24 to 48 hours, I will consider incision and drainage.   If patient respond to antibiotic therapy I will try to avoid any invasive intervention.  Patient might need to do a drug test to be able to receive anesthesia.  Continue medical management as per primary team and infectious disease.  I will continue to follow closely.   Arnold Long, MD

## 2021-02-09 ENCOUNTER — Other Ambulatory Visit: Payer: Self-pay | Admitting: Cardiovascular Disease

## 2021-02-09 ENCOUNTER — Inpatient Hospital Stay (HOSPITAL_COMMUNITY)
Admit: 2021-02-09 | Discharge: 2021-02-09 | Disposition: A | Discharge status: Home / Self Care | Payer: BLUE CROSS/BLUE SHIELD | Attending: Cardiovascular Disease | Admitting: Cardiovascular Disease

## 2021-02-09 ENCOUNTER — Encounter
Admission: EM | Discharge status: Against Medical Advice | Payer: Self-pay | Source: Home / Self Care | Attending: Obstetrics and Gynecology

## 2021-02-09 ENCOUNTER — Inpatient Hospital Stay: Payer: BLUE CROSS/BLUE SHIELD | Admitting: Anesthesiology

## 2021-02-09 DIAGNOSIS — L0390 Cellulitis, unspecified: Secondary | ICD-10-CM | POA: Diagnosis not present

## 2021-02-09 DIAGNOSIS — R7881 Bacteremia: Secondary | ICD-10-CM | POA: Diagnosis not present

## 2021-02-09 DIAGNOSIS — S81802A Unspecified open wound, left lower leg, initial encounter: Secondary | ICD-10-CM | POA: Diagnosis not present

## 2021-02-09 DIAGNOSIS — Z8679 Personal history of other diseases of the circulatory system: Secondary | ICD-10-CM

## 2021-02-09 DIAGNOSIS — F1990 Other psychoactive substance use, unspecified, uncomplicated: Secondary | ICD-10-CM | POA: Diagnosis not present

## 2021-02-09 HISTORY — PX: TEE WITHOUT CARDIOVERSION: SHX5443

## 2021-02-09 LAB — CBC
HCT: 33.2 % — ABNORMAL LOW (ref 36.0–46.0)
Hemoglobin: 10.3 g/dL — ABNORMAL LOW (ref 12.0–15.0)
MCH: 25.2 pg — ABNORMAL LOW (ref 26.0–34.0)
MCHC: 31 g/dL (ref 30.0–36.0)
MCV: 81.4 fL (ref 80.0–100.0)
Platelets: 354 10*3/uL (ref 150–400)
RBC: 4.08 MIL/uL (ref 3.87–5.11)
RDW: 15.9 % — ABNORMAL HIGH (ref 11.5–15.5)
WBC: 5.7 10*3/uL (ref 4.0–10.5)
nRBC: 0 % (ref 0.0–0.2)

## 2021-02-09 LAB — BASIC METABOLIC PANEL
Anion gap: 8 (ref 5–15)
BUN: 11 mg/dL (ref 6–20)
CO2: 25 mmol/L (ref 22–32)
Calcium: 8.6 mg/dL — ABNORMAL LOW (ref 8.9–10.3)
Chloride: 105 mmol/L (ref 98–111)
Creatinine, Ser: 0.79 mg/dL (ref 0.44–1.00)
GFR, Estimated: >60 mL/min (ref >60)
Glucose, Bld: 95 mg/dL (ref 70–99)
Potassium: 4.1 mmol/L (ref 3.5–5.1)
Sodium: 138 mmol/L (ref 135–145)

## 2021-02-09 LAB — PROCALCITONIN: Procalcitonin: <0.1 ng/mL

## 2021-02-09 SURGERY — ECHOCARDIOGRAM, TRANSESOPHAGEAL
Anesthesia: General

## 2021-02-09 MED ORDER — BUTAMBEN-TETRACAINE-BENZOCAINE 2-2-14 % EX AERO
INHALATION_SPRAY | CUTANEOUS | Status: AC
  Filled 2021-02-09: qty 5

## 2021-02-09 MED ORDER — PROPOFOL 10 MG/ML IV BOLUS
INTRAVENOUS | Status: AC
  Filled 2021-02-09: qty 20

## 2021-02-09 MED ORDER — MIDAZOLAM HCL 2 MG/2ML IJ SOLN
INTRAMUSCULAR | Status: AC
  Filled 2021-02-09: qty 2

## 2021-02-09 MED ORDER — DEXMEDETOMIDINE HCL IN NACL 200 MCG/50ML IV SOLN
INTRAVENOUS | Status: AC
  Filled 2021-02-09: qty 50

## 2021-02-09 MED ORDER — ENOXAPARIN SODIUM 40 MG/0.4ML IJ SOSY
40.0000 mg | PREFILLED_SYRINGE | INTRAMUSCULAR | Status: DC
Start: 2021-02-09 — End: 2021-02-10

## 2021-02-09 MED ORDER — PROPOFOL 10 MG/ML IV BOLUS
INTRAVENOUS | Status: DC | PRN
  Administered 2021-02-09: 30 mg via INTRAVENOUS
  Administered 2021-02-09: 40 mg via INTRAVENOUS
  Administered 2021-02-09: 30 mg via INTRAVENOUS

## 2021-02-09 MED ORDER — EPHEDRINE SULFATE-NACL 50-0.9 MG/10ML-% IV SOSY
PREFILLED_SYRINGE | INTRAVENOUS | Status: DC | PRN
  Administered 2021-02-09: 15 mg via INTRAVENOUS
  Administered 2021-02-09: 10 mg via INTRAVENOUS

## 2021-02-09 MED ORDER — DEXMEDETOMIDINE (PRECEDEX) IN NS 20 MCG/5ML (4 MCG/ML) IV SYRINGE
PREFILLED_SYRINGE | INTRAVENOUS | Status: DC | PRN
  Administered 2021-02-09: 12 ug via INTRAVENOUS

## 2021-02-09 MED ORDER — LIDOCAINE VISCOUS HCL 2 % MT SOLN
OROMUCOSAL | Status: AC
  Filled 2021-02-09: qty 15

## 2021-02-09 MED ORDER — SODIUM CHLORIDE 0.9 % IV SOLN: INTRAVENOUS | Status: DC

## 2021-02-09 MED ORDER — MIDAZOLAM HCL 2 MG/2ML IJ SOLN
INTRAMUSCULAR | Status: DC | PRN
  Administered 2021-02-09: 2 mg via INTRAVENOUS

## 2021-02-09 NOTE — Progress Notes (Signed)
PROGRESS NOTE    Karen Clarke  [id number] DOB: 01-Feb-1986 DOA: 02/07/2021 PCP: Patient, No Pcp Per (Inactive)  Outpatient Specialists: none    Brief Narrative:  From admission h and p Karen Clarke is a 35 y.o. 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  She admits to night sweats and with her fever and chills.  She has been having occasional nausea and vomiting.  She denies any abdominal pain.  She describes a sharp pleuritic chest pain which has been intermittent.  She admits to injecting cocaine and fentanyl.  She used to inject heroin and has quit 3 to 4 months ago.  She is currently on methadone followed at [facility] clinic.  No dysuria, oliguria or hematuria or flank pain.  No bleeding diathesis.   Assessment & Plan:   Principal Problem:   Bacteremia Active Problems:   Cellulitis of right lower extremity   Hx of bacterial endocarditis   IVDU (intravenous drug user)   Tobacco abuse  # Bacteremia Here with subjective fever, chills, and malaise, consistent w/ prior episodes of bacteremia, in the setting of ongoing IVDU. Anaerobic and aerobic bottles growing e. Faecalis, sensitivities pending. Recent MSSA bacteremia - vanc/cefepime have been narrowed by ID to amp/ceftriaxone. Sensitivities pending. - per ID given ongoing IVDU will need to stay inpatient for duration of abx likely 6 wks  # Cellulitis/Abscess Multiple sites of cellulitis/abscess on extremities. Appear to be responding to IV abx. Gen surg following, advising iv abx for now  # History right-sided endocarditis TTE and  TEE both negative  # History right finger flexor tenosynovitis S/p amputation. - outpt hand surgery f/u  # Chronic non-healing leg ulcers Previously seen by derm, possible pyogenic granuloma which is a dx of exclusion - will need outpt derm f/u  # Opioid use disorder Hep c antibody positive, rna neg recent hospitalization. Hiv neg - cont home methadone  # MDD, PTSD - cont home seroquel, citalopram, prazosin  # Left subpectoral node and right upper lung nodule - outpt repeat imaging  DVT prophylaxis: lovenox Code Status: full Family Communication: none @ bedside. Declines offer to call and update family  Level of care: Med-Surg Status is: Inpatient  Remains inpatient appropriate because: need for IV abx        Consultants:  ID, gen surg, cardiology  Procedures: none  Antimicrobials:  S/p vanc/cefepime Ampicillin/ceftriaxone >    Subjective: Fatigued, no pain, tolerating diet.  Objective: Vitals:   02/09/21 0900 02/09/21 0924 02/09/21 0930 02/09/21 1013  BP: (!) 86/50 (!) 100/56 (!) 95/54 (!) 98/54  Pulse: 72 80 68 63  Resp: 11 12 (!) 8 14  Temp:    97.6 F (36.4 C)  TempSrc:      SpO2: 95% 93% 94% 97%  Weight:      Height:        Intake/Output Summary (Last 24 hours) at 02/09/2021 1207 Last data filed at 02/09/2021 0851 Gross per 24 hour  Intake 150 ml  Output 0 ml  Net 150 ml   Filed Weights   02/06/21 2056  Weight: 90.7 kg    Examination:  General exam: Appears calm  and comfortable  Respiratory system: Clear to auscultation. Respiratory effort normal. Cardiovascular system: S1 & S2 heard, RRR. No JVD, murmurs, rubs, gallops or clicks. No pedal edema. Gastrointestinal system: Abdomen is nondistended, soft and nontender. No organomegaly or masses felt. Normal bowel sounds heard. Central nervous system: Alert and oriented. No focal neurological deficits. Extremities: Symmetric 5 x 5 power. Skin: on extremities there are ulcers lower  extremities and scattered areas of erythema/swelling consistent w/ cellulitis/abscess, improving Psychiatry: Judgement and insight appear normal. Mood & affect appropriate.     Data Reviewed: I have personally reviewed following labs and imaging studies  CBC: Recent Labs  Lab 02/06/21 2145 02/07/21 0427 02/08/21 1247 02/09/21 0747  WBC 10.9* 7.2 6.1 5.7  NEUTROABS 8.2*  --   --   --   HGB 10.3* 9.1* 10.5* 10.3*  HCT 33.6* 29.3* 33.6* 33.2*  MCV 82.0 81.4 81.4 81.4  PLT 401* 344 347 [id number]   Basic Metabolic Panel: Recent Labs  Lab 02/06/21 2145 02/07/21 0427 02/08/21 1247 02/09/21 0747  NA 133* 134* 136 138  K 4.8 3.7 4.1 4.1  CL 103 105 107 105  CO2 23 23 22 25   GLUCOSE 103* 112* 105* 95  BUN 21* 15 13 11   CREATININE 0.96 0.72 0.83 0.79  CALCIUM 8.9 8.4* 8.8* 8.6*   GFR: Estimated Creatinine Clearance: 106.9 mL/min (by C-G formula based on SCr of 0.79 mg/dL). Liver Function Tests: Recent Labs  Lab 02/06/21 2145  AST 25  ALT 26  ALKPHOS 147*  BILITOT 0.3  PROT 7.9  ALBUMIN 3.9   No results for input(s): LIPASE, AMYLASE in the last 168 hours. No results for input(s): AMMONIA in the last 168 hours. Coagulation Profile: No results for input(s): INR, PROTIME in the last 168 hours. Cardiac Enzymes: No results for input(s): CKTOTAL, CKMB, CKMBINDEX, TROPONINI in the last 168 hours. BNP (last 3 results) No results for input(s): PROBNP in the last 8760 hours. HbA1C: No results for input(s): HGBA1C in the last 72 hours. CBG: No results for input(s): GLUCAP in the last 168 hours. Lipid Profile: No results for input(s): CHOL, HDL, LDLCALC, TRIG, CHOLHDL, LDLDIRECT in the last 72 hours. Thyroid Function Tests: No results for input(s): TSH, T4TOTAL, FREET4, T3FREE, THYROIDAB in the last 72 hours. Anemia Panel: No results for input(s): VITAMINB12, FOLATE, FERRITIN, TIBC, IRON, RETICCTPCT in the last 72 hours. Urine analysis:    Component Value Date/Time    COLORURINE YELLOW 02/07/2021 0017   APPEARANCEUR HAZY (A) 02/07/2021 0017   LABSPEC 1.025 02/07/2021 0017   PHURINE 5.5 02/07/2021 0017   GLUCOSEU NEGATIVE 02/07/2021 0017   HGBUR NEGATIVE 02/07/2021 0017   BILIRUBINUR NEGATIVE 02/07/2021 0017   KETONESUR NEGATIVE 02/07/2021 0017   PROTEINUR NEGATIVE 02/07/2021 0017   UROBILINOGEN 0.2 02/21/2009 1848   NITRITE NEGATIVE 02/07/2021 0017   LEUKOCYTESUR SMALL (A) 02/07/2021 0017   Sepsis Labs: @LABRCNTIP (procalcitonin:4,lacticidven:4)  ) Recent Results (from the past 240 hour(s))  Resp Panel by RT-PCR (Flu A&B, Covid) Nasopharyngeal Swab     Status: None   Collection Time: 02/07/21 12:17 AM   Specimen: Nasopharyngeal Swab; Nasopharyngeal(NP) swabs in vial transport medium  Result Value Ref Range Status   SARS Coronavirus 2 by RT PCR NEGATIVE NEGATIVE Final    Comment: (NOTE) SARS-CoV-2 target nucleic acids are NOT DETECTED.  The SARS-CoV-2 RNA is generally detectable in upper respiratory specimens during the acute phase of infection. The lowest concentration of SARS-CoV-2 viral copies this assay can detect is 138 copies/mL. A negative  result does not preclude SARS-Cov-2 infection and should not be used as the sole basis for treatment or other patient management decisions. A negative result may occur with  improper specimen collection/handling, submission of specimen other than nasopharyngeal swab, presence of viral mutation(s) within the areas targeted by this assay, and inadequate number of viral copies(<138 copies/mL). A negative result must be combined with clinical observations, patient history, and epidemiological information. The expected result is Negative.  Fact Sheet for Patients:  BloggerCourse.com  Fact Sheet for Healthcare Providers:  SeriousBroker.it  This test is no t yet approved or cleared by the [place] FDA and  has been authorized for detection and/or  diagnosis of SARS-CoV-2 by FDA under an Emergency Use Authorization (EUA). This EUA will remain  in effect (meaning this test can be used) for the duration of the COVID-19 declaration under Section 564(b)(1) of the Act, 21 U.S.C.section 360bbb-3(b)(1), unless the authorization is terminated  or revoked sooner.       Influenza A by PCR NEGATIVE NEGATIVE Final   Influenza B by PCR NEGATIVE NEGATIVE Final    Comment: (NOTE) The Xpert Xpress SARS-CoV-2/FLU/RSV plus assay is intended as an aid in the diagnosis of influenza from Nasopharyngeal swab specimens and should not be used as a sole basis for treatment. Nasal washings and aspirates are unacceptable for Xpert Xpress SARS-CoV-2/FLU/RSV testing.  Fact Sheet for Patients: BloggerCourse.com  Fact Sheet for Healthcare Providers: SeriousBroker.it  This test is not yet approved or cleared by the [place] FDA and has been authorized for detection and/or diagnosis of SARS-CoV-2 by FDA under an Emergency Use Authorization (EUA). This EUA will remain in effect (meaning this test can be used) for the duration of the COVID-19 declaration under Section 564(b)(1) of the Act, 21 U.S.C. section 360bbb-3(b)(1), unless the authorization is terminated or revoked.  Performed at [facility], [address] [place], [place] [address]   Culture, blood (Routine X 2) w Reflex to ID Panel     Status: Abnormal (Preliminary result)   Collection Time: 02/07/21  1:55 AM   Specimen: BLOOD  Result Value Ref Range Status   Specimen Description   Final    BLOOD RIGHT ARM Performed at [facility], [address], [place], [place] [address]    Special Requests   Final    BOTTLES DRAWN AEROBIC AND ANAEROBIC Blood Culture results may not be optimal due to an inadequate volume of blood received in culture bottles Performed at [facility], [address] [place],  [place] [address]    Culture  Setup Time   Final    GRAM POSITIVE COCCI IN BOTH AEROBIC AND ANAEROBIC BOTTLES Organism ID to follow CRITICAL RESULT CALLED TO, READ BACK BY AND VERIFIED WITH: [email or web address]  02/07/21 rh    Culture ENTEROCOCCUS FAECALIS (A)  Final   Report Status PENDING  Incomplete  Culture, blood (Routine X 2) w Reflex to ID Panel     Status: None (Preliminary result)   Collection Time: 02/07/21  1:55 AM   Specimen: BLOOD  Result Value Ref Range Status   Specimen Description BLOOD RIGHT ARM  Final   Special Requests   Final    BOTTLES DRAWN AEROBIC AND ANAEROBIC Blood Culture adequate volume   Culture   Final    NO GROWTH 2 DAYS Performed at [facility], [address], [place], [place] [address]    Report Status PENDING  Incomplete  Blood Culture ID Panel (Reflexed)  Status: Abnormal   Collection Time: 02/07/21  1:55 AM  Result Value Ref Range Status   Enterococcus faecalis DETECTED (A) NOT DETECTED Final    Comment: CRITICAL RESULT CALLED TO, READ BACK BY AND VERIFIED WITH: [email or web address]  02/07/21 RH    Enterococcus Faecium NOT DETECTED NOT DETECTED Final   Listeria monocytogenes NOT DETECTED NOT DETECTED Final   Staphylococcus species NOT DETECTED NOT DETECTED Final   Staphylococcus aureus (BCID) NOT DETECTED NOT DETECTED Final   Staphylococcus epidermidis NOT DETECTED NOT DETECTED Final   Staphylococcus lugdunensis NOT DETECTED NOT DETECTED Final   Streptococcus species NOT DETECTED NOT DETECTED Final   Streptococcus agalactiae NOT DETECTED NOT DETECTED Final   Streptococcus pneumoniae NOT DETECTED NOT DETECTED Final   Streptococcus pyogenes NOT DETECTED NOT DETECTED Final   A.calcoaceticus-baumannii NOT DETECTED NOT DETECTED Final   Bacteroides fragilis NOT DETECTED NOT DETECTED Final   Enterobacterales NOT DETECTED NOT DETECTED Final   Enterobacter cloacae complex NOT DETECTED NOT DETECTED Final   Escherichia coli NOT DETECTED NOT DETECTED  Final   Klebsiella aerogenes NOT DETECTED NOT DETECTED Final   Klebsiella oxytoca NOT DETECTED NOT DETECTED Final   Klebsiella pneumoniae NOT DETECTED NOT DETECTED Final   Proteus species NOT DETECTED NOT DETECTED Final   Salmonella species NOT DETECTED NOT DETECTED Final   Serratia marcescens NOT DETECTED NOT DETECTED Final   Haemophilus influenzae NOT DETECTED NOT DETECTED Final   Neisseria meningitidis NOT DETECTED NOT DETECTED Final   Pseudomonas aeruginosa NOT DETECTED NOT DETECTED Final   Stenotrophomonas maltophilia NOT DETECTED NOT DETECTED Final   Candida albicans NOT DETECTED NOT DETECTED Final   Candida auris NOT DETECTED NOT DETECTED Final   Candida glabrata NOT DETECTED NOT DETECTED Final   Candida krusei NOT DETECTED NOT DETECTED Final   Candida parapsilosis NOT DETECTED NOT DETECTED Final   Candida tropicalis NOT DETECTED NOT DETECTED Final   Cryptococcus neoformans/gattii NOT DETECTED NOT DETECTED Final   Vancomycin resistance NOT DETECTED NOT DETECTED Final    Comment: Performed at [facility], [address], [place], [place] [address]         Radiology Studies: No results found.      Scheduled Meds:  butamben-tetracaine-benzocaine       enoxaparin (LOVENOX) injection  0.5 mg/kg Subcutaneous Q24H   influenza vac split quadrivalent PF  0.5 mL Intramuscular Tomorrow-1000   lidocaine       methadone  100 mg Oral q AM   prazosin  2 mg Oral QHS   QUEtiapine  100 mg Oral QHS   Continuous Infusions:  sodium chloride Stopped (02/08/21 0303)   ampicillin (OMNIPEN) IV 2 g (02/09/21 1004)   cefTRIAXone (ROCEPHIN)  IV 2 g (02/09/21 1058)     LOS: 2 days    Time spent: 25 min    Desma Maxim, MD [facility]   If 7PM-7AM, please contact night-coverage [email or web address] Password [email or web address] 02/09/2021, 12:07 PM

## 2021-02-09 NOTE — Progress Notes (Signed)
Patient ID: Karen Clarke, female   DOB: [birth year], 35 y.o.   MRN: [id number]     SURGICAL PROGRESS NOTE   Hospital Day(s): 2.   Interval History: Patient seen and examined, no acute events or new complaints overnight. Patient reports feeling better.  She endorses that the abscesses getting smaller.  She denies any pain.  She denies any fever or chills.  Highest temp 98.7.  White blood cell count 5.7.  Vital signs in last 24 hours: [min-max] current  Temp:  [97.6 F (36.4 C)-98.7 F (37.1 C)] 97.9 F (36.6 C) (12/09 1508) Pulse Rate:  [57-81] 74 (12/09 1508) Resp:  [8-21] 14 (12/09 1508) BP: (72-119)/(34-69) 94/50 (12/09 1508) SpO2:  [91 %-100 %] 99 % (12/09 1508)     Height: 5\' 3"  (160 cm) Weight: 90.7 kg BMI (Calculated): 35.44   Physical Exam:  Constitutional: alert, cooperative and no distress  Respiratory: breathing non-labored at rest  Cardiovascular: regular rate and sinus rhythm  Gastrointestinal: soft, non-tender, and non-distended Extremity: Right forearm small abscess with very small area of fluctuance.  Erythema is very contained.  Nontender to palpation.  Labs:  CBC Latest Ref Rng & Units 02/09/2021 02/08/2021 02/07/2021  WBC 4.0 - 10.5 K/uL 5.7 6.1 7.2  Hemoglobin 12.0 - 15.0 g/dL 10.3(L) 10.5(L) 9.1(L)  Hematocrit 36.0 - 46.0 % 33.2(L) 33.6(L) 29.3(L)  Platelets 150 - 400 K/uL 354 347 344   CMP Latest Ref Rng & Units 02/09/2021 02/08/2021 02/07/2021  Glucose 70 - 99 mg/dL 95 14/09/2020) 244(W)  BUN 6 - 20 mg/dL 11 13 15   Creatinine 0.44 - 1.00 mg/dL 102(V 2.53  Sodium 135 - 145 mmol/L 138 136 134(L)  Potassium 3.5 - 5.1 mmol/L 4.1 4.1 3.7  Chloride 98 - 111 mmol/L 105 107 105  CO2 22 - 32 mmol/L 25 22 23   Calcium 8.9 - 10.3 mg/dL 6.64) 4.03) )  Total Protein 6.5 - 8.1 g/dL - - -  Total Bilirubin 0.3 - 1.2 mg/dL - - -  Alkaline Phos 38 - 126 U/L - - -  AST 15 - 41 U/L - - -  ALT 0 - 44 U/L - - -    Imaging studies: No new pertinent imaging  studies   Assessment/Plan:  35 y.o. female with bacteremia, complicated by pertinent comorbidities including IV drug abuse.  Right forearm abscess today more contained with a very small area of fluctuance.  In my opinion this is slowly resolving with IV antibiotic therapy.  There is some chance to resolve with medical therapy versus draining spontaneously.  Patient would like to avoid an I&D.  I will reevaluate in 24 hours.  If he continues to improve I will leave it alone.  If it is again worse I discussed with patient that she will need incision and drainage.  Continue medical management as per primary team.  I will continue to follow.  2.5(Z, MD

## 2021-02-09 NOTE — Transfer of Care (Signed)
Immediate Anesthesia Transfer of Care Note  Patient: Karen Clarke  Procedure(s) Performed: TRANSESOPHAGEAL ECHOCARDIOGRAM (TEE)  Patient Location: PACU  Anesthesia Type:General  Level of Consciousness: awake and drowsy  Airway & Oxygen Therapy: Patient Spontanous Breathing and Patient connected to nasal cannula oxygen  Post-op Assessment: Report given to RN and Post -op Vital signs reviewed and stable  Post vital signs: Reviewed and stable  Last Vitals:  Vitals Value Taken Time  BP 91/54 02/09/21 0854  Temp    Pulse 76 02/09/21 0854  Resp 11 02/09/21 0854  SpO2 95 % 02/09/21 0854  Vitals shown include unvalidated device data.  Last Pain:  Vitals:   02/09/21 0804  TempSrc:   PainSc: 0-No pain         Complications: No notable events documented.

## 2021-02-09 NOTE — Progress Notes (Signed)
Date of Admission:  02/07/2021    ID: Karen Clarke is a 35 y.o. female Principal Problem:   Bacteremia Active Problems:   Cellulitis of right lower extremity   Hx of bacterial endocarditis   IVDU (intravenous drug user)   Tobacco abuse  Pt back from TEE Feeling okay'says she wants to sleep    Medications:   butamben-tetracaine-benzocaine       enoxaparin (LOVENOX) injection  40 mg Subcutaneous Q24H   influenza vac split quadrivalent PF  0.5 mL Intramuscular Tomorrow-1000   lidocaine       methadone  100 mg Oral q AM   prazosin  2 mg Oral QHS   QUEtiapine  100 mg Oral QHS    Objective: Vital signs in last 24 hours: Temp:  [97.6 F (36.4 C)-98.7 F (37.1 C)] 97.6 F (36.4 C) (12/09 1013) Pulse Rate:  [57-81] 63 (12/09 1013) Resp:  [8-21] 14 (12/09 1013) BP: (72-119)/(34-69) 98/54 (12/09 1013) SpO2:  [91 %-100 %] 97 % (12/09 1013)  PHYSICAL EXAM:  General: Alert, cooperative, no distress, appears stated age.  Head: Normocephalic, without obvious abnormality, atraumatic. Eyes: Conjunctivae clear, anicteric sclerae. Pupils are equal ENT Nares normal. No drainage or sinus tenderness. Lips, mucosa, and tongue normal. No Thrush Neck: Supple, symmetrical, no adenopathy, thyroid: non tender no carotid bruit and no JVD. Back: No CVA tenderness. Lungs: Clear to auscultation bilaterally. No Wheezing or Rhonchi. No rales. Heart: Regular rate and rhythm, no murmur, rub or gallop. Abdomen: Soft, non-tender,not distended. Bowel sounds normal. No masses Extremities: scabbed wounds shins Swelling left arm- improving Rt forearm erythematous swelling/ is fluctuant now Skin: multiple scars and scabs Lymph: Cervical, supraclavicular normal. Neurologic: Grossly non-focal  Lab Results Recent Labs    02/08/21 1247 02/09/21 0747  WBC 6.1 5.7  HGB 10.5* 10.3*  HCT 33.6* 33.2*  NA 136 138  K 4.1 4.1  CL 107 105  CO2 22 25  BUN 13 11  CREATININE 0.83 0.79   Liver Panel Recent  Labs    02/06/21 2145  PROT 7.9  ALBUMIN 3.9  AST 25  ALT 26  ALKPHOS 147*  BILITOT 0.3   Sedimentation Rate No results for input(s): ESRSEDRATE in the last 72 hours. C-Reactive Protein No results for input(s): CRP in the last 72 hours.  Microbiology: 02/07/21 BC 1 set has enterococcus Faecalis Studies/Results: No results found.   Assessment/Plan: 35 yr female with h/o IVDU presents with multiple painful swelling arms with fever and chills   1 Soft tissue infection/evolving abscesses left arm and rt forearm- the rt forearm abscess needs I/D as it is fluctuant  Enterococcus bacteremia from culture 02/07/21 She had this in oct 2022 at [place] and got 6 days of Iv antibiotic  TEE no endocarditis On amp+ ceftriaxone Will need 4-6 weeks of antibiotic Cannot be sent home with PICC      h/o recurrent  endocarditis- non adherence in the past  h/o recurrent bacteremias MSSA/MRSA/enterococcus H/o candidemia in oct 2022- received 3 weeks of antifungal in oct 2022   Poor compliance/adherence to treatment- has left AMA many times    PCN allergy test neg on 12/27/20- amoxicillin challenge at [place] was successful-    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   Positive ANA in the past    Discussed the management with the patient and hospitalist.  ID will  follow her peripherally this weekend Call if needed

## 2021-02-09 NOTE — Anesthesia Postprocedure Evaluation (Signed)
Anesthesia Post Note  Patient: Karen Clarke  Procedure(s) Performed: TRANSESOPHAGEAL ECHOCARDIOGRAM (TEE)  Patient location during evaluation: PACU Anesthesia Type: General Level of consciousness: awake and alert Pain management: pain level controlled Vital Signs Assessment: post-procedure vital signs reviewed and stable Respiratory status: spontaneous breathing, nonlabored ventilation and respiratory function stable Cardiovascular status: blood pressure returned to baseline and stable Postop Assessment: no apparent nausea or vomiting Anesthetic complications: no   No notable events documented.   Last Vitals:  Vitals:   02/09/21 0853 02/09/21 0854  BP: (!) 91/54 (!) 91/54  Pulse: 80 76  Resp: 11 11  Temp:    SpO2: 94%     Last Pain:  Vitals:   02/09/21 0804  TempSrc:   PainSc: 0-No pain                 Foye Deer

## 2021-02-09 NOTE — Progress Notes (Signed)
*  PRELIMINARY RESULTS* Echocardiogram Echocardiogram Transesophageal has been performed.  Karen Clarke 02/09/2021, 8:59 AM

## 2021-02-09 NOTE — TOC Progression Note (Signed)
Transition of Care [facility]) - Progression Note    Patient Details  Name: Karen Clarke MRN: [id number] Date of Birth: 1985/12/13  Transition of Care [facility]) CM/SW Contact  Marlowe Sax, RN Phone Number: 02/09/2021, 2:41 PM  Clinical Narrative:    The patient is likely to be in the hospital to complete the 6 week course of IV ABX due to IV Drug use at home        Expected Discharge Plan and Services                                                 Social Determinants of Health (SDOH) Interventions    Readmission Risk Interventions No flowsheet data found.

## 2021-02-09 NOTE — Anesthesia Procedure Notes (Signed)
Date/Time: 02/09/2021 8:29 AM Performed by: Ginger Carne, CRNA Pre-anesthesia Checklist: Patient identified, Emergency Drugs available, Suction available, Patient being monitored and Timeout performed Patient Re-evaluated:Patient Re-evaluated prior to induction Oxygen Delivery Method: Nasal cannula Preoxygenation: Pre-oxygenation with 100% oxygen Induction Type: IV induction

## 2021-02-09 NOTE — Anesthesia Preprocedure Evaluation (Addendum)
Anesthesia Evaluation  Patient identified by MRN, date of birth, ID band Patient awake    Reviewed: Allergy & Precautions, H&P , NPO status , Patient's Chart, lab work & pertinent test results  Airway Mallampati: II       Dental no notable dental hx.    Pulmonary Current Smoker and Patient abstained from smoking.,    Pulmonary exam normal        Cardiovascular negative cardio ROS   Rhythm:Regular Rate:Normal     Neuro/Psych H/o pseudotumor cerebri negative psych ROS   GI/Hepatic negative GI ROS, (+)     substance abuse (Used 4 days ago. Non-toxic today. No withdrawal symptoms)  cocaine use,   Endo/Other  negative endocrine ROS  Renal/GU negative Renal ROS  negative genitourinary   Musculoskeletal negative musculoskeletal ROS (+)   Abdominal (+) + obese,   Peds negative pediatric ROS (+)  Hematology negative hematology ROS (+)   Anesthesia Other Findings Pt with recurrent bacteremia and h/o bacterial endocarditis and IV drug abuse. Denies recent IV drug use.She is here for evaluation of endocarditis. She has multiple upper and lower extremity ulcers and cellulitis. She is on antibiotics.   CTA and TTE negative for septic emboli or vegetations. Cardiac function normal.   H/o lupus, IgA deficiency  Reproductive/Obstetrics negative OB ROS                            Anesthesia Physical Anesthesia Plan  ASA: 3  Anesthesia Plan: General   Post-op Pain Management:    Induction: Intravenous  PONV Risk Score and Plan: Propofol infusion and Treatment may vary due to age or medical condition  Airway Management Planned: Natural Airway and Nasal Cannula  Additional Equipment:   Intra-op Plan:   Post-operative Plan:   Informed Consent: I have reviewed the patients History and Physical, chart, labs and discussed the procedure including the risks, benefits and alternatives for the  proposed anesthesia with the patient or authorized representative who has indicated his/her understanding and acceptance.     Dental advisory given  Plan Discussed with: CRNA and Anesthesiologist  Anesthesia Plan Comments:         Anesthesia Quick Evaluation

## 2021-02-09 NOTE — Progress Notes (Signed)
Transesophageal Echocardiogram :  Indication: endocarditis, bacteremia Requesting/ordering  physician:   Procedure: Benzocaine spray x2 and 2 mls x 2 of viscous lidocaine were given orally to provide local anesthesia to the oropharynx. The patient was positioned supine on the left side, bite block provided. The patient was moderately sedated with the doses of versed and fentanyl as detailed below.  Using digital technique an omniplane probe was advanced into the distal esophagus without incident.   Moderate sedation: 1. Sedation used:  per anesthesia   See report in EPIC  for complete details: In brief,  No valve vegetation concerning for endocarditis transgastric imaging revealed normal LV function with no RWMAs and no mural apical thrombus.  .  Estimated ejection fraction was 55-60%.  Right sided cardiac chambers were normal with no evidence of pulmonary hypertension.  Imaging of the septum showed no ASD or VSD Bubble study was negative for shunt 2D and color flow confirmed no PFO  The LA was well visualized in orthogonal views.  There was no spontaneous contrast and no thrombus in the LA and LA appendage   The descending thoracic aorta had no  mural aortic debris with no evidence of aneurysmal dilation or disection   Karen Clarke 02/09/2021 9:43 AM

## 2021-02-10 DIAGNOSIS — R7881 Bacteremia: Secondary | ICD-10-CM | POA: Diagnosis not present

## 2021-02-10 LAB — CULTURE, BLOOD (ROUTINE X 2)

## 2021-02-10 NOTE — Progress Notes (Signed)
Patient ID: Karen Clarke, female   DOB: 10/10/1985, 35 y.o.   MRN: [id number]     SURGICAL PROGRESS NOTE   Hospital Day(s): 3.   Interval History: Patient seen and examined, no acute events or new complaints overnight. Patient reports feeling better. Endorses that the abscess area is healing well. No complain of pain on that area.   Vital signs in last 24 hours: [min-max] current  Temp:  [97.9 F (36.6 C)-98.7 F (37.1 C)] 97.9 F (36.6 C) (12/10 0744) Pulse Rate:  [74-83] 75 (12/10 0744) Resp:  [14-17] 15 (12/10 0744) BP: (94-112)/(50-66) 98/56 (12/10 0744) SpO2:  [97 %-100 %] 97 % (12/10 0744)     Height: 5\' 3"  (160 cm) Weight: 90.7 kg BMI (Calculated): 35.44   Physical Exam:  Constitutional: alert, cooperative and no distress  Respiratory: breathing non-labored at rest  Cardiovascular: regular rate and sinus rhythm  Gastrointestinal: soft, non-tender, and non-distended Extremity: right forearm with small abscess, minimal fluctuance area. Localized erythema.   Labs:  CBC Latest Ref Rng & Units 02/09/2021 02/08/2021 02/07/2021  WBC 4.0 - 10.5 K/uL 5.7 6.1 7.2  Hemoglobin 12.0 - 15.0 g/dL 10.3(L) 10.5(L) 9.1(L)  Hematocrit 36.0 - 46.0 % 33.2(L) 33.6(L) 29.3(L)  Platelets 150 - 400 K/uL 354 347 344   CMP Latest Ref Rng & Units 02/09/2021 02/08/2021 02/07/2021  Glucose 70 - 99 mg/dL 95 14/09/2020) 009(F)  BUN 6 - 20 mg/dL 11 13 15   Creatinine 0.44 - 1.00 mg/dL 818(E 9.93  Sodium 135 - 145 mmol/L 138 136 134(L)  Potassium 3.5 - 5.1 mmol/L 4.1 4.1 3.7  Chloride 98 - 111 mmol/L 105 107 105  CO2 22 - 32 mmol/L 25 22 23   Calcium 8.9 - 10.3 mg/dL 7.16) 9.67) )  Total Protein 6.5 - 8.1 g/dL - - -  Total Bilirubin 0.3 - 1.2 mg/dL - - -  Alkaline Phos 38 - 126 U/L - - -  AST 15 - 41 U/L - - -  ALT 0 - 44 U/L - - -    Imaging studies: No new pertinent imaging studies   Assessment/Plan:  35 y.o. female with bacteremia, complicated by pertinent comorbidities including IV drug  abuse.  Abscess continue to heal adequately with IV abx therapy. This abscess is healing the same way that other infected area has healed. Patient without any pain. No fever. Normal WBC count. Patient can continue current therapy. No indication for I&D. Patient agreed with plan. If there is any change of this abscess, may contact me for re evaluation.   Case discussed with Hospitalist.   8.1(O, MD

## 2021-02-10 NOTE — Discharge Summary (Signed)
Karen Clarke [id number] DOB: 11-04-1985 DOA: 02/07/2021  PCP: Patient, No Pcp Per (Inactive)  Admit date: 02/07/2021 Discharge date: 02/10/2021  Time spent: 35 minutes  Recommendations for Outpatient Follow-up:  Return to hospital for IV antibiotics     Discharge Diagnoses:  Principal Problem:   Bacteremia Active Problems:   Cellulitis of right lower extremity   Hx of bacterial endocarditis   IVDU (intravenous drug user)   Tobacco abuse   Discharge Condition: stable   Filed Weights   02/06/21 2056  Weight: 90.7 kg    History of present illness:  Karen Clarke is a 35 y.o. 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  She admits to night sweats and with her fever and chills.  She has been having occasional nausea and vomiting.  She denies any abdominal pain.  She describes a sharp pleuritic chest pain which has been intermittent.  She admits to injecting cocaine and fentanyl.  She used to inject heroin and has quit 3 to 4 months ago.  She is currently on methadone followed at [facility] clinic.  No dysuria, oliguria or hematuria or flank pain.  No bleeding diathesis.    Hospital Course:  Presented concerned she was bacteremic in setting of ongoing IVDU. Indeed she was bacteremic with E. Faecalis. TTE and TEE showed no signs endocarditis (has history of such). Gen surg evaluated several small abscesses on extremities; no I and D performed as they improved with antibiotics. For bacteremia patient was initially treated with vanc/cefepime which was narrowed to ampicillin/ceftriaxone.  Patient lost IV access on 12/10 and nursing attempts to re-gain were unsuccessful. IV team could not find suitable site for peripheral or mid-line IV and advised central line. PCCM consulted for placement. Patient then abruptly decided to leave against medical advice and I spoke with her to attempt to convince her to stay but she did indeed leave [place].    Consultations: ID  Discharge Exam: Vitals:   02/10/21 1231 02/10/21 1518  BP: 131/86 126/66  Pulse: 85 (!) 107  Resp: 16 17  Temp: 98.3 F (36.8 C) 97.9 F (36.6 C)  SpO2: 99% 98%    Discharge Instructions     Allergies  Allergen Reactions   Capsaicin Hives and Itching      The results of significant diagnostics from this hospitalization (including imaging, microbiology, ancillary and laboratory) are listed below for reference.    Significant Diagnostic Studies: DG Chest 2 View  Result Date: 02/06/2021 CLINICAL DATA:  Cough and fever EXAM: CHEST - 2 VIEW COMPARISON:  09/06/2007 FINDINGS: The heart size and mediastinal contours are within normal limits. Both lungs are clear. The visualized skeletal structures are unremarkable. IMPRESSION: No active cardiopulmonary disease. Electronically Signed   By: Donavan Foil M.D.   On: 02/06/2021 21:22   CT Angio Chest PE W and/or Wo Contrast  Result Date: 02/07/2021 CLINICAL DATA:  PE suspected, high prob. 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 Multiplanar CT image  reconstructions and MIPs were obtained to evaluate the vascular anatomy. CONTRAST:  19mL OMNIPAQUE IOHEXOL 350 MG/ML SOLN COMPARISON:  None FINDINGS: Cardiovascular: Satisfactory opacification of the pulmonary arteries to the segmental level. No evidence of pulmonary embolism. Normal  heart size. No pericardial effusion. Mediastinum/Nodes: A single pathologically enlarged left subpectoral lymph node is identified measuring 15 x 22 mm at axial image # 7/4. No other pathologic thoracic adenopathy. Thyroid unremarkable. Esophagus is unremarkable. Lungs/Pleura: 5 mm subpleural indeterminate pulmonary nodule noted within the a subpleural right upper lobe at axial image # 41/6. The lungs are otherwise clear. No pneumothorax or pleural effusion. Upper Abdomen: No acute abnormality. Musculoskeletal: No chest wall abnormality. No acute or significant osseous findings. Review of the MIP images confirms the above findings. IMPRESSION: No pulmonary embolism. Isolated, pathologically enlarged left subpectoral lymph node. Given the patient's history, this may be reactive in nature. However, correlation with left breast examination and possible mammography may be helpful for further evaluation. 5 mm indeterminate right upper lobe pulmonary nodule. No follow-up needed if patient is low-risk. Non-contrast chest CT can be considered in 12 months if patient is high-risk. This recommendation follows the consensus statement: Guidelines for Management of Incidental Pulmonary Nodules Detected on CT Images: From the Fleischner Society 2017; Radiology 2017; 284:228-243. Electronically Signed   By: Fidela Salisbury M.D.   On: 02/07/2021 02:32   ECHOCARDIOGRAM COMPLETE  Result Date: 02/07/2021    ECHOCARDIOGRAM REPORT   Patient Name:   Karen Clarke Date of Exam: 02/07/2021 Medical Rec #:  [id number]  Height:       63.0 in Accession #:    [id number] Weight:       200.0 lb Date of Birth:  1985/07/22  BSA:          1.934 m Patient Age:    70 years   BP:           116/55 mmHg Patient Gender: F          HR:           67 bpm. Exam Location:  [facility] Procedure: 2D Echo, Color Doppler and Cardiac Doppler Indications:     R06.00 Dyspnea  History:         Patient has no prior history of Echocardiogram examinations.                   Lupus; Risk Factors:Current Smoker. Hx of polysubstance abuse.  Sonographer:     Charmayne Sheer Referring Phys:  [id number] JAN A MANSY Diagnosing Phys: Kate Sable MD  Sonographer Comments: Suboptimal apical window and suboptimal subcostal window. IMPRESSIONS  1. Left ventricular ejection fraction, by estimation, is 60 to 65%. The left ventricle has normal function. The left ventricle has no regional wall motion abnormalities. Left ventricular diastolic parameters were normal.  2. Right ventricular systolic function is normal. The right ventricular size is normal.  3. The mitral valve is normal in structure. No evidence of mitral valve regurgitation.  4. The aortic valve is tricuspid. Aortic valve regurgitation is not visualized. FINDINGS  Left Ventricle: Left ventricular ejection fraction, by estimation, is 60 to 65%. The left ventricle has normal function. The left ventricle has no regional wall motion abnormalities. The left ventricular internal cavity size was normal in size. There is  no left ventricular hypertrophy. Left ventricular diastolic parameters were normal. Right Ventricle: The right ventricular size is normal. No increase in right ventricular wall thickness. Right ventricular systolic function is normal. Left Atrium: Left atrial  size was normal in size. Right Atrium: Right atrial size was normal in size. Pericardium: There is no evidence of pericardial effusion. Mitral Valve: The mitral valve is normal in structure. No evidence of mitral valve regurgitation. MV peak gradient, 3.8 mmHg. The mean mitral valve gradient is 1.0 mmHg. Tricuspid Valve: The tricuspid valve is normal in structure. Tricuspid valve regurgitation is not demonstrated. Aortic Valve: The aortic valve is tricuspid. Aortic valve regurgitation is not visualized. Aortic valve mean gradient measures 3.0 mmHg. Aortic valve peak gradient measures 4.7 mmHg. Aortic valve area, by VTI measures 2.70 cm. Pulmonic Valve: The pulmonic valve was  normal in structure. Pulmonic valve regurgitation is not visualized. Aorta: The aortic root is normal in size and structure. Venous: The inferior vena cava was not well visualized. IAS/Shunts: No atrial level shunt detected by color flow Doppler.  LEFT VENTRICLE PLAX 2D LVIDd:         5.30 cm   Diastology LVIDs:         3.55 cm   LV e' medial:    13.90 cm/s LV PW:         0.83 cm   LV E/e' medial:  6.4 LV IVS:        0.63 cm   LV e' lateral:   14.90 cm/s LVOT diam:     1.90 cm   LV E/e' lateral: 6.0 LV SV:         58 LV SV Index:   30 LVOT Area:     2.84 cm  RIGHT VENTRICLE RV Basal diam:  2.88 cm LEFT ATRIUM             Index        RIGHT ATRIUM           Index LA diam:        3.10 cm 1.60 cm/m   RA Area:     15.30 cm LA Vol (A2C):   39.0 ml 20.17 ml/m  RA Volume:   41.30 ml  21.36 ml/m LA Vol (A4C):   40.3 ml 20.84 ml/m LA Biplane Vol: 40.5 ml 20.94 ml/m  AORTIC VALVE                    PULMONIC VALVE AV Area (Vmax):    2.78 cm     PV Vmax:       0.93 m/s AV Area (Vmean):   2.69 cm     PV Vmean:      63.600 cm/s AV Area (VTI):     2.70 cm     PV VTI:        0.207 m AV Vmax:           108.00 cm/s  PV Peak grad:  3.5 mmHg AV Vmean:          75.400 cm/s  PV Mean grad:  2.0 mmHg AV VTI:            0.215 m AV Peak Grad:      4.7 mmHg AV Mean Grad:      3.0 mmHg LVOT Vmax:         106.00 cm/s LVOT Vmean:        71.500 cm/s LVOT VTI:          0.205 m LVOT/AV VTI ratio: 0.95  AORTA Ao Root diam: 2.90 cm MITRAL VALVE MV Area (PHT): 2.85 cm    SHUNTS MV Area VTI:   1.99 cm    Systemic  VTI:  0.20 m MV Peak grad:  3.8 mmHg    Systemic Diam: 1.90 cm MV Mean grad:  1.0 mmHg MV Vmax:       0.98 m/s MV Vmean:      49.8 cm/s MV Decel Time: 266 msec MV E velocity: 89.10 cm/s MV A velocity: 39.00 cm/s MV E/A ratio:  2.28 Kate Sable MD Electronically signed by Kate Sable MD Signature Date/Time: 02/07/2021/4:46:45 PM    Final    ECHO TEE  Result Date: 02/09/2021    TRANSESOPHOGEAL ECHO REPORT   Patient  Name:   Karen Clarke Date of Exam: 02/09/2021 Medical Rec #:  [id number]  Height:       63.0 in Accession #:    [id number] Weight:       200.0 lb Date of Birth:  Jul 22, 1985  BSA:          1.934 m Patient Age:    34 years   BP:           104/60 mmHg Patient Gender: F          HR:           56 bpm. Exam Location:  [facility] Procedure: Transesophageal Echo, Limited Color Doppler and Saline Contrast            Bubble Study Indications:     Bacteremia  History:         Patient has prior history of Echocardiogram examinations, most                  recent 02/07/2021. Lupus. Hx of polysubstance abuse.  Sonographer:     Charmayne Sheer Referring Phys:  [place] Diagnosing Phys: Ida Rogue MD PROCEDURE: TEE procedure time was 30 minutes. The transesophogeal probe was passed without difficulty through the esophogus of the patient. Imaged were obtained with the patient in a left lateral decubitus position. Local oropharyngeal anesthetic was provided with Benzocaine spray and Cetacaine. Sedation performed by different physician. Image quality was excellent. The patient's vital signs; including heart rate, blood pressure, and oxygen saturation; remained stable throughout the procedure. The patient developed no complications during the procedure. IMPRESSIONS  1. No valve vegetation noted.  2. Left ventricular ejection fraction, by estimation, is 60 to 65%. The left ventricle has normal function. The left ventricle has no regional wall motion abnormalities.  3. Right ventricular systolic function is normal. The right ventricular size is normal.  4. No left atrial/left atrial appendage thrombus was detected.  5. The mitral valve is normal in structure. Trivial mitral valve regurgitation. No evidence of mitral stenosis.  6. The aortic valve is normal in structure. Aortic valve regurgitation is not visualized. No aortic stenosis is present.  7. The inferior vena cava is normal in size with greater than 50% respiratory variability,  suggesting right atrial pressure of 3 mmHg.  8. Agitated saline contrast bubble study was negative, with no evidence of any interatrial shunt. Conclusion(s)/Recommendation(s): Normal biventricular function without evidence of hemodynamically significant valvular heart disease. FINDINGS  Left Ventricle: Left ventricular ejection fraction, by estimation, is 60 to 65%. The left ventricle has normal function. The left ventricle has no regional wall motion abnormalities. The left ventricular internal cavity size was normal in size. There is  no left ventricular hypertrophy. Right Ventricle: The right ventricular size is normal. No increase in right ventricular wall thickness. Right ventricular systolic function is normal. Left Atrium: Left atrial size was normal in size. No left atrial/left atrial  appendage thrombus was detected. Right Atrium: Right atrial size was normal in size. Pericardium: There is no evidence of pericardial effusion. Mitral Valve: The mitral valve is normal in structure. Trivial mitral valve regurgitation. No evidence of mitral valve stenosis. Tricuspid Valve: The tricuspid valve is normal in structure. Tricuspid valve regurgitation is trivial. No evidence of tricuspid stenosis. Aortic Valve: The aortic valve is normal in structure. Aortic valve regurgitation is not visualized. No aortic stenosis is present. Pulmonic Valve: The pulmonic valve was normal in structure. Pulmonic valve regurgitation is not visualized. No evidence of pulmonic stenosis. Aorta: The aortic root is normal in size and structure. Venous: The inferior vena cava is normal in size with greater than 50% respiratory variability, suggesting right atrial pressure of 3 mmHg. IAS/Shunts: No atrial level shunt detected by color flow Doppler. Agitated saline contrast was given intravenously to evaluate for intracardiac shunting. Agitated saline contrast bubble study was negative, with no evidence of any interatrial shunt. Ida Rogue  MD Electronically signed by Ida Rogue MD Signature Date/Time: 02/09/2021/6:33:33 PM    Final     Microbiology: Recent Results (from the past 240 hour(s))  Resp Panel by RT-PCR (Flu A&B, Covid) Nasopharyngeal Swab     Status: None   Collection Time: 02/07/21 12:17 AM   Specimen: Nasopharyngeal Swab; Nasopharyngeal(NP) swabs in vial transport medium  Result Value Ref Range Status   SARS Coronavirus 2 by RT PCR NEGATIVE NEGATIVE Final    Comment: (NOTE) SARS-CoV-2 target nucleic acids are NOT DETECTED.  The SARS-CoV-2 RNA is generally detectable in upper respiratory specimens during the acute phase of infection. The lowest concentration of SARS-CoV-2 viral copies this assay can detect is 138 copies/mL. A negative result does not preclude SARS-Cov-2 infection and should not be used as the sole basis for treatment or other patient management decisions. A negative result may occur with  improper specimen collection/handling, submission of specimen other than nasopharyngeal swab, presence of viral mutation(s) within the areas targeted by this assay, and inadequate number of viral copies(<138 copies/mL). A negative result must be combined with clinical observations, patient history, and epidemiological information. The expected result is Negative.  Fact Sheet for Patients:  EntrepreneurPulse.com.au  Fact Sheet for Healthcare Providers:  IncredibleEmployment.be  This test is no t yet approved or cleared by the [place] FDA and  has been authorized for detection and/or diagnosis of SARS-CoV-2 by FDA under an Emergency Use Authorization (EUA). This EUA will remain  in effect (meaning this test can be used) for the duration of the COVID-19 declaration under Section 564(b)(1) of the Act, 21 U.S.C.section 360bbb-3(b)(1), unless the authorization is terminated  or revoked sooner.       Influenza A by PCR NEGATIVE NEGATIVE Final   Influenza B by  PCR NEGATIVE NEGATIVE Final    Comment: (NOTE) The Xpert Xpress SARS-CoV-2/FLU/RSV plus assay is intended as an aid in the diagnosis of influenza from Nasopharyngeal swab specimens and should not be used as a sole basis for treatment. Nasal washings and aspirates are unacceptable for Xpert Xpress SARS-CoV-2/FLU/RSV testing.  Fact Sheet for Patients: EntrepreneurPulse.com.au  Fact Sheet for Healthcare Providers: IncredibleEmployment.be  This test is not yet approved or cleared by the [place] FDA and has been authorized for detection and/or diagnosis of SARS-CoV-2 by FDA under an Emergency Use Authorization (EUA). This EUA will remain in effect (meaning this test can be used) for the duration of the COVID-19 declaration under Section 564(b)(1) of the Act, 21 U.S.C. section 360bbb-3(b)(1), unless  the authorization is terminated or revoked.  Performed at [facility], [address] [place], [place] [address]   Culture, blood (Routine X 2) w Reflex to ID Panel     Status: Abnormal   Collection Time: 02/07/21  1:55 AM   Specimen: BLOOD  Result Value Ref Range Status   Specimen Description   Final    BLOOD RIGHT ARM Performed at [facility], [address], [place], [place] [address]    Special Requests   Final    BOTTLES DRAWN AEROBIC AND ANAEROBIC Blood Culture results may not be optimal due to an inadequate volume of blood received in culture bottles Performed at [facility], [address] [place], [place] [address]    Culture  Setup Time   Final    GRAM POSITIVE COCCI IN BOTH AEROBIC AND ANAEROBIC BOTTLES Organism ID to follow CRITICAL RESULT CALLED TO, READ BACK BY AND VERIFIED WITH: [email or web address]  02/07/21 rh    Culture ENTEROCOCCUS FAECALIS (A)  Final   Report Status 02/10/2021 FINAL  Final   Organism ID, Bacteria ENTEROCOCCUS FAECALIS  Final      Susceptibility   Enterococcus faecalis - MIC*     AMPICILLIN <=2 SENSITIVE Sensitive     VANCOMYCIN 1 SENSITIVE Sensitive     GENTAMICIN SYNERGY SENSITIVE Sensitive     * ENTEROCOCCUS FAECALIS  Culture, blood (Routine X 2) w Reflex to ID Panel     Status: None (Preliminary result)   Collection Time: 02/07/21  1:55 AM   Specimen: BLOOD  Result Value Ref Range Status   Specimen Description BLOOD RIGHT ARM  Final   Special Requests   Final    BOTTLES DRAWN AEROBIC AND ANAEROBIC Blood Culture adequate volume   Culture   Final    NO GROWTH 3 DAYS Performed at [facility], [address] [place], [place] [address]    Report Status PENDING  Incomplete  Blood Culture ID Panel (Reflexed)     Status: Abnormal   Collection Time: 02/07/21  1:55 AM  Result Value Ref Range Status   Enterococcus faecalis DETECTED (A) NOT DETECTED Final    Comment: CRITICAL RESULT CALLED TO, READ BACK BY AND VERIFIED WITH: [email or web address]  02/07/21 RH    Enterococcus Faecium NOT DETECTED NOT DETECTED Final   Listeria monocytogenes NOT DETECTED NOT DETECTED Final   Staphylococcus species NOT DETECTED NOT DETECTED Final   Staphylococcus aureus (BCID) NOT DETECTED NOT DETECTED Final   Staphylococcus epidermidis NOT DETECTED NOT DETECTED Final   Staphylococcus lugdunensis NOT DETECTED NOT DETECTED Final   Streptococcus species NOT DETECTED NOT DETECTED Final   Streptococcus agalactiae NOT DETECTED NOT DETECTED Final   Streptococcus pneumoniae NOT DETECTED NOT DETECTED Final   Streptococcus pyogenes NOT DETECTED NOT DETECTED Final   A.calcoaceticus-baumannii NOT DETECTED NOT DETECTED Final   Bacteroides fragilis NOT DETECTED NOT DETECTED Final   Enterobacterales NOT DETECTED NOT DETECTED Final   Enterobacter cloacae complex NOT DETECTED NOT DETECTED Final   Escherichia coli NOT DETECTED NOT DETECTED Final   Klebsiella aerogenes NOT DETECTED NOT DETECTED Final   Klebsiella oxytoca NOT DETECTED NOT DETECTED Final   Klebsiella pneumoniae NOT DETECTED  NOT DETECTED Final   Proteus species NOT DETECTED NOT DETECTED Final   Salmonella species NOT DETECTED NOT DETECTED Final   Serratia marcescens NOT DETECTED NOT DETECTED Final   Haemophilus influenzae NOT DETECTED NOT DETECTED Final   Neisseria meningitidis NOT DETECTED NOT DETECTED Final   Pseudomonas aeruginosa NOT  DETECTED NOT DETECTED Final   Stenotrophomonas maltophilia NOT DETECTED NOT DETECTED Final   Candida albicans NOT DETECTED NOT DETECTED Final   Candida auris NOT DETECTED NOT DETECTED Final   Candida glabrata NOT DETECTED NOT DETECTED Final   Candida krusei NOT DETECTED NOT DETECTED Final   Candida parapsilosis NOT DETECTED NOT DETECTED Final   Candida tropicalis NOT DETECTED NOT DETECTED Final   Cryptococcus neoformans/gattii NOT DETECTED NOT DETECTED Final   Vancomycin resistance NOT DETECTED NOT DETECTED Final    Comment: Performed at [facility], [address] [place], [place] [address]     Labs: Basic Metabolic Panel: Recent Labs  Lab 02/06/21 2145 02/07/21 0427 02/08/21 1247 02/09/21 0747  NA 133* 134* 136 138  K 4.8 3.7 4.1 4.1  CL 103 105 107 105  CO2 23 23 22 25   GLUCOSE 103* 112* 105* 95  BUN 21* 15 13 11   CREATININE 0.96 0.72 0.83 0.79  CALCIUM 8.9 8.4* 8.8* 8.6*   Liver Function Tests: Recent Labs  Lab 02/06/21 2145  AST 25  ALT 26  ALKPHOS 147*  BILITOT 0.3  PROT 7.9  ALBUMIN 3.9   No results for input(s): LIPASE, AMYLASE in the last 168 hours. No results for input(s): AMMONIA in the last 168 hours. CBC: Recent Labs  Lab 02/06/21 2145 02/07/21 0427 02/08/21 1247 02/09/21 0747  WBC 10.9* 7.2 6.1 5.7  NEUTROABS 8.2*  --   --   --   HGB 10.3* 9.1* 10.5* 10.3*  HCT 33.6* 29.3* 33.6* 33.2*  MCV 82.0 81.4 81.4 81.4  PLT 401* 344 347 354   Cardiac Enzymes: No results for input(s): CKTOTAL, CKMB, CKMBINDEX, TROPONINI in the last 168 hours. BNP: BNP (last 3 results) No results for input(s): BNP in the last 8760  hours.  ProBNP (last 3 results) No results for input(s): PROBNP in the last 8760 hours.  CBG: No results for input(s): GLUCAP in the last 168 hours.     Signed:  Desma Maxim MD.  [facility] 02/10/2021, 5:38 PM

## 2021-02-10 NOTE — Progress Notes (Signed)
Loss Iv access could not hang 1400 abx, attempted to start another PIV unsuccessful contacted IV team and there was no suitable veins to access due to the conditions  of pt veins and sores, Md placed orders for a tunneled line to be placed pt was upset that Iv team nurse did not attempt to stick even with [place] and wanted to leave AMA contacted Dr. Ashok Pall and pt is leaving AMA pt left by exit door beside rm 147 without signing paperwork. Md aware

## 2021-02-10 NOTE — Progress Notes (Signed)
PROGRESS NOTE    Karen Clarke  [id number] DOB: [birth year] DOA: 02/07/2021 PCP: Patient, No Pcp Per (Inactive)  Outpatient Specialists: none    Brief Narrative:  From admission h and p Karen Clarke is a 35 y.o. 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  She admits to night sweats and with her fever and chills.  She has been having occasional nausea and vomiting.  She denies any abdominal pain.  She describes a sharp pleuritic chest pain which has been intermittent.  She admits to injecting cocaine and fentanyl.  She used to inject heroin and has quit 3 to 4 months ago.  She is currently on methadone followed at [facility].  No dysuria, oliguria or hematuria or flank pain.  No bleeding diathesis.   Assessment & Plan:   Principal Problem:   Bacteremia Active Problems:   Cellulitis of right lower extremity   Hx of bacterial endocarditis   IVDU (intravenous drug user)   Tobacco abuse  # Bacteremia secondary to E. faecalis Here with subjective fever, chills, and malaise, consistent w/ prior episodes of bacteremia, in the setting of ongoing IVDU. Anaerobic and aerobic bottles growing e. Faecalis, pan-sensitive. Recent MSSA bacteremia - vanc/cefepime have been narrowed by ID to amp/ceftriaxone.  - per ID given ongoing IVDU will need to stay inpatient for duration of abx likely 4-6 wks  # Cellulitis/Abscess Multiple sites of cellulitis/abscess on extremities. Appear to be responding to IV abx. Gen surg following, advising iv abx for now, re-consult if worsening.  # History  right-sided endocarditis TTE and TEE both negative  # History right finger flexor tenosynovitis S/p amputation. - outpt hand surgery f/u  # Chronic non-healing leg ulcers Previously seen by derm, possible pyogenic granuloma which is a dx of exclusion - will need outpt derm f/u  # Opioid use disorder Hep c antibody positive, rna neg recent hospitalization. Hiv neg - cont home methadone  # MDD, PTSD - cont home seroquel, citalopram, prazosin  # Left subpectoral node and right upper lung nodule - outpt repeat imaging  DVT prophylaxis: lovenox Code Status: full Family Communication: none @ bedside. Declines offer to call and update family  Level of care: Med-Surg Status is: Inpatient  Remains inpatient appropriate because: need for IV abx        Consultants:  ID, gen surg, cardiology  Procedures: none  Antimicrobials:  S/p vanc/cefepime Ampicillin/ceftriaxone >    Subjective: Feeling well. Tolerating diet.   Objective: Vitals:   02/09/21 2135 02/10/21 0600 02/10/21 0744 02/10/21 1231  BP: 108/61 (!) 99/56 (!) 98/56 131/86  Pulse:  74 75 85  Resp:  16 15 16   Temp:  98.7 F (37.1 C) 97.9 F (36.6 C) 98.3 F (36.8 C)  TempSrc:  Oral    SpO2:   97% 99%  Weight:      Height:        Intake/Output Summary (Last 24 hours) at 02/10/2021 1422 Last data filed at 02/09/2021 1853 Gross per 24 hour  Intake 1140 ml  Output --  Net 1140 ml   Filed Weights   02/06/21 2056  Weight: 90.7 kg  Examination:  General exam: Appears calm and comfortable  Respiratory system: Clear to auscultation. Respiratory effort normal. Cardiovascular system: S1 & S2 heard, RRR. No JVD, murmurs, rubs, gallops or clicks. No pedal edema. Gastrointestinal system: Abdomen is nondistended, soft and nontender. No organomegaly or masses felt. Normal bowel sounds heard. Central nervous system: Alert and oriented. No focal neurological deficits. Extremities: Symmetric 5 x 5  power. Skin: on extremities there are ulcers lower extremities and scattered areas of erythema/swelling consistent w/ cellulitis/abscess, improving Psychiatry: Judgement and insight appear normal. Mood & affect appropriate.     Data Reviewed: I have personally reviewed following labs and imaging studies  CBC: Recent Labs  Lab 02/06/21 2145 02/07/21 0427 02/08/21 1247 02/09/21 0747  WBC 10.9* 7.2 6.1 5.7  NEUTROABS 8.2*  --   --   --   HGB 10.3* 9.1* 10.5* 10.3*  HCT 33.6* 29.3* 33.6* 33.2*  MCV 82.0 81.4 81.4 81.4  PLT 401* 344 347 354   Basic Metabolic Panel: Recent Labs  Lab 02/06/21 2145 02/07/21 0427 02/08/21 1247 02/09/21 0747  NA 133* 134* 136 138  K 4.8 3.7 4.1 4.1  CL 103 105 107 105  CO2 23 23 22 25   GLUCOSE 103* 112* 105* 95  BUN 21* 15 13 11   CREATININE 0.96 0.72 0.83 0.79  CALCIUM 8.9 8.4* 8.8* 8.6*   GFR: Estimated Creatinine Clearance: 106.9 mL/min (by C-G formula based on SCr of 0.79 mg/dL). Liver Function Tests: Recent Labs  Lab 02/06/21 2145  AST 25  ALT 26  ALKPHOS 147*  BILITOT 0.3  PROT 7.9  ALBUMIN 3.9   No results for input(s): LIPASE, AMYLASE in the last 168 hours. No results for input(s): AMMONIA in the last 168 hours. Coagulation Profile: No results for input(s): INR, PROTIME in the last 168 hours. Cardiac Enzymes: No results for input(s): CKTOTAL, CKMB, CKMBINDEX, TROPONINI in the last 168 hours. BNP (last 3 results) No results for input(s): PROBNP in the last 8760 hours. HbA1C: No results for input(s): HGBA1C in the last 72 hours. CBG: No results for input(s): GLUCAP in the last 168 hours. Lipid Profile: No results for input(s): CHOL, HDL, LDLCALC, TRIG, CHOLHDL, LDLDIRECT in the last 72 hours. Thyroid Function Tests: No results for input(s): TSH, T4TOTAL, FREET4, T3FREE, THYROIDAB in the last 72 hours. Anemia Panel: No results for input(s): VITAMINB12, FOLATE, FERRITIN, TIBC, IRON, RETICCTPCT in the last 72 hours. Urine  analysis:    Component Value Date/Time   COLORURINE YELLOW 02/07/2021 0017   APPEARANCEUR HAZY (A) 02/07/2021 0017   LABSPEC 1.025 02/07/2021 0017   PHURINE 5.5 02/07/2021 0017   GLUCOSEU NEGATIVE 02/07/2021 0017   HGBUR NEGATIVE 02/07/2021 0017   BILIRUBINUR NEGATIVE 02/07/2021 0017   KETONESUR NEGATIVE 02/07/2021 0017   PROTEINUR NEGATIVE 02/07/2021 0017   UROBILINOGEN 0.2 02/21/2009 1848   NITRITE NEGATIVE 02/07/2021 0017   LEUKOCYTESUR SMALL (A) 02/07/2021 0017   Sepsis Labs: @LABRCNTIP (procalcitonin:4,lacticidven:4)  ) Recent Results (from the past 240 hour(s))  Resp Panel by RT-PCR (Flu A&B, Covid) Nasopharyngeal Swab     Status: None   Collection Time: 02/07/21 12:17 AM   Specimen: Nasopharyngeal Swab; Nasopharyngeal(NP) swabs in vial transport medium  Result Value Ref Range Status   SARS Coronavirus 2 by RT PCR NEGATIVE NEGATIVE Final    Comment: (NOTE) SARS-CoV-2 target nucleic acids are NOT DETECTED.  The SARS-CoV-2 RNA is generally detectable in upper respiratory specimens during the acute phase of infection. The lowest concentration of SARS-CoV-2 viral copies this assay can  detect is 138 copies/mL. A negative result does not preclude SARS-Cov-2 infection and should not be used as the sole basis for treatment or other patient management decisions. A negative result may occur with  improper specimen collection/handling, submission of specimen other than nasopharyngeal swab, presence of viral mutation(s) within the areas targeted by this assay, and inadequate number of viral copies(<138 copies/mL). A negative result must be combined with clinical observations, patient history, and epidemiological information. The expected result is Negative.  Fact Sheet for Patients:  BloggerCourse.com  Fact Sheet for Healthcare Providers:  SeriousBroker.it  This test is no t yet approved or cleared by the [place] FDA and   has been authorized for detection and/or diagnosis of SARS-CoV-2 by FDA under an Emergency Use Authorization (EUA). This EUA will remain  in effect (meaning this test can be used) for the duration of the COVID-19 declaration under Section 564(b)(1) of the Act, 21 U.S.C.section 360bbb-3(b)(1), unless the authorization is terminated  or revoked sooner.       Influenza A by PCR NEGATIVE NEGATIVE Final   Influenza B by PCR NEGATIVE NEGATIVE Final    Comment: (NOTE) The Xpert Xpress SARS-CoV-2/FLU/RSV plus assay is intended as an aid in the diagnosis of influenza from Nasopharyngeal swab specimens and should not be used as a sole basis for treatment. Nasal washings and aspirates are unacceptable for Xpert Xpress SARS-CoV-2/FLU/RSV testing.  Fact Sheet for Patients: BloggerCourse.com  Fact Sheet for Healthcare Providers: SeriousBroker.it  This test is not yet approved or cleared by the [place] FDA and has been authorized for detection and/or diagnosis of SARS-CoV-2 by FDA under an Emergency Use Authorization (EUA). This EUA will remain in effect (meaning this test can be used) for the duration of the COVID-19 declaration under Section 564(b)(1) of the Act, 21 U.S.C. section 360bbb-3(b)(1), unless the authorization is terminated or revoked.  Performed at [facility], [address] [place], [place] [address]   Culture, blood (Routine X 2) w Reflex to ID Panel     Status: Abnormal   Collection Time: 02/07/21  1:55 AM   Specimen: BLOOD  Result Value Ref Range Status   Specimen Description   Final    BLOOD RIGHT ARM Performed at [facility], [address], [place], [place] [address]    Special Requests   Final    BOTTLES DRAWN AEROBIC AND ANAEROBIC Blood Culture results may not be optimal due to an inadequate volume of blood received in culture bottles Performed at [facility], [address], [place], [place] [address]    Culture  Setup Time   Final    GRAM POSITIVE COCCI IN BOTH AEROBIC AND ANAEROBIC BOTTLES Organism ID to follow CRITICAL RESULT CALLED TO, READ BACK BY AND VERIFIED WITH: [email or web address]  02/07/21 rh    Culture ENTEROCOCCUS FAECALIS (A)  Final   Report Status 02/10/2021 FINAL  Final   Organism ID, Bacteria ENTEROCOCCUS FAECALIS  Final      Susceptibility   Enterococcus faecalis - MIC*    AMPICILLIN <=2 SENSITIVE Sensitive     VANCOMYCIN 1 SENSITIVE Sensitive     GENTAMICIN SYNERGY SENSITIVE Sensitive     * ENTEROCOCCUS FAECALIS  Culture, blood (Routine X 2) w Reflex to ID Panel     Status: None (Preliminary result)   Collection Time: 02/07/21  1:55 AM   Specimen: BLOOD  Result Value Ref Range Status   Specimen Description BLOOD RIGHT ARM  Final   Special Requests  Final    BOTTLES DRAWN AEROBIC AND ANAEROBIC Blood Culture adequate volume   Culture   Final    NO GROWTH 3 DAYS Performed at [facility], [address] [place], [place] [address]    Report Status PENDING  Incomplete  Blood Culture ID Panel (Reflexed)     Status: Abnormal   Collection Time: 02/07/21  1:55 AM  Result Value Ref Range Status   Enterococcus faecalis DETECTED (A) NOT DETECTED Final    Comment: CRITICAL RESULT CALLED TO, READ BACK BY AND VERIFIED WITH: [email or web address]  02/07/21 RH    Enterococcus Faecium NOT DETECTED NOT DETECTED Final   Listeria monocytogenes NOT DETECTED NOT DETECTED Final   Staphylococcus species NOT DETECTED NOT DETECTED Final   Staphylococcus aureus (BCID) NOT DETECTED NOT DETECTED Final   Staphylococcus epidermidis NOT DETECTED NOT DETECTED Final   Staphylococcus lugdunensis NOT DETECTED NOT DETECTED Final   Streptococcus species NOT DETECTED NOT DETECTED Final   Streptococcus agalactiae NOT DETECTED NOT DETECTED Final   Streptococcus pneumoniae NOT DETECTED NOT DETECTED Final   Streptococcus pyogenes NOT DETECTED NOT DETECTED Final    A.calcoaceticus-baumannii NOT DETECTED NOT DETECTED Final   Bacteroides fragilis NOT DETECTED NOT DETECTED Final   Enterobacterales NOT DETECTED NOT DETECTED Final   Enterobacter cloacae complex NOT DETECTED NOT DETECTED Final   Escherichia coli NOT DETECTED NOT DETECTED Final   Klebsiella aerogenes NOT DETECTED NOT DETECTED Final   Klebsiella oxytoca NOT DETECTED NOT DETECTED Final   Klebsiella pneumoniae NOT DETECTED NOT DETECTED Final   Proteus species NOT DETECTED NOT DETECTED Final   Salmonella species NOT DETECTED NOT DETECTED Final   Serratia marcescens NOT DETECTED NOT DETECTED Final   Haemophilus influenzae NOT DETECTED NOT DETECTED Final   Neisseria meningitidis NOT DETECTED NOT DETECTED Final   Pseudomonas aeruginosa NOT DETECTED NOT DETECTED Final   Stenotrophomonas maltophilia NOT DETECTED NOT DETECTED Final   Candida albicans NOT DETECTED NOT DETECTED Final   Candida auris NOT DETECTED NOT DETECTED Final   Candida glabrata NOT DETECTED NOT DETECTED Final   Candida krusei NOT DETECTED NOT DETECTED Final   Candida parapsilosis NOT DETECTED NOT DETECTED Final   Candida tropicalis NOT DETECTED NOT DETECTED Final   Cryptococcus neoformans/gattii NOT DETECTED NOT DETECTED Final   Vancomycin resistance NOT DETECTED NOT DETECTED Final    Comment: Performed at [facility], [address] [place], [place] [address]         Radiology Studies: ECHO TEE  Result Date: 02/09/2021    TRANSESOPHOGEAL ECHO REPORT   Patient Name:   Karen Clarke Date of Exam: 02/09/2021 Medical Rec #:  [id number]  Height:       63.0 in Accession #:    [id number] Weight:       200.0 lb Date of Birth:  01-Feb-1986  BSA:          1.934 m Patient Age:    35 years   BP:           104/60 mmHg Patient Gender: F          HR:           56 bpm. Exam Location:  [facility] Procedure: Transesophageal Echo, Limited Color Doppler and Saline Contrast            Bubble Study Indications:     Bacteremia  History:          Patient has prior history of Echocardiogram examinations, most  recent 02/07/2021. Lupus. Hx of polysubstance abuse.  Sonographer:     Charmayne Sheer Referring Phys:  [place] Diagnosing Phys: Ida Rogue MD PROCEDURE: TEE procedure time was 30 minutes. The transesophogeal probe was passed without difficulty through the esophogus of the patient. Imaged were obtained with the patient in a left lateral decubitus position. Local oropharyngeal anesthetic was provided with Benzocaine spray and Cetacaine. Sedation performed by different physician. Image quality was excellent. The patient's vital signs; including heart rate, blood pressure, and oxygen saturation; remained stable throughout the procedure. The patient developed no complications during the procedure. IMPRESSIONS  1. No valve vegetation noted.  2. Left ventricular ejection fraction, by estimation, is 60 to 65%. The left ventricle has normal function. The left ventricle has no regional wall motion abnormalities.  3. Right ventricular systolic function is normal. The right ventricular size is normal.  4. No left atrial/left atrial appendage thrombus was detected.  5. The mitral valve is normal in structure. Trivial mitral valve regurgitation. No evidence of mitral stenosis.  6. The aortic valve is normal in structure. Aortic valve regurgitation is not visualized. No aortic stenosis is present.  7. The inferior vena cava is normal in size with greater than 50% respiratory variability, suggesting right atrial pressure of 3 mmHg.  8. Agitated saline contrast bubble study was negative, with no evidence of any interatrial shunt. Conclusion(s)/Recommendation(s): Normal biventricular function without evidence of hemodynamically significant valvular heart disease. FINDINGS  Left Ventricle: Left ventricular ejection fraction, by estimation, is 60 to 65%. The left ventricle has normal function. The left ventricle has no regional wall motion  abnormalities. The left ventricular internal cavity size was normal in size. There is  no left ventricular hypertrophy. Right Ventricle: The right ventricular size is normal. No increase in right ventricular wall thickness. Right ventricular systolic function is normal. Left Atrium: Left atrial size was normal in size. No left atrial/left atrial appendage thrombus was detected. Right Atrium: Right atrial size was normal in size. Pericardium: There is no evidence of pericardial effusion. Mitral Valve: The mitral valve is normal in structure. Trivial mitral valve regurgitation. No evidence of mitral valve stenosis. Tricuspid Valve: The tricuspid valve is normal in structure. Tricuspid valve regurgitation is trivial. No evidence of tricuspid stenosis. Aortic Valve: The aortic valve is normal in structure. Aortic valve regurgitation is not visualized. No aortic stenosis is present. Pulmonic Valve: The pulmonic valve was normal in structure. Pulmonic valve regurgitation is not visualized. No evidence of pulmonic stenosis. Aorta: The aortic root is normal in size and structure. Venous: The inferior vena cava is normal in size with greater than 50% respiratory variability, suggesting right atrial pressure of 3 mmHg. IAS/Shunts: No atrial level shunt detected by color flow Doppler. Agitated saline contrast was given intravenously to evaluate for intracardiac shunting. Agitated saline contrast bubble study was negative, with no evidence of any interatrial shunt. Ida Rogue MD Electronically signed by Ida Rogue MD Signature Date/Time: 02/09/2021/6:33:33 PM    Final         Scheduled Meds:  enoxaparin (LOVENOX) injection  40 mg Subcutaneous Q24H   influenza vac split quadrivalent PF  0.5 mL Intramuscular Tomorrow-1000   methadone  100 mg Oral q AM   prazosin  2 mg Oral QHS   QUEtiapine  100 mg Oral QHS   Continuous Infusions:  sodium chloride Stopped (02/08/21 0303)   ampicillin (OMNIPEN) IV 2 g  (02/10/21 0859)   cefTRIAXone (ROCEPHIN)  IV 2 g (02/10/21 1051)  LOS: 3 days    Time spent: 20 min    Desma Maxim, MD [facility]   If 7PM-7AM, please contact night-coverage [email or web address] Password [email or web address] 02/10/2021, 2:22 PM

## 2021-02-10 NOTE — Progress Notes (Signed)
A consult was placed to the IV Therapist for new IV access;  pt's history noted;  both arms assessed thoroughly with ultrasound.  No suitable veins noted;  pt has severe scar tissue, open wounds, healed wounds and bruises bilaterally; scar tissue is so severe that this writer thought the pt had a graft or fistula; multiple scars noted on her arms.  Patient is very high risk for a CLABSI or infection from a PIV .  Recommend that pt have a line placed in Radiology for long term antibiotic therapy.

## 2021-02-12 ENCOUNTER — Encounter: Payer: Self-pay | Admitting: Cardiovascular Disease

## 2021-02-12 LAB — CULTURE, BLOOD (ROUTINE X 2)
Culture: NO GROWTH
Special Requests: ADEQUATE

## 2021-02-17 ENCOUNTER — Other Ambulatory Visit: Payer: Self-pay

## 2021-02-17 ENCOUNTER — Encounter: Payer: Self-pay | Admitting: Radiology

## 2021-02-17 ENCOUNTER — Emergency Department: Payer: BLUE CROSS/BLUE SHIELD

## 2021-02-17 DIAGNOSIS — Z5321 Procedure and treatment not carried out due to patient leaving prior to being seen by health care provider: Secondary | ICD-10-CM | POA: Insufficient documentation

## 2021-02-17 DIAGNOSIS — R5383 Other fatigue: Secondary | ICD-10-CM | POA: Insufficient documentation

## 2021-02-17 DIAGNOSIS — R7881 Bacteremia: Secondary | ICD-10-CM | POA: Insufficient documentation

## 2021-02-17 DIAGNOSIS — I38 Endocarditis, valve unspecified: Secondary | ICD-10-CM | POA: Insufficient documentation

## 2021-02-17 DIAGNOSIS — R531 Weakness: Secondary | ICD-10-CM | POA: Insufficient documentation

## 2021-02-17 IMAGING — CR DG CHEST 2V
2 series · 2 of 2 positions shown · non-contrast
Comparison: [DATE]

CLINICAL DATA: Increased weakness and fatigue, history of recently
leaving AMA with diagnosis of bacteremia and endocarditis.

EXAM:
CHEST - 2 VIEW

[chest pa]
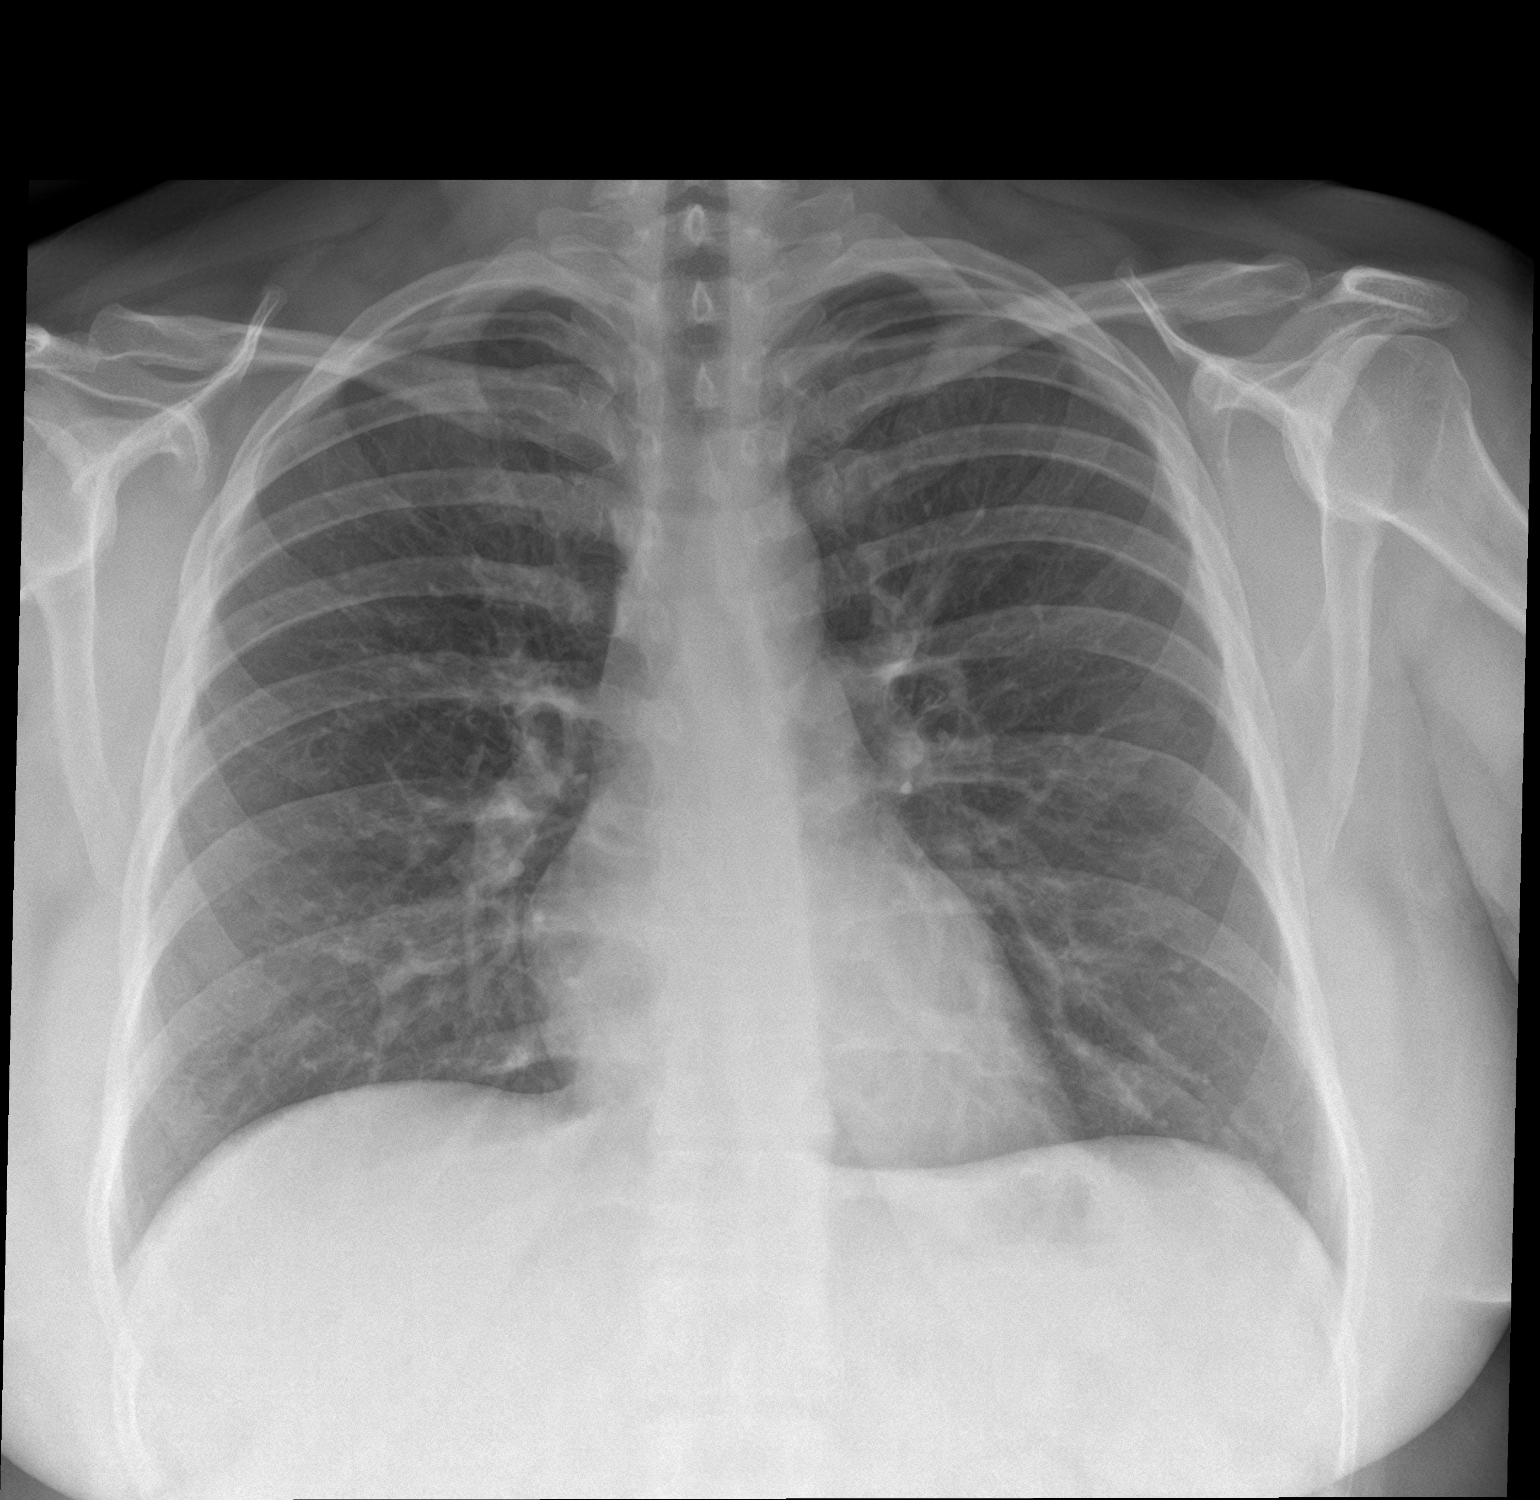

[chest lat]
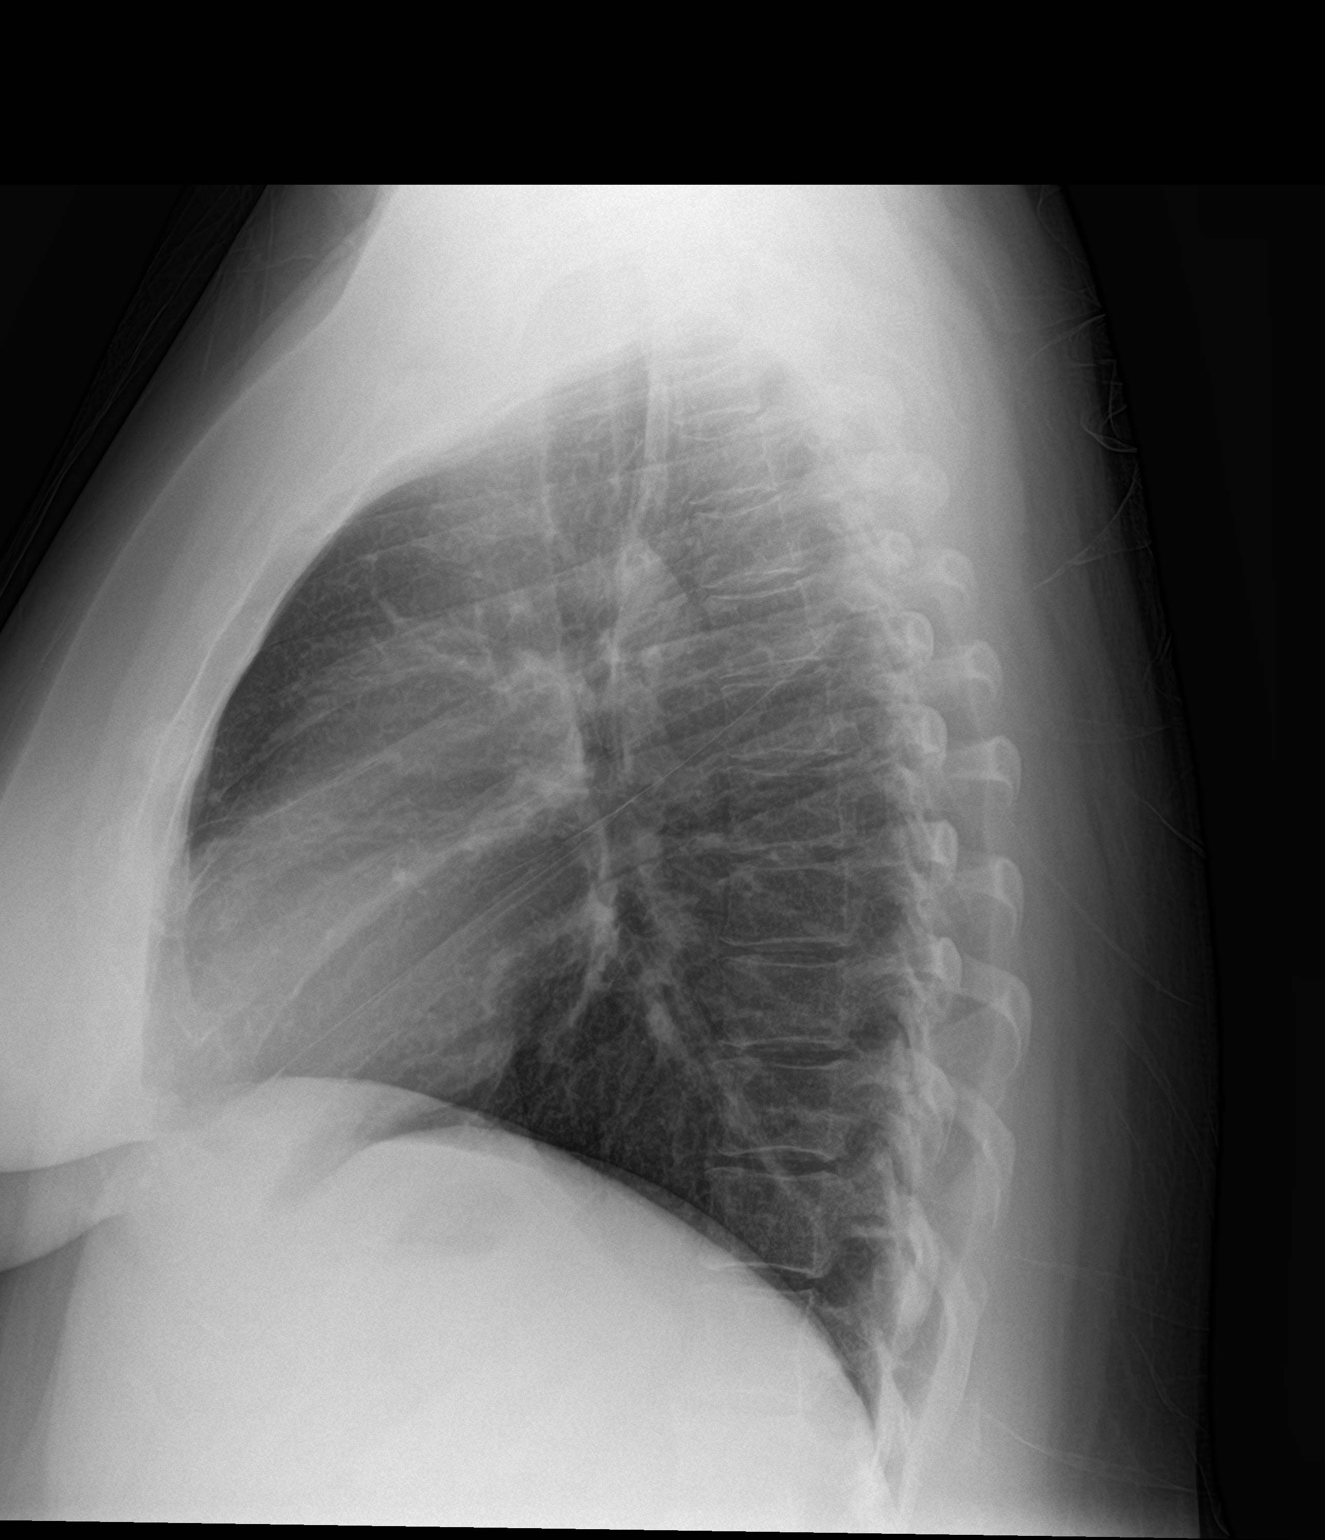

[2 of 2 positions shown; findings below may reference images not displayed]

FINDINGS: The heart size and mediastinal contours are within normal limits.
Both lungs are clear. The visualized skeletal structures are
unremarkable.
IMPRESSION: No active cardiopulmonary disease.

## 2021-02-17 NOTE — ED Triage Notes (Signed)
Pt returns to ED with increased weakness, fatigue. Left AMA on 12/10 with admission for bacteremia, hx of endocarditis, and was supposed to stay for 4 wk course of IV abx. VSS in triage, did take Tylenol PTA. States last drug use was cocaine, this am

## 2021-02-18 ENCOUNTER — Emergency Department
Admission: EM | Admit: 2021-02-18 | Discharge: 2021-02-18 | Disposition: A | Discharge status: Home / Self Care | Payer: BLUE CROSS/BLUE SHIELD | Attending: Emergency Medicine | Admitting: Emergency Medicine

## 2021-02-18 LAB — LACTIC ACID, PLASMA: Lactic Acid, Venous: 1.1 mmol/L (ref 0.5–1.9)

## 2021-02-18 LAB — CBC WITH DIFFERENTIAL/PLATELET
Abs Immature Granulocytes: 0.01 10*3/uL (ref 0.00–0.07)
Basophils Absolute: 0 10*3/uL (ref 0.0–0.1)
Basophils Relative: 0 %
Eosinophils Absolute: 0 10*3/uL (ref 0.0–0.5)
Eosinophils Relative: 0 %
HCT: 29.4 % — ABNORMAL LOW (ref 36.0–46.0)
Hemoglobin: 9.4 g/dL — ABNORMAL LOW (ref 12.0–15.0)
Immature Granulocytes: 0 %
Lymphocytes Relative: 31 %
Lymphs Abs: 1.7 10*3/uL (ref 0.7–4.0)
MCH: 25.3 pg — ABNORMAL LOW (ref 26.0–34.0)
MCHC: 32 g/dL (ref 30.0–36.0)
MCV: 79 fL — ABNORMAL LOW (ref 80.0–100.0)
Monocytes Absolute: 0.3 10*3/uL (ref 0.1–1.0)
Monocytes Relative: 5 %
Neutro Abs: 3.4 10*3/uL (ref 1.7–7.7)
Neutrophils Relative %: 64 %
Platelets: 362 10*3/uL (ref 150–400)
RBC: 3.72 MIL/uL — ABNORMAL LOW (ref 3.87–5.11)
RDW: 15.8 % — ABNORMAL HIGH (ref 11.5–15.5)
WBC: 5.4 10*3/uL (ref 4.0–10.5)
nRBC: 0 % (ref 0.0–0.2)

## 2021-02-18 LAB — COMPREHENSIVE METABOLIC PANEL
ALT: 25 U/L (ref 0–44)
AST: 29 U/L (ref 15–41)
Albumin: 4 g/dL (ref 3.5–5.0)
Alkaline Phosphatase: 116 U/L (ref 38–126)
Anion gap: 7 (ref 5–15)
BUN: 14 mg/dL (ref 6–20)
CO2: 23 mmol/L (ref 22–32)
Calcium: 9.1 mg/dL (ref 8.9–10.3)
Chloride: 101 mmol/L (ref 98–111)
Creatinine, Ser: 0.78 mg/dL (ref 0.44–1.00)
GFR, Estimated: >60 mL/min (ref >60)
Glucose, Bld: 91 mg/dL (ref 70–99)
Potassium: 4.3 mmol/L (ref 3.5–5.1)
Sodium: 131 mmol/L — ABNORMAL LOW (ref 135–145)
Total Bilirubin: 0.5 mg/dL (ref 0.3–1.2)
Total Protein: 8 g/dL (ref 6.5–8.1)

## 2021-02-18 LAB — PROTIME-INR
INR: 1 (ref 0.8–1.2)
Prothrombin Time: 13.4 seconds (ref 11.4–15.2)

## 2021-02-18 NOTE — ED Notes (Signed)
Pt states she cannot wait any longer and has to go to [facility] to her [facility] clinic, pt said it is only open for 1 hour this morning. Pt states she will come back, pt aware she may have to wait even longer if she returns and understands that she will have to "start over".  Pt ambulatory out of lobby doors.

## 2021-02-18 NOTE — ED Notes (Signed)
Lab able to obtain blood except the las blood culture.

## 2021-02-23 LAB — CULTURE, BLOOD (ROUTINE X 2)
Culture: NO GROWTH
Special Requests: ADEQUATE

## 2021-03-01 ENCOUNTER — Other Ambulatory Visit: Payer: Self-pay

## 2021-03-01 ENCOUNTER — Encounter: Payer: Self-pay | Admitting: Emergency Medicine

## 2021-03-01 ENCOUNTER — Inpatient Hospital Stay: Payer: BLUE CROSS/BLUE SHIELD

## 2021-03-01 ENCOUNTER — Inpatient Hospital Stay
Admission: EM | Admit: 2021-03-01 | Discharge: 2021-03-05 | DRG: 872 | Discharge status: Against Medical Advice | Payer: BLUE CROSS/BLUE SHIELD | Attending: Internal Medicine | Admitting: Internal Medicine

## 2021-03-01 DIAGNOSIS — R7881 Bacteremia: Secondary | ICD-10-CM

## 2021-03-01 DIAGNOSIS — Z8679 Personal history of other diseases of the circulatory system: Secondary | ICD-10-CM | POA: Diagnosis not present

## 2021-03-01 DIAGNOSIS — L0390 Cellulitis, unspecified: Secondary | ICD-10-CM | POA: Diagnosis not present

## 2021-03-01 DIAGNOSIS — F17210 Nicotine dependence, cigarettes, uncomplicated: Secondary | ICD-10-CM | POA: Diagnosis present

## 2021-03-01 DIAGNOSIS — D638 Anemia in other chronic diseases classified elsewhere: Secondary | ICD-10-CM | POA: Diagnosis present

## 2021-03-01 DIAGNOSIS — F1990 Other psychoactive substance use, unspecified, uncomplicated: Secondary | ICD-10-CM | POA: Diagnosis present

## 2021-03-01 DIAGNOSIS — L02413 Cutaneous abscess of right upper limb: Secondary | ICD-10-CM | POA: Diagnosis present

## 2021-03-01 DIAGNOSIS — Z9114 Patient's other noncompliance with medication regimen: Secondary | ICD-10-CM | POA: Diagnosis not present

## 2021-03-01 DIAGNOSIS — F1120 Opioid dependence, uncomplicated: Secondary | ICD-10-CM | POA: Diagnosis present

## 2021-03-01 DIAGNOSIS — L03116 Cellulitis of left lower limb: Secondary | ICD-10-CM

## 2021-03-01 DIAGNOSIS — A419 Sepsis, unspecified organism: Secondary | ICD-10-CM | POA: Diagnosis present

## 2021-03-01 DIAGNOSIS — G894 Chronic pain syndrome: Secondary | ICD-10-CM | POA: Diagnosis present

## 2021-03-01 DIAGNOSIS — F1410 Cocaine abuse, uncomplicated: Secondary | ICD-10-CM | POA: Diagnosis present

## 2021-03-01 DIAGNOSIS — B952 Enterococcus as the cause of diseases classified elsewhere: Secondary | ICD-10-CM | POA: Diagnosis present

## 2021-03-01 DIAGNOSIS — Z5329 Procedure and treatment not carried out because of patient's decision for other reasons: Secondary | ICD-10-CM | POA: Diagnosis present

## 2021-03-01 DIAGNOSIS — Z79899 Other long term (current) drug therapy: Secondary | ICD-10-CM

## 2021-03-01 DIAGNOSIS — Z20822 Contact with and (suspected) exposure to covid-19: Secondary | ICD-10-CM | POA: Diagnosis present

## 2021-03-01 DIAGNOSIS — R509 Fever, unspecified: Secondary | ICD-10-CM

## 2021-03-01 DIAGNOSIS — L03113 Cellulitis of right upper limb: Secondary | ICD-10-CM | POA: Diagnosis present

## 2021-03-01 LAB — URINALYSIS, ROUTINE W REFLEX MICROSCOPIC
Bilirubin Urine: NEGATIVE
Glucose, UA: NEGATIVE mg/dL
Hgb urine dipstick: NEGATIVE
Ketones, ur: NEGATIVE mg/dL
Nitrite: NEGATIVE
Protein, ur: NEGATIVE mg/dL
Specific Gravity, Urine: 1.02 (ref 1.005–1.030)
pH: 6 (ref 5.0–8.0)

## 2021-03-01 LAB — COMPREHENSIVE METABOLIC PANEL
ALT: 21 U/L (ref 0–44)
AST: 25 U/L (ref 15–41)
Albumin: 4.1 g/dL (ref 3.5–5.0)
Alkaline Phosphatase: 116 U/L (ref 38–126)
Anion gap: 9 (ref 5–15)
BUN: 11 mg/dL (ref 6–20)
CO2: 26 mmol/L (ref 22–32)
Calcium: 9.2 mg/dL (ref 8.9–10.3)
Chloride: 102 mmol/L (ref 98–111)
Creatinine, Ser: 0.95 mg/dL (ref 0.44–1.00)
GFR, Estimated: >60 mL/min (ref >60)
Glucose, Bld: 81 mg/dL (ref 70–99)
Potassium: 4 mmol/L (ref 3.5–5.1)
Sodium: 137 mmol/L (ref 135–145)
Total Bilirubin: 0.3 mg/dL (ref 0.3–1.2)
Total Protein: 7.9 g/dL (ref 6.5–8.1)

## 2021-03-01 LAB — POC URINE PREG, ED: Preg Test, Ur: NEGATIVE

## 2021-03-01 LAB — CBC WITH DIFFERENTIAL/PLATELET
Abs Immature Granulocytes: 0.01 10*3/uL (ref 0.00–0.07)
Basophils Absolute: 0 10*3/uL (ref 0.0–0.1)
Basophils Relative: 1 %
Eosinophils Absolute: 0.2 10*3/uL (ref 0.0–0.5)
Eosinophils Relative: 4 %
HCT: 33 % — ABNORMAL LOW (ref 36.0–46.0)
Hemoglobin: 10 g/dL — ABNORMAL LOW (ref 12.0–15.0)
Immature Granulocytes: 0 %
Lymphocytes Relative: 28 %
Lymphs Abs: 1.2 10*3/uL (ref 0.7–4.0)
MCH: 24.9 pg — ABNORMAL LOW (ref 26.0–34.0)
MCHC: 30.3 g/dL (ref 30.0–36.0)
MCV: 82.3 fL (ref 80.0–100.0)
Monocytes Absolute: 0.1 10*3/uL (ref 0.1–1.0)
Monocytes Relative: 1 %
Neutro Abs: 2.7 10*3/uL (ref 1.7–7.7)
Neutrophils Relative %: 66 %
Platelets: 325 10*3/uL (ref 150–400)
RBC: 4.01 MIL/uL (ref 3.87–5.11)
RDW: 15.4 % (ref 11.5–15.5)
WBC: 4.2 10*3/uL (ref 4.0–10.5)
nRBC: 0 % (ref 0.0–0.2)

## 2021-03-01 LAB — RESP PANEL BY RT-PCR (FLU A&B, COVID) ARPGX2
Influenza A by PCR: NEGATIVE
Influenza B by PCR: NEGATIVE
SARS Coronavirus 2 by RT PCR: NEGATIVE

## 2021-03-01 LAB — LACTIC ACID, PLASMA
Lactic Acid, Venous: 1.4 mmol/L (ref 0.5–1.9)
Lactic Acid, Venous: 1.8 mmol/L (ref 0.5–1.9)

## 2021-03-01 IMAGING — CR DG CHEST 1V PORT
1 series · 1 of 1 positions shown · non-contrast
Comparison: [DATE]

CLINICAL DATA: Palpitations, recent hospitalization for sepsis

EXAM:
PORTABLE CHEST 1 VIEW

[chest ap]
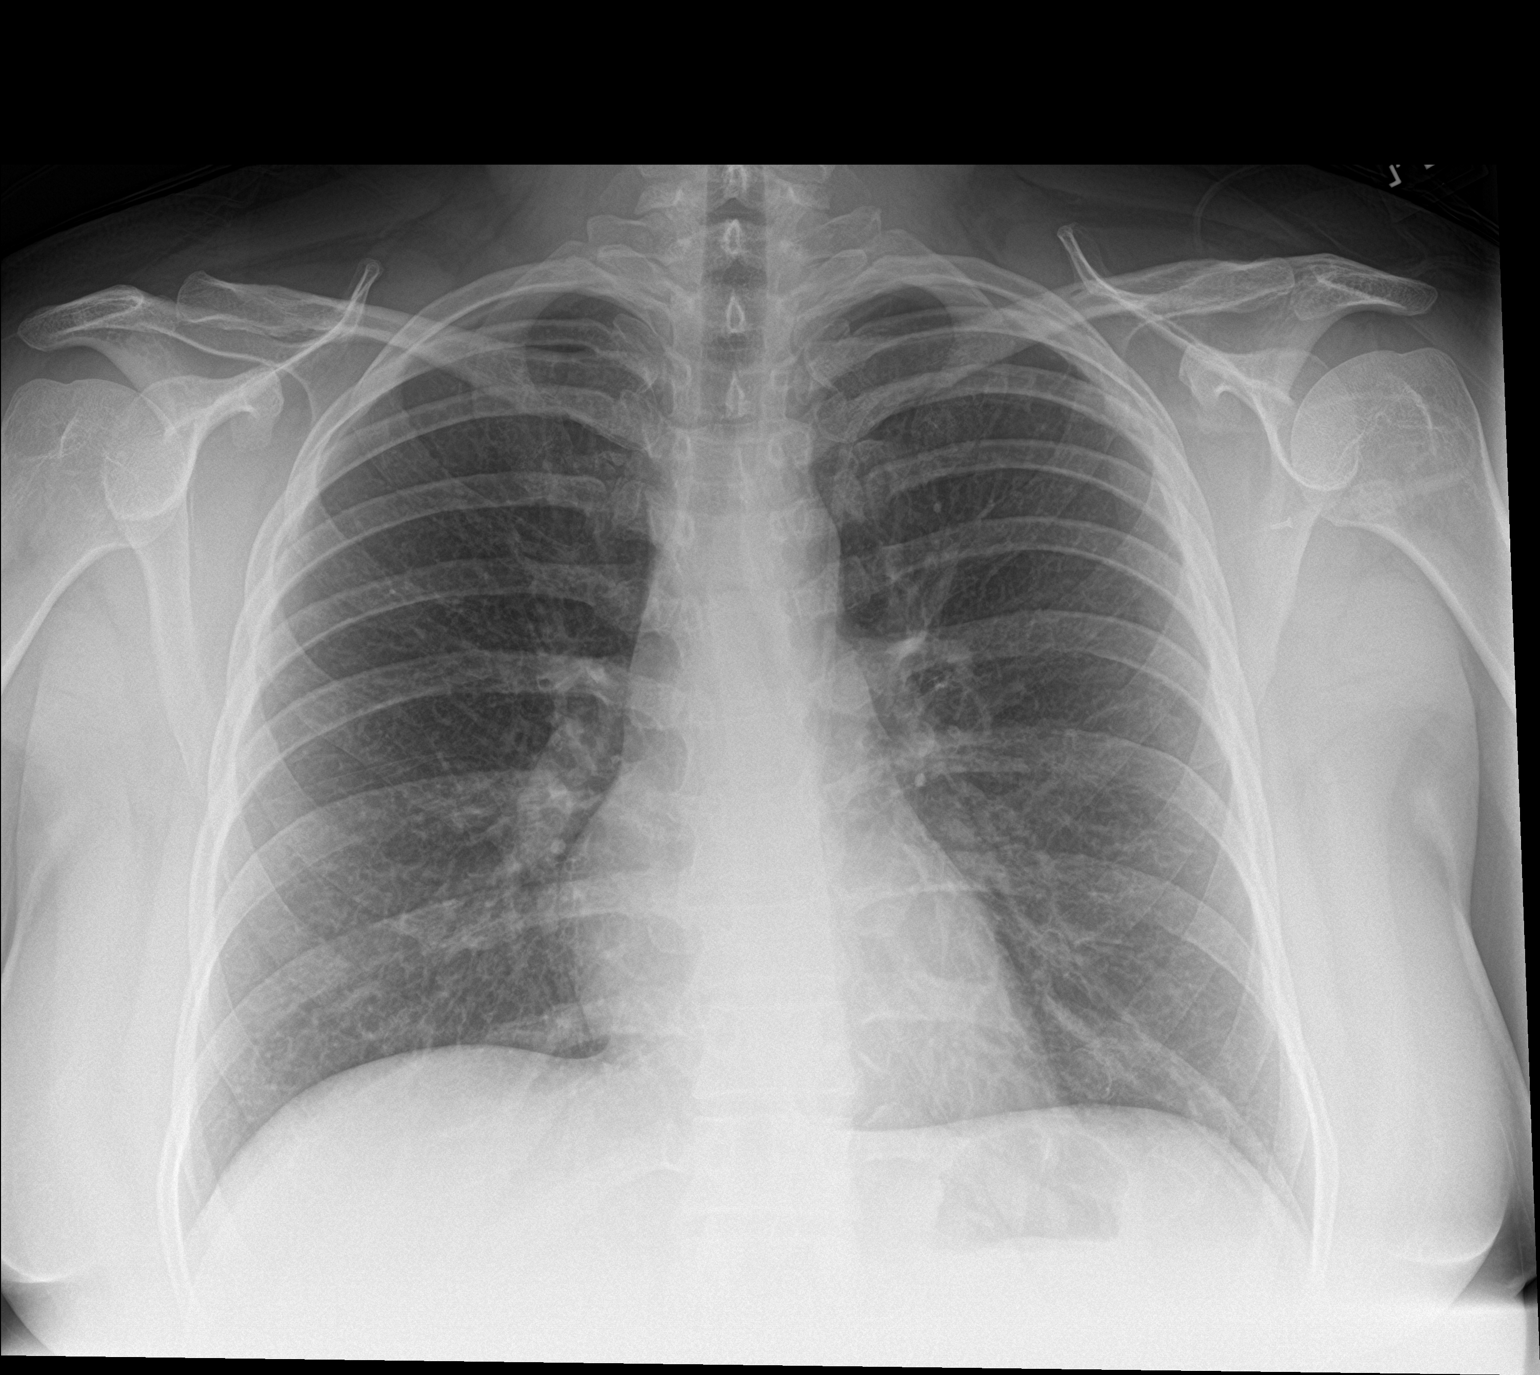

[1 of 1 positions shown; findings below may reference images not displayed]

FINDINGS: Cardiac and mediastinal contours are within normal limits. No focal
pulmonary opacity. No pleural effusion or pneumothorax. No acute
osseous abnormality.
IMPRESSION: No acute cardiopulmonary process.

## 2021-03-01 MED ORDER — ACETAMINOPHEN 325 MG PO TABS
650.0000 mg | ORAL_TABLET | Freq: Four times a day (QID) | ORAL | Status: DC | PRN
  Administered 2021-03-04 (×2): 650 mg via ORAL
  Filled 2021-03-01 (×2): qty 2

## 2021-03-01 MED ORDER — QUETIAPINE FUMARATE 25 MG PO TABS
100.0000 mg | ORAL_TABLET | Freq: Every day | ORAL | Status: DC
  Administered 2021-03-02 – 2021-03-04 (×4): 100 mg via ORAL
  Filled 2021-03-01 (×4): qty 4

## 2021-03-01 MED ORDER — SODIUM CHLORIDE 0.9 % IV SOLN
2.0000 g | Freq: Four times a day (QID) | INTRAVENOUS | Status: DC
  Administered 2021-03-02 (×2): 2 g via INTRAVENOUS
  Filled 2021-03-01 (×5): qty 2000

## 2021-03-01 MED ORDER — ACETAMINOPHEN 650 MG RE SUPP: 650.0000 mg | Freq: Four times a day (QID) | RECTAL | Status: DC | PRN

## 2021-03-01 MED ORDER — VANCOMYCIN HCL 1500 MG/300ML IV SOLN
1500.0000 mg | INTRAVENOUS | Status: DC
  Filled 2021-03-01: qty 300

## 2021-03-01 MED ORDER — SODIUM CHLORIDE 0.9 % IV SOLN: INTRAVENOUS | Status: DC

## 2021-03-01 MED ORDER — ENOXAPARIN SODIUM 60 MG/0.6ML IJ SOSY
0.5000 mg/kg | PREFILLED_SYRINGE | INTRAMUSCULAR | Status: DC
  Administered 2021-03-02 (×2): 45 mg via SUBCUTANEOUS
  Filled 2021-03-01 (×4): qty 0.6

## 2021-03-01 MED ORDER — MAGNESIUM HYDROXIDE 400 MG/5ML PO SUSP
30.0000 mL | Freq: Every day | ORAL | Status: DC | PRN
  Filled 2021-03-01: qty 30

## 2021-03-01 MED ORDER — SODIUM CHLORIDE 0.9 % IV SOLN
1.0000 g | Freq: Once | INTRAVENOUS | Status: AC
  Administered 2021-03-01: 19:00:00 1 g via INTRAVENOUS
  Filled 2021-03-01: qty 10

## 2021-03-01 MED ORDER — ACETAMINOPHEN 325 MG PO TABS
650.0000 mg | ORAL_TABLET | Freq: Once | ORAL | Status: AC
  Administered 2021-03-01: 19:00:00 650 mg via ORAL
  Filled 2021-03-01: qty 2

## 2021-03-01 MED ORDER — SODIUM CHLORIDE 0.9 % IV BOLUS
1000.0000 mL | Freq: Once | INTRAVENOUS | Status: AC
  Administered 2021-03-01: 19:00:00 1000 mL via INTRAVENOUS

## 2021-03-01 MED ORDER — ONDANSETRON HCL 4 MG PO TABS: 4.0000 mg | ORAL_TABLET | Freq: Four times a day (QID) | ORAL | Status: DC | PRN

## 2021-03-01 MED ORDER — VANCOMYCIN HCL 2000 MG/400ML IV SOLN
2000.0000 mg | Freq: Once | INTRAVENOUS | Status: AC
  Administered 2021-03-01: 22:00:00 2000 mg via INTRAVENOUS
  Filled 2021-03-01: qty 400

## 2021-03-01 MED ORDER — METHADONE HCL 10 MG/ML PO CONC
10.0000 mg | Freq: Every day | ORAL | Status: DC
  Administered 2021-03-02: 09:00:00 10 mg via ORAL
  Filled 2021-03-01: qty 1

## 2021-03-01 MED ORDER — TRAZODONE HCL 50 MG PO TABS
25.0000 mg | ORAL_TABLET | Freq: Every evening | ORAL | Status: DC | PRN
  Administered 2021-03-02: 22:00:00 25 mg via ORAL
  Filled 2021-03-01: qty 1

## 2021-03-01 MED ORDER — ONDANSETRON HCL 4 MG/2ML IJ SOLN: 4.0000 mg | Freq: Four times a day (QID) | INTRAMUSCULAR | Status: DC | PRN

## 2021-03-01 MED ORDER — SODIUM CHLORIDE 0.9 % IV SOLN
2.0000 g | Freq: Three times a day (TID) | INTRAVENOUS | Status: DC
  Administered 2021-03-02 – 2021-03-05 (×10): 2 g via INTRAVENOUS
  Filled 2021-03-01 (×14): qty 2

## 2021-03-01 MED ORDER — SODIUM CHLORIDE 0.9 % IV SOLN
1.0000 g | Freq: Four times a day (QID) | INTRAVENOUS | Status: DC
  Administered 2021-03-01: 20:00:00 1 g via INTRAVENOUS
  Filled 2021-03-01 (×2): qty 1000
  Filled 2021-03-01: qty 1
  Filled 2021-03-01: qty 1000

## 2021-03-01 MED ORDER — PRAZOSIN HCL 2 MG PO CAPS
2.0000 mg | ORAL_CAPSULE | Freq: Every day | ORAL | Status: DC
  Administered 2021-03-03 – 2021-03-04 (×2): 2 mg via ORAL
  Filled 2021-03-01 (×6): qty 1

## 2021-03-01 NOTE — ED Notes (Signed)
Pt in bathroom

## 2021-03-01 NOTE — ED Notes (Signed)
Pt. Returned from bathroom.

## 2021-03-01 NOTE — ED Notes (Signed)
Pt. Ambulated to bathroom indep.

## 2021-03-01 NOTE — ED Provider Notes (Signed)
[facility] Emergency Department Provider Note   ____________________________________________   Event Date/Time   First MD Initiated Contact with Patient 03/01/21 1710     (approximate)  I have reviewed the triage vital signs and the nursing notes.  HISTORY  Chief Complaint Abnormal Lab HPI Karen Clarke is a 35 y.o. female who presents after being told that she had positive blood cultures and leaving the hospital AMA on 02/10/2021.  Since that time she states that she has continued to use IV cocaine as well as continue to have subjective fevers and chills with associated diaphoresis.  Patient also has multiple areas of cellulitis from IV injection sites including on her right forearm, left anterior lower extremity overlying the shin, and left arm in the antecubital fossa.  Patient denies any chest pain/pressure or shortness of breath/dyspnea on exertion but does endorse palpitations and constant diaphoresis.  Patient does endorse history of endocarditis.  Patient denies being on any oral antibiotics in the outpatient setting.           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 Authorizing Provider   methadone (DOLOPHINE) 10 MG/ML solution Take by mouth.   Yes [provider]  prazosin (MINIPRESS) 2 MG capsule Take 2 mg by mouth at bedtime. 12/13/20  Yes [provider]  QUEtiapine (SEROQUEL) 100 MG tablet Take 100 mg by mouth at bedtime. 12/30/20  Yes [provider]  citalopram (CELEXA) 20 MG tablet Take 20 mg by mouth at bedtime. Patient not taking: Reported on 02/06/2021 10/03/20   [provider]    Allergies Capsaicin  History reviewed. No pertinent family history.  Social History Social History   Tobacco Use   Smoking status: Every Day    Types: Cigarettes   Smokeless tobacco: Never  Substance Use Topics   Alcohol use: Never   Drug use: Never    Review of Systems Constitutional: Endorses subjective fever/chills and diaphoresis Eyes: No visual changes. ENT: No sore throat. Cardiovascular: Denies chest pain.  Endorses palpitations Respiratory: Denies shortness of breath. Gastrointestinal: No abdominal pain.  No nausea, no vomiting.  No diarrhea. Genitourinary: Negative for dysuria. Musculoskeletal: Negative for acute arthralgias Skin: Negative for rash. Neurological: Negative for headaches, weakness/numbness/paresthesias in any extremity Psychiatric: Negative for suicidal ideation/homicidal ideation   ____________________________________________   PHYSICAL EXAM:  VITAL SIGNS: ED Triage Vitals  Enc Vitals Group     BP 03/01/21 1331 (!) 117/97     Pulse Rate 03/01/21 1331 (!) 104     Resp 03/01/21 1331 17     Temp 03/01/21 1331 98.4 F (36.9 C)     Temp Source 03/01/21 1331 Oral     SpO2 03/01/21 1331 100 %  Weight 03/01/21 1332 200 lb (90.7 kg)     Height 03/01/21 1332 5\' 3"  (1.6 m)     Head Circumference --      Peak Flow --      Pain Score 03/01/21 1332 0     Pain Loc --      Pain Edu? --      Excl. in GC? --    Constitutional: Alert and oriented. Well appearing and in no acute distress. Eyes: Conjunctivae  are normal. PERRL. Head: Atraumatic. Nose: No congestion/rhinnorhea. Mouth/Throat: Mucous membranes are moist. Neck: No stridor Cardiovascular: Grossly normal heart sounds.  Good peripheral circulation. Respiratory: Normal respiratory effort.  No retractions. Gastrointestinal: Soft and nontender. No distention. Musculoskeletal: No obvious deformities Neurologic:  Normal speech and language. No gross focal neurologic deficits are appreciated. Skin:  Skin is diaphoretic and warm. No rash noted. Psychiatric: Mood and affect are normal. Speech and behavior are normal.  ____________________________________________   LABS (all labs ordered are listed, but only abnormal results are displayed)  Labs Reviewed  CBC WITH DIFFERENTIAL/PLATELET - Abnormal; Notable for the following components:      Result Value   Hemoglobin 10.0 (*)    HCT 33.0 (*)    MCH 24.9 (*)    All other components within normal limits  URINALYSIS, ROUTINE W REFLEX MICROSCOPIC - Abnormal; Notable for the following components:   Color, Urine YELLOW (*)    APPearance HAZY (*)    Leukocytes,Ua TRACE (*)    Bacteria, UA RARE (*)    All other components within normal limits  CULTURE, BLOOD (ROUTINE X 2)  CULTURE, BLOOD (ROUTINE X 2)  RESP PANEL BY RT-PCR (FLU A&B, COVID) ARPGX2  LACTIC ACID, PLASMA  LACTIC ACID, PLASMA  COMPREHENSIVE METABOLIC PANEL  BASIC METABOLIC PANEL  CBC  POC URINE PREG, ED   ____________________________________________  EKG  ED ECG REPORT I, 03/03/21, the attending physician, personally viewed and interpreted this ECG.  Date: 03/01/2021 EKG Time: 2136 Rate: 85 Rhythm: normal sinus rhythm QRS Axis: normal Intervals: normal ST/T Wave abnormalities: normal Narrative Interpretation: no evidence of acute ischemia  ____________________________________________  RADIOLOGY  ED MD interpretation: One-view portable chest x-ray shows no evidence of acute abnormalities including no  pneumonia, pneumothorax, or widened mediastinum  Official radiology report(s): DG Chest Port 1 View  Result Date: 03/01/2021 CLINICAL DATA:  Palpitations, recent hospitalization for sepsis EXAM: PORTABLE CHEST 1 VIEW COMPARISON:  02/17/2021 FINDINGS: Cardiac and mediastinal contours are within normal limits. No focal pulmonary opacity. No pleural effusion or pneumothorax. No acute osseous abnormality. IMPRESSION: No acute cardiopulmonary process. Electronically Signed   By: 02/19/2021 M.D.   On: 03/01/2021 19:43    ____________________________________________   PROCEDURES  Procedure(s) performed (including Critical Care):  Procedures   ____________________________________________   INITIAL IMPRESSION / ASSESSMENT AND PLAN / ED COURSE  As part of my medical decision making, I reviewed the following data within the electronic medical record, if available:  Nursing notes reviewed and incorporated, Labs reviewed, EKG interpreted, Old chart reviewed, Radiograph reviewed and Notes from prior ED visits reviewed and incorporated        The Pt presents with subjective fever/chills, positive blood cultures, and persistent palpitations/diaphoresis highly concerning for sepsis (suspected epidermis/cellulitic source due to IVDA). At this time, the Pt is satting well on room air, normotensive, and appears HDS.  Will start empiric antibiotics and fluids.  Due to persistent diaphoresis, will administer fluids gradually with frequent reassessment. Have low suspicion for  a GI, skin/soft tissue, or CNS source at this time, but will reconsider if initial workup is unremarkable.  - CBC, BMP, LFTs - VBG - UA - BCx x2, Lactate - EKG - CXR - Empiric Abx: Ampicillin and ceftriaxone - Fluids: 1 L NS Given patient's positive blood cultures, leaving AMA from the hospital without any outpatient antibiotics, and persistent diaphoresis/tachycardia, she will require admission for repeat echo as well as  continued IV antibiotics Dispo: Admit to medicine      ____________________________________________   FINAL CLINICAL IMPRESSION(S) / ED DIAGNOSES  Final diagnoses:  Bacteremia  Fever and chills  Right forearm cellulitis  Cellulitis of left lower extremity     ED Discharge Orders     None        Note:  This document was prepared using Dragon voice recognition software and may include unintentional dictation errors.    Merwyn Katos, MD 03/01/21 2217

## 2021-03-01 NOTE — Consult Note (Signed)
Pharmacy Antibiotic Note  Karen Clarke is a 35 y.o. female admitted on 03/01/2021 with cellulitis.  Pharmacy has been consulted for Vancomycin and Cefepime dosing. Patient also has Ampicillin ordered due to history of bacterial endocarditis.  Plan: 1) Will order loading dose of Vancomycin 2g IV x1. Maintenance: Vancomycin 1500 mg IV Q 24 hrs. Goal AUC 400-550. Expected AUC: 531.5 SCr used: 0.95  2) Cefepime 2g Q 8 hrs   Height: 5\' 3"  (160 cm) Weight: 90.7 kg (200 lb) IBW/kg (Calculated) : 52.4  Temp (24hrs), Avg:98.4 F (36.9 C), Min:98.4 F (36.9 C), Max:98.4 F (36.9 C)  Recent Labs  Lab 03/01/21 1335 03/01/21 1806  WBC 4.2  --   CREATININE 0.95  --   LATICACIDVEN 1.4 1.8    Estimated Creatinine Clearance: 90 mL/min (by C-G formula based on SCr of 0.95 mg/dL).    Allergies  Allergen Reactions   Capsaicin Hives and Itching    Antimicrobials this admission: Ampicillin 12/29 >>  Vancomycin 12/29 >>  Cefepime 12/29 >> Rocephin 12/29 x1   Microbiology results: 12/29 BCx: pending   Thank you for allowing pharmacy to be a part of this patients care.  Lisandro Meggett A [place] 03/01/2021 10:06 PM

## 2021-03-01 NOTE — ED Triage Notes (Signed)
Pt comes into the ED via POV c/o abnormal labs.  Pt was admitted to the hospital for sepsis a couple days ago and left AMA.  Hospital called her back to return due to positive blood cultures.  Pt still having sweats, chills, and exhaustion.  Pt admits that the initial infection was from IV drug use.

## 2021-03-02 DIAGNOSIS — A419 Sepsis, unspecified organism: Secondary | ICD-10-CM | POA: Diagnosis not present

## 2021-03-02 DIAGNOSIS — L0390 Cellulitis, unspecified: Secondary | ICD-10-CM

## 2021-03-02 LAB — CBC
HCT: 27.6 % — ABNORMAL LOW (ref 36.0–46.0)
Hemoglobin: 8.6 g/dL — ABNORMAL LOW (ref 12.0–15.0)
MCH: 24.7 pg — ABNORMAL LOW (ref 26.0–34.0)
MCHC: 31.2 g/dL (ref 30.0–36.0)
MCV: 79.3 fL — ABNORMAL LOW (ref 80.0–100.0)
Platelets: 250 10*3/uL (ref 150–400)
RBC: 3.48 MIL/uL — ABNORMAL LOW (ref 3.87–5.11)
RDW: 15.5 % (ref 11.5–15.5)
WBC: 4.7 10*3/uL (ref 4.0–10.5)
nRBC: 0 % (ref 0.0–0.2)

## 2021-03-02 LAB — BASIC METABOLIC PANEL
Anion gap: 8 (ref 5–15)
BUN: 9 mg/dL (ref 6–20)
CO2: 22 mmol/L (ref 22–32)
Calcium: 8.1 mg/dL — ABNORMAL LOW (ref 8.9–10.3)
Chloride: 109 mmol/L (ref 98–111)
Creatinine, Ser: 0.68 mg/dL (ref 0.44–1.00)
GFR, Estimated: >60 mL/min (ref >60)
Glucose, Bld: 80 mg/dL (ref 70–99)
Potassium: 4.2 mmol/L (ref 3.5–5.1)
Sodium: 139 mmol/L (ref 135–145)

## 2021-03-02 MED ORDER — METHADONE HCL 10 MG/ML PO CONC
100.0000 mg | Freq: Every day | ORAL | Status: DC
  Administered 2021-03-03 – 2021-03-05 (×3): 100 mg via ORAL
  Filled 2021-03-02 (×3): qty 10

## 2021-03-02 MED ORDER — MORPHINE SULFATE (PF) 2 MG/ML IV SOLN
1.0000 mg | Freq: Once | INTRAVENOUS | Status: AC
  Administered 2021-03-02: 22:00:00 1 mg via INTRAVENOUS
  Filled 2021-03-02: qty 1

## 2021-03-02 MED ORDER — VANCOMYCIN HCL 750 MG/150ML IV SOLN
750.0000 mg | Freq: Two times a day (BID) | INTRAVENOUS | Status: DC
  Administered 2021-03-02: 13:00:00 1500 mg via INTRAVENOUS
  Administered 2021-03-03 – 2021-03-05 (×6): 750 mg via INTRAVENOUS
  Filled 2021-03-02 (×10): qty 150

## 2021-03-02 MED ORDER — SODIUM CHLORIDE 0.9% FLUSH: 10.0000 mL | INTRAVENOUS | Status: DC | PRN

## 2021-03-02 MED ORDER — METHADONE HCL 10 MG/ML PO CONC
90.0000 mg | Freq: Once | ORAL | Status: AC
  Administered 2021-03-02: 13:00:00 90 mg via ORAL
  Filled 2021-03-02: qty 9

## 2021-03-02 MED ORDER — SODIUM CHLORIDE 0.9% FLUSH
10.0000 mL | Freq: Two times a day (BID) | INTRAVENOUS | Status: DC
  Administered 2021-03-02 – 2021-03-05 (×4): 10 mL

## 2021-03-02 NOTE — Consult Note (Signed)
Pharmacy Antibiotic Note  Karen Clarke is a 35 y.o. female admitted on 03/01/2021 with cellulitis.  Pharmacy has been consulted for Vancomycin and Cefepime dosing. Patient also has Ampicillin ordered due to history of bacterial endocarditis. - history of IV drug abuse and prior endocarditis with recent admission 02/07/21 w/ Bcx: Enterococcus faecalis -current Bcx: NG <24 hrs,  f/u  Plan: Scr improved 0.95 >>0.68  1) Will adjust Vancomycin from 1500 mg IV Q 24 hrs to 750 q12h. Goal AUC 400-550. Expected AUC: 452 SCr used: 0.80 Cmin: 12.7  Vd= 0.5   BMI > 30  2) continue Cefepime 2g Q 8 hrs   Height: 5\' 3"  (160 cm) Weight: 90.7 kg (200 lb) IBW/kg (Calculated) : 52.4  Temp (24hrs), Avg:98.4 F (36.9 C), Min:98.4 F (36.9 C), Max:98.4 F (36.9 C)  Recent Labs  Lab 03/01/21 1335 03/01/21 1806 03/02/21 0858  WBC 4.2  --  4.7  CREATININE 0.95  --  0.68  LATICACIDVEN 1.4 1.8  --      Estimated Creatinine Clearance: 106.9 mL/min (by C-G formula based on SCr of 0.68 mg/dL).    Allergies  Allergen Reactions   Capsaicin Hives and Itching    Antimicrobials this admission: Ampicillin 12/29 >>  Vancomycin 12/29 >>  Cefepime 12/29 >> Rocephin 12/29 x1   Microbiology results: 12/29 BCx: NG<24hrs   Thank you for allowing pharmacy to be a part of this patients care.  Karen Clarke A 03/02/2021 10:38 AM

## 2021-03-02 NOTE — TOC Initial Note (Signed)
Transition of Care [facility]) - Initial/Assessment Note    Patient Details  Name: Karen Clarke MRN: [id number] Date of Birth: Feb 11, 1986  Transition of Care [facility]) CM/SW Contact:    Shelbie Hutching, RN Phone Number: 03/02/2021, 1:49 PM  Clinical Narrative:                 Patient admitted to the hospital for Bacteremia, she has cellulitis in several areas on arms and legs from IVDU (cocaine).  TOC consult for substance abuse resources.  RNCM met with patient at the bedside in the emergency room.  Patient reports that she is from home with her boyfriend and his mother.  They have been together for about 5 years and she reports he is supportive.  She goes to [facility] in [place] for methadone everyday, boyfriend drives her, she reports they also provide counseling, when asked how things were going she says, it's been hard.   Patient does not have a PCP, provided her with a list of [place] in the [employer] [facility] and low cost healthcare in [place].    TOC will cont to follow for any additional discharge needs.   Expected Discharge Plan: Home/Self Care Barriers to Discharge: Continued Medical Work up   Patient Goals and CMS Choice        Expected Discharge Plan and Services Expected Discharge Plan: Home/Self Care   Discharge Planning Services: CM Consult   Living arrangements for the past 2 months: Apartment                 DME Arranged: N/A DME Agency: NA       HH Arranged: NA HH Agency: NA        Prior Living Arrangements/Services Living arrangements for the past 2 months: Apartment Lives with:: Significant Other Patient language and need for interpreter reviewed:: Yes Do you feel safe going back to the place where you live?: Yes      Need for Family Participation in Patient Care: Yes (Comment) (sepsis, drug abuse) Care giver support system in place?: Yes (comment) (significant other)   Criminal Activity/Legal Involvement Pertinent to  Current Situation/Hospitalization: No - Comment as needed  Activities of Daily Living      Permission Sought/Granted Permission sought to share information with : Case Manager, Family Supports Permission granted to share information with : Yes, Verbal Permission Granted  Share Information with NAME: Casimer Bilis     Permission granted to share info w Relationship: significant other  Permission granted to share info w Contact Information: [phone]  Emotional Assessment Appearance:: Appears stated age Attitude/Demeanor/Rapport: Engaged Affect (typically observed): Flat, Accepting Orientation: : Oriented to Self, Oriented to Place, Oriented to  Time, Oriented to Situation Alcohol / Substance Use: Illicit Drugs Psych Involvement: No (comment)  Admission diagnosis:  Sepsis due to cellulitis ([place]) [L03.90, A41.9] Patient Active Problem List   Diagnosis Date Noted   Sepsis due to cellulitis ([place]) 03/01/2021   Bacteremia 02/08/2021   Cellulitis of right lower extremity 02/07/2021   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   Polysubstance abuse ([place]) 06/21/2018   Pseudotumor cerebri 06/21/2018   Tobacco abuse 06/21/2018   IVDU (intravenous drug user) 06/16/2018   Positive hepatitis C antibody test 03/27/2016   PCP:  Pcp, No Pharmacy:   [facility] [address], [place] [address] Phone: [phone] Fax: [phone]     Social Determinants of Health (SDOH) Interventions    Readmission Risk  Interventions No flowsheet data found.

## 2021-03-02 NOTE — Progress Notes (Signed)

## 2021-03-02 NOTE — H&P (Signed)
History and Physical    Karen Clarke ZOX:[id number] DOB: [birth year] DOA: 03/01/2021  PCP: Pcp, No  Patient coming from: Home.  Chief Complaint: To complete therapy.  HPI: Karen Clarke is a 35 y.o. female with history of IV drug abuse and prior endocarditis who was recently admitted in February 07, 2021 for multiple skin lesions and patient also admitting to having used IV drugs blood cultures came back as Enterococcus faecalis with TEE showing no valvular involvement patient was placed on ampicillin and ceftriaxone.  Patient eventually signed out AMA after 3 days.  Patient comes back stating that she again has injected cocaine onto her arms 2 days ago and has been having subjective feeling of fever chills and wanted to complete her medication therapy.  ED Course: In the ER patient was afebrile and on exam has multiple skin wounds on upper and lower extremity.  Labs show WBC of 4.2 lactic acid was normal.  Hemoglobin 10 metabolic panel unremarkable.  COVID test negative.  EKG shows normal sinus rhythm.  Blood pressures obtained and started on empiric antibiotics.  Review of Systems: As per HPI, rest all negative.   Past Medical History:  Diagnosis Date   IgA deficiency (HCC)    Lupus (HCC)     Past Surgical History:  Procedure Laterality Date   TEE WITHOUT CARDIOVERSION N/A 02/09/2021   Procedure: TRANSESOPHAGEAL ECHOCARDIOGRAM (TEE);  Surgeon: Antonieta Iba, MD;  Location: [facility] ORS;  Service: Cardiovascular;  Laterality: N/A;     reports that she has been smoking cigarettes. She has never used smokeless tobacco. She reports that she does not drink alcohol and does not use drugs.  Allergies  Allergen Reactions   Capsaicin Hives and Itching    History reviewed. No pertinent family history.  Prior to Admission medications   Medication Sig Start Date End Date Taking? Authorizing Provider  methadone (DOLOPHINE) 10 MG/ML solution Take by mouth.   Yes [provider]   prazosin (MINIPRESS) 2 MG capsule Take 2 mg by mouth at bedtime. 12/13/20  Yes [provider]  QUEtiapine (SEROQUEL) 100 MG tablet Take 100 mg by mouth at bedtime. 12/30/20  Yes [provider]  citalopram (CELEXA) 20 MG tablet Take 20 mg by mouth at bedtime. Patient not taking: Reported on 02/06/2021 10/03/20   [provider]    Physical Exam: Constitutional: Moderately built and nourished. Vitals:   03/01/21 1331 03/01/21 1332 03/02/21 0100 03/02/21 0250  BP: (!) 117/97  (!) 111/54 (!) 114/59  Pulse: (!) 104   78  Resp: 17   20  Temp: 98.4 F (36.9 C)     TempSrc: Oral     SpO2: 100%   99%  Weight:  90.7 kg    Height:  5\' 3"  (1.6 m)     Eyes: Anicteric no pallor. ENMT: No discharge from the ears eyes nose wall. Neck: No mass felt.  No neck rigidity. Respiratory: No rhonchi or crepitations. Cardiovascular: S1-S2 heard. Abdomen: Soft nontender bowel present. Musculoskeletal: Multiple skin lesions on the upper and lower extremity with an abscess on the right forearm. Skin: Multiple skin lesions with possible abscess of the right forearm. Neurologic: Alert awake oriented to time place and person.  Moves all extremities. Psychiatric: Appears normal per normal affect.   Labs on Admission: I have personally reviewed following labs and imaging studies  CBC: Recent Labs  Lab 03/01/21 1335  WBC 4.2  NEUTROABS 2.7  HGB 10.0*  HCT 33.0*  MCV 82.3  PLT 325   Basic Metabolic Panel: Recent Labs  Lab 03/01/21 1335  NA 137  K 4.0  CL 102  CO2 26  GLUCOSE 81  BUN 11  CREATININE 0.95  CALCIUM 9.2   GFR: Estimated Creatinine Clearance: 90 mL/min (by C-G formula based on SCr of 0.95 mg/dL). Liver Function Tests: Recent Labs  Lab 03/01/21 1335  AST 25  ALT 21  ALKPHOS 116  BILITOT 0.3  PROT 7.9  ALBUMIN 4.1   No results for input(s): LIPASE, AMYLASE in the last 168 hours. No results for input(s): AMMONIA in the last 168  hours. Coagulation Profile: No results for input(s): INR, PROTIME in the last 168 hours. Cardiac Enzymes: No results for input(s): CKTOTAL, CKMB, CKMBINDEX, TROPONINI in the last 168 hours. BNP (last 3 results) No results for input(s): PROBNP in the last 8760 hours. HbA1C: No results for input(s): HGBA1C in the last 72 hours. CBG: No results for input(s): GLUCAP in the last 168 hours. Lipid Profile: No results for input(s): CHOL, HDL, LDLCALC, TRIG, CHOLHDL, LDLDIRECT in the last 72 hours. Thyroid Function Tests: No results for input(s): TSH, T4TOTAL, FREET4, T3FREE, THYROIDAB in the last 72 hours. Anemia Panel: No results for input(s): VITAMINB12, FOLATE, FERRITIN, TIBC, IRON, RETICCTPCT in the last 72 hours. Urine analysis:    Component Value Date/Time   COLORURINE YELLOW (A) 03/01/2021 1806   APPEARANCEUR HAZY (A) 03/01/2021 1806   LABSPEC 1.020 03/01/2021 1806   PHURINE 6.0 03/01/2021 1806   GLUCOSEU NEGATIVE 03/01/2021 1806   HGBUR NEGATIVE 03/01/2021 1806   BILIRUBINUR NEGATIVE 03/01/2021 1806   KETONESUR NEGATIVE 03/01/2021 1806   PROTEINUR NEGATIVE 03/01/2021 1806   UROBILINOGEN 0.2 02/21/2009 1848   NITRITE NEGATIVE 03/01/2021 1806   LEUKOCYTESUR TRACE (A) 03/01/2021 1806   Sepsis Labs: @LABRCNTIP (procalcitonin:4,lacticidven:4) ) Recent Results (from the past 240 hour(s))  Resp Panel by RT-PCR (Flu A&B, Covid) Nasopharyngeal Swab     Status: None   Collection Time: 03/01/21 10:40 PM   Specimen: Nasopharyngeal Swab; Nasopharyngeal(NP) swabs in vial transport medium  Result Value Ref Range Status   SARS Coronavirus 2 by RT PCR NEGATIVE NEGATIVE Final    Comment: (NOTE) SARS-CoV-2 target nucleic acids are NOT DETECTED.  The SARS-CoV-2 RNA is generally detectable in upper respiratory specimens during the acute phase of infection. The lowest concentration of SARS-CoV-2 viral copies this assay can detect is 138 copies/mL. A negative result does not preclude  SARS-Cov-2 infection and should not be used as the sole basis for treatment or other patient management decisions. A negative result may occur with  improper specimen collection/handling, submission of specimen other than nasopharyngeal swab, presence of viral mutation(s) within the areas targeted by this assay, and inadequate number of viral copies(<138 copies/mL). A negative result must be combined with clinical observations, patient history, and epidemiological information. The expected result is Negative.  Fact Sheet for Patients:  03/03/21  Fact Sheet for Healthcare Providers:  BloggerCourse.com  This test is no t yet approved or cleared by the [employer] FDA and  has been authorized for detection and/or diagnosis of SARS-CoV-2 by FDA under an Emergency Use Authorization (EUA). This EUA will remain  in effect (meaning this test can be used) for the duration of the COVID-19 declaration under Section 564(b)(1) of the Act, 21 U.S.C.section 360bbb-3(b)(1), unless the authorization is terminated  or revoked sooner.       Influenza A by PCR NEGATIVE NEGATIVE Final   Influenza B by PCR NEGATIVE NEGATIVE Final  Comment: (NOTE) The Xpert Xpress SARS-CoV-2/FLU/RSV plus assay is intended as an aid in the diagnosis of influenza from Nasopharyngeal swab specimens and should not be used as a sole basis for treatment. Nasal washings and aspirates are unacceptable for Xpert Xpress SARS-CoV-2/FLU/RSV testing.  Fact Sheet for Patients: BloggerCourse.com  Fact Sheet for Healthcare Providers: SeriousBroker.it  This test is not yet approved or cleared by the [place] FDA and has been authorized for detection and/or diagnosis of SARS-CoV-2 by FDA under an Emergency Use Authorization (EUA). This EUA will remain in effect (meaning this test can be used) for the duration of  the COVID-19 declaration under Section 564(b)(1) of the Act, 21 U.S.C. section 360bbb-3(b)(1), unless the authorization is terminated or revoked.  Performed at [facility], [address] [place], [place] [address]      Radiological Exams on Admission: DG Chest [facility] 1 View  Result Date: 03/01/2021 CLINICAL DATA:  Palpitations, recent hospitalization for sepsis EXAM: PORTABLE CHEST 1 VIEW COMPARISON:  02/17/2021 FINDINGS: Cardiac and mediastinal contours are within normal limits. No focal pulmonary opacity. No pleural effusion or pneumothorax. No acute osseous abnormality. IMPRESSION: No acute cardiopulmonary process. Electronically Signed   By: Wiliam Ke M.D.   On: 03/01/2021 19:43    EKG: Independently reviewed.  Normal sinus rhythm.  Assessment/Plan Principal Problem:   Sepsis due to cellulitis (HCC)    IV drug abuse with history of bacteremia recently presenting back after injecting drugs concerning for bacteremia with prior history of endocarditis and recent admission 3 weeks ago and blood cultures grew Enterococcus faecalis we will keep patient empiric antibiotics follow repeat blood cultures consult infectious disease. IV drug abuse advised about quitting.  Social work consult.  Closely monitor for any withdrawals. Anemia of chronic disease follow CBC. History of ANA positive. 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  Since patient has IV drug abuse and recent bacteremia will need close monitoring and inpatient status.   DVT prophylaxis: Lovenox. Code Status: Full code. Family Communication: Discussed with patient. Disposition Plan: Home. Consults called: Child psychotherapist. Admission status: Inpatient.   Eduard Clos MD [facility] Pager [phone].  If 7PM-7AM, please contact night-coverage [email or web address] Password [email or web address]  03/02/2021, 6:06 AM

## 2021-03-02 NOTE — ED Notes (Signed)
Attempted to give medication however patient needs new IV's. Patient  appears very sleepy and irritable. Will notify the assigned nurse.

## 2021-03-02 NOTE — ED Notes (Signed)
Previous RN hung patients antibiotics however medication was never infusing.

## 2021-03-02 NOTE — Progress Notes (Signed)
PROGRESS NOTE    Karen Clarke  GNF:[id number] DOB: 25-Mar-1985 DOA: 03/01/2021 PCP: Pcp, No   Brief Narrative:  Karen Clarke is a 35 y.o. female with history of IV drug abuse and prior endocarditis who was recently admitted in February 07, 2021 for multiple skin lesions and patient also admitting to having used IV drugs - previous blood cultures came back as Enterococcus faecalis with TEE showing no valvular involvement patient was placed on ampicillin and ceftriaxone with plan for prolonged IV antibiotics.  Unfortunately patient signed out AMA after 3 days on 02/10/2021.  Patient presents back to the ED on 03/01/2021 after injecting cocaine into her arms for 2 days with worsening fevers chills and now deciding to complete her IV antibiotic course.  Hospitalist called for admission.  Assessment & Plan:   Sepsis, multifactorial in the setting of acute cellulitis secondary to IV drug abuse as well as concurrent bacteremia  -Continue IV antibiotics per discussion with pharmacy given previous cultures of Enterococcus -Remains high risk for recurrent infection given IV drug abuse ongoing and medication noncompliance -Avoid central lines at this time given likely ongoing bacteremia and high risk of leaving hospital AMA -Infectious disease consulted, appreciate insight and recommendations  IV drug abuse, ongoing Medication noncompliance, profound, life-threatening -Lengthy discussion at bedside today with patient and friend, lengthy discussion that should she continue to abuse IV drugs inject medications she remains extremely high risk for recurrent bacteremia which could result in endocarditis and life-threatening illnesses. -Her previous hospitalization ended abruptly while leaving AGAINST MEDICAL ADVICE, this was discussed at that time and again today.  If self destructive behavior continues to ongoing in this pattern would recommend psychiatry evaluation.  Anemia of chronic disease -Follow a.m. labs  hemoglobin appears to be stable at baseline  Chronic pain syndrome, unspecified -Patient is on methadone 100 mg daily per methadone clinic, confirmed by pharmacy -We will continue this medication while in-house, avoid other narcotics unless absolutely necessary  DVT prophylaxis: Lovenox. Code Status: Full code. Family Communication: Discussed with patient.   Status is: Inpatient  Dispo: The patient is from: Home              Anticipated d/c is to: To be determined              Anticipated d/c date is: Pending clinical course as above              Patient currently not medically stable for discharge given ongoing need for IV antibiotics  Consultants:  Infectious disease  Procedures:  None  Antimicrobials:  Vancomycin, cefepime  Subjective: No acute issues or events overnight denies nausea vomiting diarrhea constipation headache fevers chills or chest pain  Objective: Vitals:   03/01/21 1332 03/02/21 0100 03/02/21 0250 03/02/21 0700  BP:  (!) 111/54 (!) 114/59   Pulse:   78 67  Resp:   20 18  Temp:      TempSrc:      SpO2:   99% 97%  Weight: 90.7 kg     Height: 5\' 3"  (1.6 m)       Intake/Output Summary (Last 24 hours) at 03/02/2021 0746 Last data filed at 03/01/2021 2016 Gross per 24 hour  Intake 200 ml  Output --  Net 200 ml   Filed Weights   03/01/21 1332  Weight: 90.7 kg    Examination:  General:  Pleasantly resting in bed, No acute distress. HEENT:  Normocephalic atraumatic.  Sclerae nonicteric, noninjected.  Extraocular movements intact bilaterally. Neck:  Without mass or deformity.  Trachea is midline. Lungs:  Clear to auscultate bilaterally without rhonchi, wheeze, or rales. Heart:  Regular rate and rhythm.  Without murmurs, rubs, or gallops. Abdomen:  Soft, nontender, nondistended.  Without guarding or rebound. Extremities: Without cyanosis, clubbing, edema, or obvious deformity. Vascular:  Dorsalis pedis and posterior tibial pulses palpable  bilaterally. Skin: Multiple skin lesions throughout with notable abscess right forearm blanching erythema and tenderness to palpation  Data Reviewed: I have personally reviewed following labs and imaging studies  CBC: Recent Labs  Lab 03/01/21 1335  WBC 4.2  NEUTROABS 2.7  HGB 10.0*  HCT 33.0*  MCV 82.3  PLT 325   Basic Metabolic Panel: Recent Labs  Lab 03/01/21 1335  NA 137  K 4.0  CL 102  CO2 26  GLUCOSE 81  BUN 11  CREATININE 0.95  CALCIUM 9.2   GFR: Estimated Creatinine Clearance: 90 mL/min (by C-G formula based on SCr of 0.95 mg/dL). Liver Function Tests: Recent Labs  Lab 03/01/21 1335  AST 25  ALT 21  ALKPHOS 116  BILITOT 0.3  PROT 7.9  ALBUMIN 4.1   No results for input(s): LIPASE, AMYLASE in the last 168 hours. No results for input(s): AMMONIA in the last 168 hours. Coagulation Profile: No results for input(s): INR, PROTIME in the last 168 hours. Cardiac Enzymes: No results for input(s): CKTOTAL, CKMB, CKMBINDEX, TROPONINI in the last 168 hours. BNP (last 3 results) No results for input(s): PROBNP in the last 8760 hours. HbA1C: No results for input(s): HGBA1C in the last 72 hours. CBG: No results for input(s): GLUCAP in the last 168 hours. Lipid Profile: No results for input(s): CHOL, HDL, LDLCALC, TRIG, CHOLHDL, LDLDIRECT in the last 72 hours. Thyroid Function Tests: No results for input(s): TSH, T4TOTAL, FREET4, T3FREE, THYROIDAB in the last 72 hours. Anemia Panel: No results for input(s): VITAMINB12, FOLATE, FERRITIN, TIBC, IRON, RETICCTPCT in the last 72 hours. Sepsis Labs: Recent Labs  Lab 03/01/21 1335 03/01/21 1806  LATICACIDVEN 1.4 1.8    Recent Results (from the past 240 hour(s))  Blood culture (routine x 2)     Status: None (Preliminary result)   Collection Time: 03/01/21  1:35 PM   Specimen: BLOOD  Result Value Ref Range Status   Specimen Description BLOOD RIGHT UPPER ARM  Final   Special Requests   Final    BOTTLES DRAWN  AEROBIC AND ANAEROBIC Blood Culture adequate volume   Culture   Final    NO GROWTH < 24 HOURS Performed at [facility], [address], [place], [place] [address]    Report Status PENDING  Incomplete  Blood culture (routine x 2)     Status: None (Preliminary result)   Collection Time: 03/01/21  6:15 PM   Specimen: BLOOD  Result Value Ref Range Status   Specimen Description BLOOD BLOOD RIGHT ARM  Final   Special Requests   Final    BOTTLES DRAWN AEROBIC AND ANAEROBIC Blood Culture results may not be optimal due to an inadequate volume of blood received in culture bottles   Culture   Final    NO GROWTH < 12 HOURS Performed at [facility], [address], [place], [place] [address]    Report Status PENDING  Incomplete  Resp Panel by RT-PCR (Flu A&B, Covid) Nasopharyngeal Swab     Status: None   Collection Time: 03/01/21 10:40 PM   Specimen: Nasopharyngeal Swab; Nasopharyngeal(NP) swabs in vial transport medium  Result Value Ref Range Status  SARS Coronavirus 2 by RT PCR NEGATIVE NEGATIVE Final    Comment: (NOTE) SARS-CoV-2 target nucleic acids are NOT DETECTED.  The SARS-CoV-2 RNA is generally detectable in upper respiratory specimens during the acute phase of infection. The lowest concentration of SARS-CoV-2 viral copies this assay can detect is 138 copies/mL. A negative result does not preclude SARS-Cov-2 infection and should not be used as the sole basis for treatment or other patient management decisions. A negative result may occur with  improper specimen collection/handling, submission of specimen other than nasopharyngeal swab, presence of viral mutation(s) within the areas targeted by this assay, and inadequate number of viral copies(<138 copies/mL). A negative result must be combined with clinical observations, patient history, and epidemiological information. The expected result is Negative.  Fact Sheet for Patients:   BloggerCourse.com  Fact Sheet for Healthcare Providers:  SeriousBroker.it  This test is no t yet approved or cleared by the [place] FDA and  has been authorized for detection and/or diagnosis of SARS-CoV-2 by FDA under an Emergency Use Authorization (EUA). This EUA will remain  in effect (meaning this test can be used) for the duration of the COVID-19 declaration under Section 564(b)(1) of the Act, 21 U.S.C.section 360bbb-3(b)(1), unless the authorization is terminated  or revoked sooner.       Influenza A by PCR NEGATIVE NEGATIVE Final   Influenza B by PCR NEGATIVE NEGATIVE Final    Comment: (NOTE) The Xpert Xpress SARS-CoV-2/FLU/RSV plus assay is intended as an aid in the diagnosis of influenza from Nasopharyngeal swab specimens and should not be used as a sole basis for treatment. Nasal washings and aspirates are unacceptable for Xpert Xpress SARS-CoV-2/FLU/RSV testing.  Fact Sheet for Patients: BloggerCourse.com  Fact Sheet for Healthcare Providers: SeriousBroker.it  This test is not yet approved or cleared by the [place] FDA and has been authorized for detection and/or diagnosis of SARS-CoV-2 by FDA under an Emergency Use Authorization (EUA). This EUA will remain in effect (meaning this test can be used) for the duration of the COVID-19 declaration under Section 564(b)(1) of the Act, 21 U.S.C. section 360bbb-3(b)(1), unless the authorization is terminated or revoked.  Performed at [facility], [address], [place], [place] [address]          Radiology Studies: DG Chest [place] 1 View  Result Date: 03/01/2021 CLINICAL DATA:  Palpitations, recent hospitalization for sepsis EXAM: PORTABLE CHEST 1 VIEW COMPARISON:  02/17/2021 FINDINGS: Cardiac and mediastinal contours are within normal limits. No focal pulmonary opacity. No pleural effusion or  pneumothorax. No acute osseous abnormality. IMPRESSION: No acute cardiopulmonary process. Electronically Signed   By: Wiliam Ke M.D.   On: 03/01/2021 19:43        Scheduled Meds:  enoxaparin (LOVENOX) injection  0.5 mg/kg Subcutaneous Q24H   methadone  10 mg Oral Daily   prazosin  2 mg Oral QHS   QUEtiapine  100 mg Oral QHS   Continuous Infusions:  sodium chloride 100 mL/hr at 03/01/21 2237   ampicillin (OMNIPEN) IV Stopped (03/02/21 0313)   ceFEPime (MAXIPIME) IV 200 mL/hr at 03/02/21 0646   vancomycin       LOS: 1 day   Time spent:  Azucena Fallen, DO [facility]  If 7PM-7AM, please contact night-coverage [email or web address]  03/02/2021, 7:46 AM

## 2021-03-02 NOTE — ED Notes (Signed)
Informed RN bed assigned 

## 2021-03-03 DIAGNOSIS — A419 Sepsis, unspecified organism: Secondary | ICD-10-CM | POA: Diagnosis not present

## 2021-03-03 DIAGNOSIS — L0390 Cellulitis, unspecified: Secondary | ICD-10-CM | POA: Diagnosis not present

## 2021-03-03 LAB — BASIC METABOLIC PANEL
Anion gap: 4 — ABNORMAL LOW (ref 5–15)
BUN: 8 mg/dL (ref 6–20)
CO2: 26 mmol/L (ref 22–32)
Calcium: 8.2 mg/dL — ABNORMAL LOW (ref 8.9–10.3)
Chloride: 107 mmol/L (ref 98–111)
Creatinine, Ser: 0.76 mg/dL (ref 0.44–1.00)
GFR, Estimated: >60 mL/min (ref >60)
Glucose, Bld: 95 mg/dL (ref 70–99)
Potassium: 3.8 mmol/L (ref 3.5–5.1)
Sodium: 137 mmol/L (ref 135–145)

## 2021-03-03 LAB — CBC
HCT: 27.2 % — ABNORMAL LOW (ref 36.0–46.0)
Hemoglobin: 8.2 g/dL — ABNORMAL LOW (ref 12.0–15.0)
MCH: 24.3 pg — ABNORMAL LOW (ref 26.0–34.0)
MCHC: 30.1 g/dL (ref 30.0–36.0)
MCV: 80.5 fL (ref 80.0–100.0)
Platelets: 262 10*3/uL (ref 150–400)
RBC: 3.38 MIL/uL — ABNORMAL LOW (ref 3.87–5.11)
RDW: 15.3 % (ref 11.5–15.5)
WBC: 5.1 10*3/uL (ref 4.0–10.5)
nRBC: 0 % (ref 0.0–0.2)

## 2021-03-03 MED ORDER — KETOROLAC TROMETHAMINE 30 MG/ML IJ SOLN: 30.0000 mg | Freq: Once | INTRAMUSCULAR | Status: DC

## 2021-03-03 NOTE — Progress Notes (Signed)
Upon entering patient's room to hang antibiotic, IV pump noted to be turned off and line disconnected from patient. When patient was asked about it, she stated that she turned it off because it was "annoying." Patient educated on need to call for assistance and IV panel locked to prevent further tampering.  Visitor at bedside. Patient does not seem altered currently and is sitting up in bed eating fast food. MD made aware.

## 2021-03-03 NOTE — Progress Notes (Signed)
Spoke with patient at length about her current health condition and addiction status. Patient noted to be actively searching for a primary care provider and possibilities for insurance coverage (marketplace, etc). Patient admits being sober is, at times, a daunting task. 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 However, she does admit to recently injecting other substances. Patient voices having a good support system in her boyfriend of the last 5 years. She states he does not have a history of addiction and encourages her to "stay clean." When asked about other positives or reasons sobriety, patient states she has two daughters who are currently living with their biological father.  Patient encouraged to seek out mental health treatment/therapy to address the root causes of her drug use. Assisted patient to brain storm coping mechanisms that may be useful in the future as opposed to using drugs.  Patient actively involved in conversation and appears to want to make a change in her lifestyle.

## 2021-03-03 NOTE — Progress Notes (Signed)
PROGRESS NOTE    Karen Clarke  XBM:[id number] DOB: [birth year] DOA: 03/01/2021 PCP: Pcp, No   Brief Narrative:  Karen Clarke is a 35 y.o. female with history of IV drug abuse and prior endocarditis who was recently admitted in February 07, 2021 for multiple skin lesions and patient also admitting to having used IV drugs - previous blood cultures came back as Enterococcus faecalis with TEE showing no valvular involvement patient was placed on ampicillin and ceftriaxone with plan for prolonged IV antibiotics.  Unfortunately patient signed out AMA after 3 days on 02/10/2021.  Patient presents back to the ED on 03/01/2021 after injecting cocaine into her arms for 2 days with worsening fevers chills and now deciding to complete her IV antibiotic course.  Hospitalist called for admission.  Assessment & Plan:   Sepsis, multifactorial in the setting of acute cellulitis secondary to IV drug abuse as well as concurrent bacteremia  -Continue IV antibiotics per discussion with pharmacy given previous cultures of Enterococcus -Remains high risk for recurrent infection given IV drug abuse ongoing and medication noncompliance -Remains high risk of leaving hospital AMA with IV access -Infectious disease consulted, appreciate insight and recommendations  IV drug abuse, ongoing Medication noncompliance, profound, life-threatening -Lengthy discussion at bedside today with patient and friend, lengthy discussion that should she continue to abuse IV drugs inject medications she remains extremely high risk for recurrent bacteremia which could result in endocarditis and life-threatening illnesses. -Her previous hospitalization ended abruptly while leaving AGAINST MEDICAL ADVICE, this was discussed at that time and again today.  If self destructive behavior continues to ongoing in this pattern would recommend psychiatry evaluation.  Anemia of chronic disease -Follow a.m. labs hemoglobin appears to be stable at  baseline  Chronic pain syndrome, unspecified -Patient is on methadone 100 mg daily per methadone clinic, confirmed by pharmacy -We will continue this medication while in-house, avoid other narcotics unless absolutely necessary  DVT prophylaxis: Lovenox. Code Status: Full code. Family Communication: Discussed with patient.   Status is: Inpatient  Dispo: The patient is from: Home              Anticipated d/c is to: To be determined              Anticipated d/c date is: Pending clinical course as above              Patient currently not medically stable for discharge given ongoing need for IV antibiotics  Consultants:  Infectious disease  Procedures:  None  Antimicrobials:  Vancomycin, cefepime  Subjective: No acute issues or events overnight denies nausea vomiting diarrhea constipation headache fevers chills or chest pain  Objective: Vitals:   03/02/21 1400 03/02/21 1617 03/02/21 2053 03/03/21 0603  BP: 110/70 110/70 135/84 (!) 98/54  Pulse: 64 74 (!) 101 70  Resp: Temp:   98.8 F (37.1 C) 97.7 F (36.5 C)  TempSrc:   Oral Oral  SpO2: 96% 97% 100% 95%  Weight:      Height:        Intake/Output Summary (Last 24 hours) at 03/03/2021 0707 Last data filed at 03/03/2021 0300 Gross per 24 hour  Intake 2030.37 ml  Output --  Net 2030.37 ml    Filed Weights   03/01/21 1332  Weight: 90.7 kg    Examination:  General:  Pleasantly resting in bed, No acute distress. HEENT:  Normocephalic atraumatic.  Sclerae nonicteric, noninjected.  Extraocular movements intact bilaterally. Neck:  Without mass or  deformity.  Trachea is midline. Lungs:  Clear to auscultate bilaterally without rhonchi, wheeze, or rales. Heart:  Regular rate and rhythm.  Without murmurs, rubs, or gallops. Abdomen:  Soft, nontender, nondistended.  Without guarding or rebound. Extremities: Without cyanosis, clubbing, edema, or obvious deformity. Vascular:  Dorsalis pedis and posterior  tibial pulses palpable bilaterally. Skin: Multiple skin lesions throughout with notable abscess right forearm blanching erythema and tenderness to palpation  Data Reviewed: I have personally reviewed following labs and imaging studies  CBC: Recent Labs  Lab 03/01/21 1335 03/02/21 0858 03/03/21 0359  WBC 4.2 4.7 5.1  NEUTROABS 2.7  --   --   HGB 10.0* 8.6* 8.2*  HCT 33.0* 27.6* 27.2*  MCV 82.3 79.3* 80.5  PLT 325 250 262    Basic Metabolic Panel: Recent Labs  Lab 03/01/21 1335 03/02/21 0858 03/03/21 0359  NA 137 139 137  K 4.0 4.2 3.8  CL 102 109 107  CO2 26 22 26   GLUCOSE 81 80 95  BUN 11 9 8   CREATININE 0.95 0.68 0.76  CALCIUM 9.2 8.1* 8.2*    GFR: Estimated Creatinine Clearance: 106.9 mL/min (by C-G formula based on SCr of 0.76 mg/dL). Liver Function Tests: Recent Labs  Lab 03/01/21 1335  AST 25  ALT 21  ALKPHOS 116  BILITOT 0.3  PROT 7.9  ALBUMIN 4.1    No results for input(s): LIPASE, AMYLASE in the last 168 hours. No results for input(s): AMMONIA in the last 168 hours. Coagulation Profile: No results for input(s): INR, PROTIME in the last 168 hours. Cardiac Enzymes: No results for input(s): CKTOTAL, CKMB, CKMBINDEX, TROPONINI in the last 168 hours. BNP (last 3 results) No results for input(s): PROBNP in the last 8760 hours. HbA1C: No results for input(s): HGBA1C in the last 72 hours. CBG: No results for input(s): GLUCAP in the last 168 hours. Lipid Profile: No results for input(s): CHOL, HDL, LDLCALC, TRIG, CHOLHDL, LDLDIRECT in the last 72 hours. Thyroid Function Tests: No results for input(s): TSH, T4TOTAL, FREET4, T3FREE, THYROIDAB in the last 72 hours. Anemia Panel: No results for input(s): VITAMINB12, FOLATE, FERRITIN, TIBC, IRON, RETICCTPCT in the last 72 hours. Sepsis Labs: Recent Labs  Lab 03/01/21 1335 03/01/21 1806  LATICACIDVEN 1.4 1.8     Recent Results (from the past 240 hour(s))  Blood culture (routine x 2)     Status:  None (Preliminary result)   Collection Time: 03/01/21  1:35 PM   Specimen: BLOOD  Result Value Ref Range Status   Specimen Description BLOOD RIGHT UPPER ARM  Final   Special Requests   Final    BOTTLES DRAWN AEROBIC AND ANAEROBIC Blood Culture adequate volume   Culture   Final    NO GROWTH 2 DAYS Performed at [facility], [address], [place], [address] [place]    Report Status PENDING  Incomplete  Blood culture (routine x 2)     Status: None (Preliminary result)   Collection Time: 03/01/21  6:15 PM   Specimen: BLOOD  Result Value Ref Range Status   Specimen Description BLOOD BLOOD RIGHT ARM  Final   Special Requests   Final    BOTTLES DRAWN AEROBIC AND ANAEROBIC Blood Culture results may not be optimal due to an inadequate volume of blood received in culture bottles   Culture   Final    NO GROWTH 2 DAYS Performed at [facility], [address], [place], [address] [place]    Report Status PENDING  Incomplete  Resp Panel by RT-PCR (Flu A&B, Covid) Nasopharyngeal Swab     Status: None   Collection Time: 03/01/21 10:40 PM   Specimen: Nasopharyngeal Swab; Nasopharyngeal(NP) swabs in vial transport medium  Result Value Ref Range Status   SARS Coronavirus 2 by RT PCR NEGATIVE NEGATIVE Final    Comment: (NOTE) SARS-CoV-2 target nucleic acids are NOT DETECTED.  The SARS-CoV-2 RNA is generally detectable in upper respiratory specimens during the acute phase of infection. The lowest concentration of SARS-CoV-2 viral copies this assay can detect is 138 copies/mL. A negative result does not preclude SARS-Cov-2 infection and should not be used as the sole basis for treatment or other patient management decisions. A negative result may occur with  improper specimen collection/handling, submission of specimen other than nasopharyngeal swab, presence of viral mutation(s) within the areas targeted by this assay, and inadequate number of viral copies(<138 copies/mL). A  negative result must be combined with clinical observations, patient history, and epidemiological information. The expected result is Negative.  Fact Sheet for Patients:  BloggerCourse.com  Fact Sheet for Healthcare Providers:  SeriousBroker.it  This test is no t yet approved or cleared by the [place] FDA and  has been authorized for detection and/or diagnosis of SARS-CoV-2 by FDA under an Emergency Use Authorization (EUA). This EUA will remain  in effect (meaning this test can be used) for the duration of the COVID-19 declaration under Section 564(b)(1) of the Act, 21 U.S.C.section 360bbb-3(b)(1), unless the authorization is terminated  or revoked sooner.       Influenza A by PCR NEGATIVE NEGATIVE Final   Influenza B by PCR NEGATIVE NEGATIVE Final    Comment: (NOTE) The Xpert Xpress SARS-CoV-2/FLU/RSV plus assay is intended as an aid in the diagnosis of influenza from Nasopharyngeal swab specimens and should not be used as a sole basis for treatment. Nasal washings and aspirates are unacceptable for Xpert Xpress SARS-CoV-2/FLU/RSV testing.  Fact Sheet for Patients: BloggerCourse.com  Fact Sheet for Healthcare Providers: SeriousBroker.it  This test is not yet approved or cleared by the [place] FDA and has been authorized for detection and/or diagnosis of SARS-CoV-2 by FDA under an Emergency Use Authorization (EUA). This EUA will remain in effect (meaning this test can be used) for the duration of the COVID-19 declaration under Section 564(b)(1) of the Act, 21 U.S.C. section 360bbb-3(b)(1), unless the authorization is terminated or revoked.  Performed at [facility], [address] [place], [place] [address]           Radiology Studies: DG Chest [place] 1 View  Result Date: 03/01/2021 CLINICAL DATA:  Palpitations, recent hospitalization for  sepsis EXAM: PORTABLE CHEST 1 VIEW COMPARISON:  02/17/2021 FINDINGS: Cardiac and mediastinal contours are within normal limits. No focal pulmonary opacity. No pleural effusion or pneumothorax. No acute osseous abnormality. IMPRESSION: No acute cardiopulmonary process. Electronically Signed   By: Wiliam Ke M.D.   On: 03/01/2021 19:43        Scheduled Meds:  enoxaparin (LOVENOX) injection  0.5 mg/kg Subcutaneous Q24H   methadone  100 mg Oral Daily   prazosin  2 mg Oral QHS   QUEtiapine  100 mg Oral QHS   sodium chloride flush  10-40 mL Intracatheter Q12H   Continuous Infusions:  sodium chloride 100 mL/hr at 03/03/21 0603   ceFEPime (MAXIPIME) IV 2 g (03/03/21 0604)   vancomycin 750 mg (03/03/21 0011)     LOS: 2 days   Time spent:  Azucena Fallen, DO [facility]  If 7PM-7AM, please contact night-coverage [email or web address]  03/03/2021, 7:07 AM

## 2021-03-04 DIAGNOSIS — A419 Sepsis, unspecified organism: Secondary | ICD-10-CM | POA: Diagnosis not present

## 2021-03-04 DIAGNOSIS — L0390 Cellulitis, unspecified: Secondary | ICD-10-CM | POA: Diagnosis not present

## 2021-03-04 MED ORDER — MUPIROCIN 2 % EX OINT
TOPICAL_OINTMENT | Freq: Two times a day (BID) | CUTANEOUS | Status: DC
  Filled 2021-03-04 (×2): qty 22

## 2021-03-04 NOTE — Consult Note (Signed)
WOC Nurse Consult Note: Patient receiving care in [facility] Consult completed remotely after review of images, flow sheet and history.  Reason for Consult: LLE and RUE ulcers Wound type: Images of ulcers on the LLE and RUE previously scabbed over surrounded by erythema. Pressure Injury POA: NA Measurement: Bedside nurse to place in flowsheet Dressing procedure/placement/frequency: Cleanse the LLE and RUE areas with soap and water, rinse and pat dry. Apply Mupirocin ointment over the areas and cover with foam dressings. Clean and reapply twice daily.  Monitor the wound area(s) for worsening of condition such as: Signs/symptoms of infection, increase in size, development of or worsening of odor, development of pain, or increased pain at the affected locations.   Notify the medical team if any of these develop.  Thank you for the consult. [place] nurse will not follow at this time.   Please re-consult the [place] team if needed.  Cathlean Marseilles Tamala Julian, MSN, RN, [place], Lysle Pearl, [facility] Wound Treatment Associate Pager [phone]

## 2021-03-04 NOTE — Progress Notes (Signed)
PROGRESS NOTE    [name]  EPP:[id number] DOB: 12/22/1985 DOA: 03/01/2021 PCP: Pcp, No   Brief Narrative:  Karen Clarke is a 36 y.o. female with history of IV drug abuse and prior endocarditis who was recently admitted in February 07, 2021 for multiple skin lesions and patient also admitting to having used IV drugs - previous blood cultures came back as Enterococcus faecalis with TEE showing no valvular involvement patient was placed on ampicillin and ceftriaxone with plan for prolonged IV antibiotics.  Unfortunately patient signed out AMA after 3 days on 02/10/2021.  Patient presents back to the ED on 03/01/2021 after injecting cocaine into her arms for 2 days with worsening fevers chills and now deciding to complete her IV antibiotic course.  Hospitalist called for admission.  Assessment & Plan:   Sepsis, multifactorial in the setting of acute cellulitis secondary to IV drug abuse as well as concurrent bacteremia  -Continue IV antibiotics per discussion with pharmacy given previous cultures of Enterococcus -Remains high risk for recurrent infection given IV drug abuse ongoing and medication noncompliance -Remains high risk of leaving hospital AMA with IV access -Infectious disease consulted, appreciate insight and recommendations  IV drug abuse, ongoing Medication noncompliance, profound, life-threatening -Lengthy discussion at bedside today with patient and friend, lengthy discussion that should she continue to abuse IV drugs inject medications she remains extremely high risk for recurrent bacteremia which could result in endocarditis and life-threatening illnesses. -Her previous hospitalization ended abruptly while leaving AGAINST MEDICAL ADVICE, this was discussed at that time and again today.  If self destructive behavior continues to ongoing in this pattern would recommend psychiatry evaluation.  Anemia of chronic disease -Follow a.m. labs hemoglobin appears to be stable at  baseline  Chronic pain syndrome, unspecified -Patient is on methadone 100 mg daily per methadone clinic, confirmed by pharmacy -We will continue this medication while in-house, avoid other narcotics unless absolutely necessary  DVT prophylaxis: Lovenox. Code Status: Full code. Family Communication: Discussed with patient.   Status is: Inpatient  Dispo: The patient is from: Home              Anticipated d/c is to: To be determined              Anticipated d/c date is: Pending clinical course as above              Patient currently not medically stable for discharge given ongoing need for IV antibiotics  Consultants:  Infectious disease  Procedures:  None  Antimicrobials:  Vancomycin, cefepime  Subjective: No acute issues or events overnight denies nausea vomiting diarrhea constipation headache fevers chills or chest pain  Objective: Vitals:   03/03/21 1547 03/04/21 0101 03/04/21 0523 03/04/21 0547  BP: 126/76 115/80 (!) 95/50 (!) 97/54  Pulse: 73 92 64 64  Resp: 20 14 11 14   Temp: (!) 97.4 F (36.3 C) 98.3 F (36.8 C)  97.7 F (36.5 C)  TempSrc: Oral Oral  Oral  SpO2: 100% 99% 95% 97%  Weight:      Height:        Intake/Output Summary (Last 24 hours) at 03/04/2021 0717 Last data filed at 03/04/2021 0440 Gross per 24 hour  Intake 1959.07 ml  Output --  Net 1959.07 ml    Filed Weights   03/01/21 1332  Weight: 90.7 kg    Examination:  General:  Pleasantly resting in bed, No acute distress. HEENT:  Normocephalic atraumatic.  Sclerae nonicteric, noninjected.  Extraocular movements intact bilaterally. Neck:  Without mass or deformity.  Trachea is midline. Lungs:  Clear to auscultate bilaterally without rhonchi, wheeze, or rales. Heart:  Regular rate and rhythm.  Without murmurs, rubs, or gallops. Abdomen:  Soft, nontender, nondistended.  Without guarding or rebound. Extremities: Without cyanosis, clubbing, edema, or obvious deformity. Vascular:  Dorsalis pedis  and posterior tibial pulses palpable bilaterally. Skin: Multiple skin lesions throughout with notable abscess right forearm blanching erythema and tenderness to palpation  Data Reviewed: I have personally reviewed following labs and imaging studies  CBC: Recent Labs  Lab 03/01/21 1335 03/02/21 0858 03/03/21 0359  WBC 4.2 4.7 5.1  NEUTROABS 2.7  --   --   HGB 10.0* 8.6* 8.2*  HCT 33.0* 27.6* 27.2*  MCV 82.3 79.3* 80.5  PLT 325 250 262    Basic Metabolic Panel: Recent Labs  Lab 03/01/21 1335 03/02/21 0858 03/03/21 0359  NA 137 139 137  K 4.0 4.2 3.8  CL 102 109 107  CO2 26 22 26   GLUCOSE 81 80 95  BUN 11 9 8   CREATININE 0.95 0.68 0.76  CALCIUM 9.2 8.1* 8.2*    GFR: Estimated Creatinine Clearance: 106.9 mL/min (by C-G formula based on SCr of 0.76 mg/dL). Liver Function Tests: Recent Labs  Lab 03/01/21 1335  AST 25  ALT 21  ALKPHOS 116  BILITOT 0.3  PROT 7.9  ALBUMIN 4.1    No results for input(s): LIPASE, AMYLASE in the last 168 hours. No results for input(s): AMMONIA in the last 168 hours. Coagulation Profile: No results for input(s): INR, PROTIME in the last 168 hours. Cardiac Enzymes: No results for input(s): CKTOTAL, CKMB, CKMBINDEX, TROPONINI in the last 168 hours. BNP (last 3 results) No results for input(s): PROBNP in the last 8760 hours. HbA1C: No results for input(s): HGBA1C in the last 72 hours. CBG: No results for input(s): GLUCAP in the last 168 hours. Lipid Profile: No results for input(s): CHOL, HDL, LDLCALC, TRIG, CHOLHDL, LDLDIRECT in the last 72 hours. Thyroid Function Tests: No results for input(s): TSH, T4TOTAL, FREET4, T3FREE, THYROIDAB in the last 72 hours. Anemia Panel: No results for input(s): VITAMINB12, FOLATE, FERRITIN, TIBC, IRON, RETICCTPCT in the last 72 hours. Sepsis Labs: Recent Labs  Lab 03/01/21 1335 03/01/21 1806  LATICACIDVEN 1.4 1.8     Recent Results (from the past 240 hour(s))  Blood culture (routine x 2)      Status: None (Preliminary result)   Collection Time: 03/01/21  1:35 PM   Specimen: BLOOD  Result Value Ref Range Status   Specimen Description BLOOD RIGHT UPPER ARM  Final   Special Requests   Final    BOTTLES DRAWN AEROBIC AND ANAEROBIC Blood Culture adequate volume   Culture   Final    NO GROWTH 3 DAYS Performed at [facility], [address], [place], [address] [place]    Report Status PENDING  Incomplete  Blood culture (routine x 2)     Status: None (Preliminary result)   Collection Time: 03/01/21  6:15 PM   Specimen: BLOOD  Result Value Ref Range Status   Specimen Description BLOOD BLOOD RIGHT ARM  Final   Special Requests   Final    BOTTLES DRAWN AEROBIC AND ANAEROBIC Blood Culture results may not be optimal due to an inadequate volume of blood received in culture bottles   Culture   Final    NO GROWTH 3 DAYS Performed at [facility], [address], [place], [address] [place]    Report Status PENDING  Incomplete  Resp Panel by RT-PCR (Flu A&B, Covid) Nasopharyngeal Swab     Status: None   Collection Time: 03/01/21 10:40 PM   Specimen: Nasopharyngeal Swab; Nasopharyngeal(NP) swabs in vial transport medium  Result Value Ref Range Status   SARS Coronavirus 2 by RT PCR NEGATIVE NEGATIVE Final    Comment: (NOTE) SARS-CoV-2 target nucleic acids are NOT DETECTED.  The SARS-CoV-2 RNA is generally detectable in upper respiratory specimens during the acute phase of infection. The lowest concentration of SARS-CoV-2 viral copies this assay can detect is 138 copies/mL. A negative result does not preclude SARS-Cov-2 infection and should not be used as the sole basis for treatment or other patient management decisions. A negative result may occur with  improper specimen collection/handling, submission of specimen other than nasopharyngeal swab, presence of viral mutation(s) within the areas targeted by this assay, and inadequate number of viral copies(<138  copies/mL). A negative result must be combined with clinical observations, patient history, and epidemiological information. The expected result is Negative.  Fact Sheet for Patients:  BloggerCourse.comhttps://www.fda.gov/media/152166/download  Fact Sheet for Healthcare Providers:  SeriousBroker.ithttps://www.fda.gov/media/152162/download  This test is no t yet approved or cleared by the [place] FDA and  has been authorized for detection and/or diagnosis of SARS-CoV-2 by FDA under an Emergency Use Authorization (EUA). This EUA will remain  in effect (meaning this test can be used) for the duration of the COVID-19 declaration under Section 564(b)(1) of the Act, 21 U.S.C.section 360bbb-3(b)(1), unless the authorization is terminated  or revoked sooner.       Influenza A by PCR NEGATIVE NEGATIVE Final   Influenza B by PCR NEGATIVE NEGATIVE Final    Comment: (NOTE) The Xpert Xpress SARS-CoV-2/FLU/RSV plus assay is intended as an aid in the diagnosis of influenza from Nasopharyngeal swab specimens and should not be used as a sole basis for treatment. Nasal washings and aspirates are unacceptable for Xpert Xpress SARS-CoV-2/FLU/RSV testing.  Fact Sheet for Patients: BloggerCourse.comhttps://www.fda.gov/media/152166/download  Fact Sheet for Healthcare Providers: SeriousBroker.ithttps://www.fda.gov/media/152162/download  This test is not yet approved or cleared by the [place] FDA and has been authorized for detection and/or diagnosis of SARS-CoV-2 by FDA under an Emergency Use Authorization (EUA). This EUA will remain in effect (meaning this test can be used) for the duration of the COVID-19 declaration under Section 564(b)(1) of the Act, 21 U.S.C. section 360bbb-3(b)(1), unless the authorization is terminated or revoked.  Performed at [facility], [address] [place], [place] [address]           Radiology Studies: No results found.      Scheduled Meds:  enoxaparin (LOVENOX) injection  0.5 mg/kg  Subcutaneous Q24H   methadone  100 mg Oral Daily   prazosin  2 mg Oral QHS   QUEtiapine  100 mg Oral QHS   sodium chloride flush  10-40 mL Intracatheter Q12H   Continuous Infusions:  sodium chloride 100 mL/hr at 03/03/21 0603   ceFEPime (MAXIPIME) IV 2 g (03/04/21 0544)   vancomycin Stopped (03/04/21 0105)     LOS: 3 days   Time spent: 45min  Azucena FallenWilliam C Lyndall Bellot, DO [facility]  If 7PM-7AM, please contact night-coverage [email or web address]  03/04/2021, 7:17 AM

## 2021-03-05 DIAGNOSIS — A419 Sepsis, unspecified organism: Secondary | ICD-10-CM | POA: Diagnosis not present

## 2021-03-05 DIAGNOSIS — L0390 Cellulitis, unspecified: Secondary | ICD-10-CM | POA: Diagnosis not present

## 2021-03-05 NOTE — Progress Notes (Signed)
[place] PD has performed well-check and reported that patient has removed PIV(Midline). Charge RN on unit made aware.

## 2021-03-05 NOTE — Progress Notes (Addendum)
Per unit staff - patient has eloped from unit and returned to primary residence. Charge RN on unit encouraged patient to return per phone call to primary residence. Per Consulting civil engineer, patient self-reported removal of IV access. AC attempted to call primary residence. No answer.   Non Emergent Number for [place] [employer] notified [phone] with address provided per demographics for well-check, IV placement.

## 2021-03-05 NOTE — Progress Notes (Signed)
PROGRESS NOTE    Karen Clarke  NWG:[id number] DOB: 11/18/1985 DOA: 03/01/2021 PCP: Pcp, No   Brief Narrative:  Karen Clarke is a 36 y.o. female with history of IV drug abuse and prior endocarditis who was recently admitted in February 07, 2021 for multiple skin lesions and patient also admitting to having used IV drugs - previous blood cultures came back as Enterococcus faecalis with TEE showing no valvular involvement patient was placed on ampicillin and ceftriaxone with plan for prolonged IV antibiotics.  Unfortunately patient signed out AMA after 3 days on 02/10/2021.  Patient presents back to the ED on 03/01/2021 after injecting cocaine into her arms for 2 days with worsening fevers chills and now deciding to complete her IV antibiotic course.  Hospitalist called for admission.  Assessment & Plan:   Sepsis, multifactorial in the setting of acute cellulitis secondary to IV drug abuse as well as concurrent bacteremia  -Continue IV antibiotics per discussion with pharmacy given previous cultures of Enterococcus -Remains high risk for recurrent infection given IV drug abuse ongoing and medication noncompliance -Remains high risk of leaving hospital AMA with IV access -Infectious disease consulted, appreciate insight and recommendations  IV drug abuse, ongoing Medication noncompliance, profound, life-threatening -Lengthy discussion at bedside daily in regards to high risk for recurrent bacteremia which could result in endocarditis and life-threatening illnesses. if she continues to abuse IV medications -Her previous hospitalization ended abruptly while leaving AGAINST MEDICAL ADVICE -Continue methadone 100 mg daily as below  Anemia of chronic disease -Stable, follow labs  Chronic pain syndrome, unspecified -Patient is on methadone 100 mg daily per methadone clinic, confirmed by pharmacy -We will continue this medication while in-house, avoid other narcotics unless absolutely necessary  DVT  prophylaxis: Lovenox. Code Status: Full code. Family Communication: Discussed with patient.   Status is: Inpatient  Dispo: The patient is from: Home              Anticipated d/c is to: To be determined              Anticipated d/c date is: Pending clinical course as above              Patient currently not medically stable for discharge given ongoing need for IV antibiotics  Consultants:  Infectious disease  Procedures:  None  Antimicrobials:  Vancomycin, cefepime  Subjective: No acute issues or events overnight denies nausea vomiting diarrhea constipation headache fevers chills or chest pain  Objective: Vitals:   03/04/21 1356 03/04/21 2153 03/05/21 0037 03/05/21 0451  BP: 116/63 (!) 108/55 (!) 97/50 (!) 96/54  Pulse: 71 83 68 (!) 59  Resp: 18 16 16 16   Temp: 98 F (36.7 C) 98 F (36.7 C) 97.9 F (36.6 C) 98.3 F (36.8 C)  TempSrc: Oral Oral Oral Oral  SpO2: 100% 96% 96% 97%  Weight:      Height:        Intake/Output Summary (Last 24 hours) at 03/05/2021 0714 Last data filed at 03/05/2021 0211 Gross per 24 hour  Intake 2514.24 ml  Output --  Net 2514.24 ml    Filed Weights   03/01/21 1332  Weight: 90.7 kg    Examination:  General:  Pleasantly resting in bed, No acute distress. HEENT:  Normocephalic atraumatic.  Sclerae nonicteric, noninjected.  Extraocular movements intact bilaterally. Neck:  Without mass or deformity.  Trachea is midline. Lungs:  Clear to auscultate bilaterally without rhonchi, wheeze, or rales. Heart:  Regular rate and rhythm.  Without murmurs,  rubs, or gallops. Abdomen:  Soft, nontender, nondistended.  Without guarding or rebound. Extremities: Without cyanosis, clubbing, edema, or obvious deformity. Vascular:  Dorsalis pedis and posterior tibial pulses palpable bilaterally. Skin: Multiple skin lesions throughout with notable abscess right forearm blanching erythema and tenderness to palpation  Data Reviewed: I have personally reviewed  following labs and imaging studies  CBC: Recent Labs  Lab 03/01/21 1335 03/02/21 0858 03/03/21 0359  WBC 4.2 4.7 5.1  NEUTROABS 2.7  --   --   HGB 10.0* 8.6* 8.2*  HCT 33.0* 27.6* 27.2*  MCV 82.3 79.3* 80.5  PLT 325 250 262    Basic Metabolic Panel: Recent Labs  Lab 03/01/21 1335 03/02/21 0858 03/03/21 0359  NA 137 139 137  K 4.0 4.2 3.8  CL 102 109 107  CO2 26 22 26   GLUCOSE 81 80 95  BUN 11 9 8   CREATININE 0.95 0.68 0.76  CALCIUM 9.2 8.1* 8.2*    GFR: Estimated Creatinine Clearance: 106.9 mL/min (by C-G formula based on SCr of 0.76 mg/dL). Liver Function Tests: Recent Labs  Lab 03/01/21 1335  AST 25  ALT 21  ALKPHOS 116  BILITOT 0.3  PROT 7.9  ALBUMIN 4.1    No results for input(s): LIPASE, AMYLASE in the last 168 hours. No results for input(s): AMMONIA in the last 168 hours. Coagulation Profile: No results for input(s): INR, PROTIME in the last 168 hours. Cardiac Enzymes: No results for input(s): CKTOTAL, CKMB, CKMBINDEX, TROPONINI in the last 168 hours. BNP (last 3 results) No results for input(s): PROBNP in the last 8760 hours. HbA1C: No results for input(s): HGBA1C in the last 72 hours. CBG: No results for input(s): GLUCAP in the last 168 hours. Lipid Profile: No results for input(s): CHOL, HDL, LDLCALC, TRIG, CHOLHDL, LDLDIRECT in the last 72 hours. Thyroid Function Tests: No results for input(s): TSH, T4TOTAL, FREET4, T3FREE, THYROIDAB in the last 72 hours. Anemia Panel: No results for input(s): VITAMINB12, FOLATE, FERRITIN, TIBC, IRON, RETICCTPCT in the last 72 hours. Sepsis Labs: Recent Labs  Lab 03/01/21 1335 03/01/21 1806  LATICACIDVEN 1.4 1.8     Recent Results (from the past 240 hour(s))  Blood culture (routine x 2)     Status: None (Preliminary result)   Collection Time: 03/01/21  1:35 PM   Specimen: BLOOD  Result Value Ref Range Status   Specimen Description BLOOD RIGHT UPPER ARM  Final   Special Requests   Final     BOTTLES DRAWN AEROBIC AND ANAEROBIC Blood Culture adequate volume   Culture   Final    NO GROWTH 3 DAYS Performed at [facility], [address] [place], [address] [place]    Report Status PENDING  Incomplete  Blood culture (routine x 2)     Status: None (Preliminary result)   Collection Time: 03/01/21  6:15 PM   Specimen: BLOOD  Result Value Ref Range Status   Specimen Description BLOOD BLOOD RIGHT ARM  Final   Special Requests   Final    BOTTLES DRAWN AEROBIC AND ANAEROBIC Blood Culture results may not be optimal due to an inadequate volume of blood received in culture bottles   Culture   Final    NO GROWTH 3 DAYS Performed at [facility], [address] [place], [address] [place]    Report Status PENDING  Incomplete  Resp Panel by RT-PCR (Flu A&B, Covid) Nasopharyngeal Swab     Status: None   Collection Time: 03/01/21 10:40 PM   Specimen:  Nasopharyngeal Swab; Nasopharyngeal(NP) swabs in vial transport medium  Result Value Ref Range Status   SARS Coronavirus 2 by RT PCR NEGATIVE NEGATIVE Final    Comment: (NOTE) SARS-CoV-2 target nucleic acids are NOT DETECTED.  The SARS-CoV-2 RNA is generally detectable in upper respiratory specimens during the acute phase of infection. The lowest concentration of SARS-CoV-2 viral copies this assay can detect is 138 copies/mL. A negative result does not preclude SARS-Cov-2 infection and should not be used as the sole basis for treatment or other patient management decisions. A negative result may occur with  improper specimen collection/handling, submission of specimen other than nasopharyngeal swab, presence of viral mutation(s) within the areas targeted by this assay, and inadequate number of viral copies(<138 copies/mL). A negative result must be combined with clinical observations, patient history, and epidemiological information. The expected result is Negative.  Fact Sheet for Patients:   BloggerCourse.comhttps://www.fda.gov/media/152166/download  Fact Sheet for Healthcare Providers:  SeriousBroker.ithttps://www.fda.gov/media/152162/download  This test is no t yet approved or cleared by the [place] FDA and  has been authorized for detection and/or diagnosis of SARS-CoV-2 by FDA under an Emergency Use Authorization (EUA). This EUA will remain  in effect (meaning this test can be used) for the duration of the COVID-19 declaration under Section 564(b)(1) of the Act, 21 U.S.C.section 360bbb-3(b)(1), unless the authorization is terminated  or revoked sooner.       Influenza A by PCR NEGATIVE NEGATIVE Final   Influenza B by PCR NEGATIVE NEGATIVE Final    Comment: (NOTE) The Xpert Xpress SARS-CoV-2/FLU/RSV plus assay is intended as an aid in the diagnosis of influenza from Nasopharyngeal swab specimens and should not be used as a sole basis for treatment. Nasal washings and aspirates are unacceptable for Xpert Xpress SARS-CoV-2/FLU/RSV testing.  Fact Sheet for Patients: BloggerCourse.comhttps://www.fda.gov/media/152166/download  Fact Sheet for Healthcare Providers: SeriousBroker.ithttps://www.fda.gov/media/152162/download  This test is not yet approved or cleared by the [place] FDA and has been authorized for detection and/or diagnosis of SARS-CoV-2 by FDA under an Emergency Use Authorization (EUA). This EUA will remain in effect (meaning this test can be used) for the duration of the COVID-19 declaration under Section 564(b)(1) of the Act, 21 U.S.C. section 360bbb-3(b)(1), unless the authorization is terminated or revoked.  Performed at [facility], [address] [place], [place] [address]           Radiology Studies: No results found.      Scheduled Meds:  enoxaparin (LOVENOX) injection  0.5 mg/kg Subcutaneous Q24H   methadone  100 mg Oral Daily   mupirocin ointment   Topical BID   prazosin  2 mg Oral QHS   QUEtiapine  100 mg Oral QHS   sodium chloride flush  10-40 mL Intracatheter  Q12H   Continuous Infusions:  sodium chloride 100 mL/hr at 03/05/21 0414   ceFEPime (MAXIPIME) IV 2 g (03/05/21 0516)   vancomycin Stopped (03/05/21 0151)     LOS: 4 days   Time spent: 45min  Azucena FallenWilliam C Roberth Berling, DO [facility]  If 7PM-7AM, please contact night-coverage [email or web address]  03/05/2021, 7:14 AM

## 2021-03-05 NOTE — Progress Notes (Signed)
Patient noted not in room at 2030 and not in hospital. Called Patients boyfriend and I spoke with patient, she states she has removed midline herself and placed in trash , attempted to encourage patient to come back, patient hung phone up. No AMA papers were signed, [facility] aware, hospitalist aware.

## 2021-03-06 LAB — CULTURE, BLOOD (ROUTINE X 2)
Culture: NO GROWTH
Culture: NO GROWTH
Special Requests: ADEQUATE

## 2021-03-14 NOTE — Discharge Summary (Signed)
Physician Discharge Summary  Karen Clarke ZDG:[id number] DOB: October 05, 1985 DOA: 03/01/2021  PCP: Oneita Hurt, No  Admit date: 03/01/2021 Discharge date: 03/05/21  Karen Clarke is a 36 y.o. female with history of IV drug abuse and prior endocarditis who was recently admitted in February 07, 2021 for multiple skin lesions and patient also admitting to having used IV drugs - previous blood cultures came back as Enterococcus faecalis with TEE showing no valvular involvement patient was placed on ampicillin and ceftriaxone with plan for prolonged IV antibiotics.  Unfortunately patient signed out AMA after 3 days on 02/10/2021.  Patient presents back to the ED on 03/01/2021 after injecting cocaine into her arms for 2 days with worsening fevers chills and now deciding to complete her IV antibiotic course.  Hospitalist called for admission.    PATIENT LEFT OVERNIGHT ON 03/05/21 -03/06/21 AGAINST MEDICAL ADVICE AGAIN DESPITE DISCUSSION ABOUT LOSS OF LIFE, LIMB, INCREASED RISK OF MORBIDITY AND MORTALITY. SHE CONTINUES TO BE NON COMPLIANT WITH MEDICATIONS AND CONTINUES ABUSING IV NARCOTICS. IT IS UNCLEAR IF THE PATIENT LEFT WITH IV ACCESS PER INITIAL DOCUMENTATION  Discharge Instructions   Allergies as of 03/05/2021       Reactions   Capsaicin Hives, Itching        Medication List     ASK your doctor about these medications    citalopram 20 MG tablet Commonly known as: CELEXA Take 20 mg by mouth at bedtime.   methadone 10 MG/ML solution Commonly known as: DOLOPHINE Take 100 mg by mouth daily.   prazosin 2 MG capsule Commonly known as: MINIPRESS Take 2 mg by mouth at bedtime.   QUEtiapine 100 MG tablet Commonly known as: SEROQUEL Take 100 mg by mouth at bedtime.        Allergies  Allergen Reactions   Capsaicin Hives and Itching    Procedures/Studies: DG Chest 2 View  Result Date: 02/17/2021 CLINICAL DATA:  Increased weakness and fatigue, history of recently leaving AMA with diagnosis of  bacteremia and endocarditis. EXAM: CHEST - 2 VIEW COMPARISON:  02/06/2021 FINDINGS: The heart size and mediastinal contours are within normal limits. Both lungs are clear. The visualized skeletal structures are unremarkable. IMPRESSION: No active cardiopulmonary disease. Electronically Signed   By: Alcide Clever M.D.   On: 02/17/2021 23:57   DG Chest Port 1 View  Result Date: 03/01/2021 CLINICAL DATA:  Palpitations, recent hospitalization for sepsis EXAM: PORTABLE CHEST 1 VIEW COMPARISON:  02/17/2021 FINDINGS: Cardiac and mediastinal contours are within normal limits. No focal pulmonary opacity. No pleural effusion or pneumothorax. No acute osseous abnormality. IMPRESSION: No acute cardiopulmonary process. Electronically Signed   By: Wiliam Ke M.D.   On: 03/01/2021 19:43      Discharge Exam: Vitals:   03/05/21 0918 03/05/21 1325  BP: 108/61 124/74  Pulse: 65 66  Resp: 18 16  Temp: 97.7 F (36.5 C) 98.2 F (36.8 C)  SpO2: 99% 99%   Vitals:   03/05/21 0037 03/05/21 0451 03/05/21 0918 03/05/21 1325  BP: (!) 97/50 (!) 96/54 108/61 124/74  Pulse: 68 (!) 59 65 66  Resp: 16 16 18 16   Temp: 97.9 F (36.6 C) 98.3 F (36.8 C) 97.7 F (36.5 C) 98.2 F (36.8 C)  TempSrc: Oral Oral Oral Oral  SpO2: 96% 97% 99% 99%  Weight:      Height:        The results of significant diagnostics from this hospitalization (including imaging, microbiology, ancillary and laboratory) are listed below for reference.  Microbiology: No results found for this or any previous visit (from the past 240 hour(s)).   Labs: BNP (last 3 results) No results for input(s): BNP in the last 8760 hours. Basic Metabolic Panel: No results for input(s): NA, K, CL, CO2, GLUCOSE, BUN, CREATININE, CALCIUM, MG, PHOS in the last 168 hours. Liver Function Tests: No results for input(s): AST, ALT, ALKPHOS, BILITOT, PROT, ALBUMIN in the last 168 hours. No results for input(s): LIPASE, AMYLASE in the last 168 hours. No  results for input(s): AMMONIA in the last 168 hours. CBC: No results for input(s): WBC, NEUTROABS, HGB, HCT, MCV, PLT in the last 168 hours. Cardiac Enzymes: No results for input(s): CKTOTAL, CKMB, CKMBINDEX, TROPONINI in the last 168 hours. BNP: Invalid input(s): POCBNP CBG: No results for input(s): GLUCAP in the last 168 hours. D-Dimer No results for input(s): DDIMER in the last 72 hours. Hgb A1c No results for input(s): HGBA1C in the last 72 hours. Lipid Profile No results for input(s): CHOL, HDL, LDLCALC, TRIG, CHOLHDL, LDLDIRECT in the last 72 hours. Thyroid function studies No results for input(s): TSH, T4TOTAL, T3FREE, THYROIDAB in the last 72 hours.  Invalid input(s): FREET3 Anemia work up No results for input(s): VITAMINB12, FOLATE, FERRITIN, TIBC, IRON, RETICCTPCT in the last 72 hours. Urinalysis    Component Value Date/Time   COLORURINE YELLOW (A) 03/01/2021 1806   APPEARANCEUR HAZY (A) 03/01/2021 1806   LABSPEC 1.020 03/01/2021 1806   PHURINE 6.0 03/01/2021 1806   GLUCOSEU NEGATIVE 03/01/2021 1806   HGBUR NEGATIVE 03/01/2021 1806   BILIRUBINUR NEGATIVE 03/01/2021 1806   KETONESUR NEGATIVE 03/01/2021 1806   PROTEINUR NEGATIVE 03/01/2021 1806   UROBILINOGEN 0.2 02/21/2009 1848   NITRITE NEGATIVE 03/01/2021 1806   LEUKOCYTESUR TRACE (A) 03/01/2021 1806   Sepsis Labs Invalid input(s): PROCALCITONIN,  WBC,  LACTICIDVEN Microbiology No results found for this or any previous visit (from the past 240 hour(s)).   Time coordinating discharge: Over 30 minutes  SIGNED:   Azucena Fallen, DO [facility] 03/14/2021, 8:25 AM Pager   If 7PM-7AM, please contact night-coverage [email or web address]

## 2021-04-01 ENCOUNTER — Encounter: Payer: Self-pay | Admitting: Emergency Medicine

## 2021-04-01 ENCOUNTER — Other Ambulatory Visit: Payer: Self-pay

## 2021-04-01 ENCOUNTER — Emergency Department
Admission: EM | Admit: 2021-04-01 | Discharge: 2021-04-01 | Disposition: A | Discharge status: Home / Self Care | Payer: BLUE CROSS/BLUE SHIELD | Source: Home / Self Care | Attending: Emergency Medicine | Admitting: Emergency Medicine

## 2021-04-01 DIAGNOSIS — Z5321 Procedure and treatment not carried out due to patient leaving prior to being seen by health care provider: Secondary | ICD-10-CM | POA: Insufficient documentation

## 2021-04-01 DIAGNOSIS — M791 Myalgia, unspecified site: Secondary | ICD-10-CM | POA: Insufficient documentation

## 2021-04-01 DIAGNOSIS — R509 Fever, unspecified: Secondary | ICD-10-CM | POA: Insufficient documentation

## 2021-04-01 DIAGNOSIS — L0291 Cutaneous abscess, unspecified: Secondary | ICD-10-CM | POA: Insufficient documentation

## 2021-04-01 DIAGNOSIS — N611 Abscess of the breast and nipple: Secondary | ICD-10-CM | POA: Diagnosis not present

## 2021-04-01 LAB — CBC WITH DIFFERENTIAL/PLATELET
Abs Immature Granulocytes: 0.05 10*3/uL (ref 0.00–0.07)
Basophils Absolute: 0 10*3/uL (ref 0.0–0.1)
Basophils Relative: 0 %
Eosinophils Absolute: 0.3 10*3/uL (ref 0.0–0.5)
Eosinophils Relative: 3 %
HCT: 32.3 % — ABNORMAL LOW (ref 36.0–46.0)
Hemoglobin: 9.5 g/dL — ABNORMAL LOW (ref 12.0–15.0)
Immature Granulocytes: 1 %
Lymphocytes Relative: 26 %
Lymphs Abs: 2.8 10*3/uL (ref 0.7–4.0)
MCH: 23.3 pg — ABNORMAL LOW (ref 26.0–34.0)
MCHC: 29.4 g/dL — ABNORMAL LOW (ref 30.0–36.0)
MCV: 79.2 fL — ABNORMAL LOW (ref 80.0–100.0)
Monocytes Absolute: 0.5 10*3/uL (ref 0.1–1.0)
Monocytes Relative: 4 %
Neutro Abs: 7.2 10*3/uL (ref 1.7–7.7)
Neutrophils Relative %: 66 %
Platelets: 445 10*3/uL — ABNORMAL HIGH (ref 150–400)
RBC: 4.08 MIL/uL (ref 3.87–5.11)
RDW: 15.1 % (ref 11.5–15.5)
WBC: 10.9 10*3/uL — ABNORMAL HIGH (ref 4.0–10.5)
nRBC: 0 % (ref 0.0–0.2)

## 2021-04-01 LAB — COMPREHENSIVE METABOLIC PANEL
ALT: 24 U/L (ref 0–44)
AST: 24 U/L (ref 15–41)
Albumin: 3.7 g/dL (ref 3.5–5.0)
Alkaline Phosphatase: 217 U/L — ABNORMAL HIGH (ref 38–126)
Anion gap: 12 (ref 5–15)
BUN: 12 mg/dL (ref 6–20)
CO2: 23 mmol/L (ref 22–32)
Calcium: 9 mg/dL (ref 8.9–10.3)
Chloride: 100 mmol/L (ref 98–111)
Creatinine, Ser: 0.79 mg/dL (ref 0.44–1.00)
GFR, Estimated: >60 mL/min (ref >60)
Glucose, Bld: 112 mg/dL — ABNORMAL HIGH (ref 70–99)
Potassium: 3.9 mmol/L (ref 3.5–5.1)
Sodium: 135 mmol/L (ref 135–145)
Total Bilirubin: <0.1 mg/dL — ABNORMAL LOW (ref 0.3–1.2)
Total Protein: 7.7 g/dL (ref 6.5–8.1)

## 2021-04-01 LAB — URINALYSIS, ROUTINE W REFLEX MICROSCOPIC
Bilirubin Urine: NEGATIVE
Glucose, UA: NEGATIVE mg/dL
Ketones, ur: NEGATIVE mg/dL
Nitrite: NEGATIVE
Protein, ur: NEGATIVE mg/dL
Specific Gravity, Urine: 1.02 (ref 1.005–1.030)
pH: 5 (ref 5.0–8.0)

## 2021-04-01 LAB — LACTIC ACID, PLASMA: Lactic Acid, Venous: 1.8 mmol/L (ref 0.5–1.9)

## 2021-04-01 LAB — URINALYSIS, MICROSCOPIC (REFLEX)

## 2021-04-01 NOTE — ED Triage Notes (Signed)
Pt called from [facility] to triage room, no response. RN looked outside as well, no response

## 2021-04-01 NOTE — ED Notes (Signed)
Patient left to go to the bathroom at approximately 1620. Patient went to the bathroom across from subwait. Patient was given a new pair of briefs. Patient did not come back to the room for awhile. Registration in the lobby states patient came out to the lobby to be let back into flex to collect her belongings.Belongings are no longer in the room. Dr. Kerman Passey has not seen the patient.

## 2021-04-01 NOTE — ED Triage Notes (Signed)
Pt called from [facility] to treatment room, no response 

## 2021-04-01 NOTE — ED Triage Notes (Signed)
Pt to ED via POV stating that she thinks that she has a blood infection. Pt states that she has abscesses all over her body, she is having fever, chills, and body aches. Pt states that she last took something for fever about 30 minutes PTA. Pt states that she has hx/o endocarditis x 2. Pt states that she was admitted about 3 or 4 weeks ago for blood infection. Pt is currently in NAD.

## 2021-04-02 ENCOUNTER — Other Ambulatory Visit: Payer: Self-pay

## 2021-04-02 ENCOUNTER — Encounter: Payer: Self-pay | Admitting: Emergency Medicine

## 2021-04-02 DIAGNOSIS — M329 Systemic lupus erythematosus, unspecified: Secondary | ICD-10-CM | POA: Diagnosis present

## 2021-04-02 DIAGNOSIS — L02415 Cutaneous abscess of right lower limb: Secondary | ICD-10-CM | POA: Diagnosis present

## 2021-04-02 DIAGNOSIS — Z20822 Contact with and (suspected) exposure to covid-19: Secondary | ICD-10-CM | POA: Diagnosis present

## 2021-04-02 DIAGNOSIS — F1110 Opioid abuse, uncomplicated: Secondary | ICD-10-CM | POA: Diagnosis present

## 2021-04-02 DIAGNOSIS — L02416 Cutaneous abscess of left lower limb: Secondary | ICD-10-CM | POA: Diagnosis present

## 2021-04-02 DIAGNOSIS — L03115 Cellulitis of right lower limb: Secondary | ICD-10-CM | POA: Diagnosis present

## 2021-04-02 DIAGNOSIS — E669 Obesity, unspecified: Secondary | ICD-10-CM | POA: Diagnosis present

## 2021-04-02 DIAGNOSIS — N611 Abscess of the breast and nipple: Principal | ICD-10-CM | POA: Diagnosis present

## 2021-04-02 DIAGNOSIS — D509 Iron deficiency anemia, unspecified: Secondary | ICD-10-CM | POA: Diagnosis present

## 2021-04-02 DIAGNOSIS — D75838 Other thrombocytosis: Secondary | ICD-10-CM | POA: Diagnosis present

## 2021-04-02 DIAGNOSIS — L03116 Cellulitis of left lower limb: Secondary | ICD-10-CM | POA: Diagnosis present

## 2021-04-02 DIAGNOSIS — F1410 Cocaine abuse, uncomplicated: Secondary | ICD-10-CM | POA: Diagnosis present

## 2021-04-02 DIAGNOSIS — D802 Selective deficiency of immunoglobulin A [IgA]: Secondary | ICD-10-CM | POA: Diagnosis present

## 2021-04-02 DIAGNOSIS — F17210 Nicotine dependence, cigarettes, uncomplicated: Secondary | ICD-10-CM | POA: Diagnosis present

## 2021-04-02 DIAGNOSIS — N61 Mastitis without abscess: Secondary | ICD-10-CM | POA: Diagnosis present

## 2021-04-02 DIAGNOSIS — F32A Depression, unspecified: Secondary | ICD-10-CM | POA: Diagnosis present

## 2021-04-02 DIAGNOSIS — Z6839 Body mass index (BMI) 39.0-39.9, adult: Secondary | ICD-10-CM

## 2021-04-02 DIAGNOSIS — G629 Polyneuropathy, unspecified: Secondary | ICD-10-CM | POA: Diagnosis present

## 2021-04-02 LAB — CBC WITH DIFFERENTIAL/PLATELET
Abs Immature Granulocytes: 0.09 10*3/uL — ABNORMAL HIGH (ref 0.00–0.07)
Basophils Absolute: 0.1 10*3/uL (ref 0.0–0.1)
Basophils Relative: 0 %
Eosinophils Absolute: 0.2 10*3/uL (ref 0.0–0.5)
Eosinophils Relative: 1 %
HCT: 33.9 % — ABNORMAL LOW (ref 36.0–46.0)
Hemoglobin: 10.3 g/dL — ABNORMAL LOW (ref 12.0–15.0)
Immature Granulocytes: 1 %
Lymphocytes Relative: 28 %
Lymphs Abs: 3.4 10*3/uL (ref 0.7–4.0)
MCH: 23.1 pg — ABNORMAL LOW (ref 26.0–34.0)
MCHC: 30.4 g/dL (ref 30.0–36.0)
MCV: 76.2 fL — ABNORMAL LOW (ref 80.0–100.0)
Monocytes Absolute: 0.6 10*3/uL (ref 0.1–1.0)
Monocytes Relative: 5 %
Neutro Abs: 8 10*3/uL — ABNORMAL HIGH (ref 1.7–7.7)
Neutrophils Relative %: 65 %
Platelets: 404 10*3/uL — ABNORMAL HIGH (ref 150–400)
RBC: 4.45 MIL/uL (ref 3.87–5.11)
RDW: 15.4 % (ref 11.5–15.5)
WBC: 12.3 10*3/uL — ABNORMAL HIGH (ref 4.0–10.5)
nRBC: 0 % (ref 0.0–0.2)

## 2021-04-02 LAB — BASIC METABOLIC PANEL
Anion gap: 10 (ref 5–15)
BUN: 14 mg/dL (ref 6–20)
CO2: 23 mmol/L (ref 22–32)
Calcium: 8.6 mg/dL — ABNORMAL LOW (ref 8.9–10.3)
Chloride: 103 mmol/L (ref 98–111)
Creatinine, Ser: 0.8 mg/dL (ref 0.44–1.00)
GFR, Estimated: >60 mL/min (ref >60)
Glucose, Bld: 90 mg/dL (ref 70–99)
Potassium: 4.6 mmol/L (ref 3.5–5.1)
Sodium: 136 mmol/L (ref 135–145)

## 2021-04-02 LAB — LACTIC ACID, PLASMA: Lactic Acid, Venous: 1.9 mmol/L (ref 0.5–1.9)

## 2021-04-02 NOTE — ED Triage Notes (Signed)
Pt presents via POV with c/o absess and wounds to bilateral breast, left leg, and left arm. Pt denies taking any recent antibiotics for wounds. Pt c/o generalized body aches and 10/10 pain.

## 2021-04-03 ENCOUNTER — Inpatient Hospital Stay
Admission: EM | Admit: 2021-04-03 | Discharge: 2021-04-06 | DRG: 600 | Disposition: A | Discharge status: Home / Self Care | Payer: BLUE CROSS/BLUE SHIELD | Attending: Internal Medicine | Admitting: Internal Medicine

## 2021-04-03 ENCOUNTER — Encounter (HOSPITAL_COMMUNITY): Payer: Self-pay | Admitting: Certified Registered Nurse Anesthetist

## 2021-04-03 DIAGNOSIS — F1110 Opioid abuse, uncomplicated: Secondary | ICD-10-CM | POA: Diagnosis present

## 2021-04-03 DIAGNOSIS — N611 Abscess of the breast and nipple: Secondary | ICD-10-CM | POA: Diagnosis present

## 2021-04-03 DIAGNOSIS — E669 Obesity, unspecified: Secondary | ICD-10-CM | POA: Diagnosis present

## 2021-04-03 DIAGNOSIS — M329 Systemic lupus erythematosus, unspecified: Secondary | ICD-10-CM | POA: Diagnosis present

## 2021-04-03 DIAGNOSIS — F1990 Other psychoactive substance use, unspecified, uncomplicated: Secondary | ICD-10-CM

## 2021-04-03 DIAGNOSIS — F17210 Nicotine dependence, cigarettes, uncomplicated: Secondary | ICD-10-CM | POA: Diagnosis present

## 2021-04-03 DIAGNOSIS — F1410 Cocaine abuse, uncomplicated: Secondary | ICD-10-CM

## 2021-04-03 DIAGNOSIS — D75839 Thrombocytosis, unspecified: Secondary | ICD-10-CM

## 2021-04-03 DIAGNOSIS — L0390 Cellulitis, unspecified: Secondary | ICD-10-CM

## 2021-04-03 DIAGNOSIS — D75838 Other thrombocytosis: Secondary | ICD-10-CM | POA: Diagnosis present

## 2021-04-03 DIAGNOSIS — G629 Polyneuropathy, unspecified: Secondary | ICD-10-CM

## 2021-04-03 DIAGNOSIS — N61 Mastitis without abscess: Secondary | ICD-10-CM | POA: Diagnosis present

## 2021-04-03 DIAGNOSIS — L0291 Cutaneous abscess, unspecified: Secondary | ICD-10-CM

## 2021-04-03 DIAGNOSIS — L02415 Cutaneous abscess of right lower limb: Secondary | ICD-10-CM | POA: Diagnosis present

## 2021-04-03 DIAGNOSIS — L02416 Cutaneous abscess of left lower limb: Secondary | ICD-10-CM | POA: Diagnosis present

## 2021-04-03 DIAGNOSIS — L03115 Cellulitis of right lower limb: Secondary | ICD-10-CM | POA: Diagnosis present

## 2021-04-03 DIAGNOSIS — Z20822 Contact with and (suspected) exposure to covid-19: Secondary | ICD-10-CM | POA: Diagnosis present

## 2021-04-03 DIAGNOSIS — D509 Iron deficiency anemia, unspecified: Secondary | ICD-10-CM

## 2021-04-03 DIAGNOSIS — D802 Selective deficiency of immunoglobulin A [IgA]: Secondary | ICD-10-CM | POA: Diagnosis present

## 2021-04-03 DIAGNOSIS — L03116 Cellulitis of left lower limb: Secondary | ICD-10-CM | POA: Diagnosis present

## 2021-04-03 DIAGNOSIS — Z6839 Body mass index (BMI) 39.0-39.9, adult: Secondary | ICD-10-CM | POA: Diagnosis not present

## 2021-04-03 DIAGNOSIS — F32A Depression, unspecified: Secondary | ICD-10-CM | POA: Diagnosis present

## 2021-04-03 LAB — COMPREHENSIVE METABOLIC PANEL
ALT: 18 U/L (ref 0–44)
AST: 24 U/L (ref 15–41)
Albumin: 2.9 g/dL — ABNORMAL LOW (ref 3.5–5.0)
Alkaline Phosphatase: 170 U/L — ABNORMAL HIGH (ref 38–126)
Anion gap: 9 (ref 5–15)
BUN: 14 mg/dL (ref 6–20)
CO2: 23 mmol/L (ref 22–32)
Calcium: 8.4 mg/dL — ABNORMAL LOW (ref 8.9–10.3)
Chloride: 105 mmol/L (ref 98–111)
Creatinine, Ser: 0.8 mg/dL (ref 0.44–1.00)
GFR, Estimated: >60 mL/min (ref >60)
Glucose, Bld: 125 mg/dL — ABNORMAL HIGH (ref 70–99)
Potassium: 3.8 mmol/L (ref 3.5–5.1)
Sodium: 137 mmol/L (ref 135–145)
Total Bilirubin: 0.2 mg/dL — ABNORMAL LOW (ref 0.3–1.2)
Total Protein: 6.7 g/dL (ref 6.5–8.1)

## 2021-04-03 LAB — URINALYSIS, ROUTINE W REFLEX MICROSCOPIC
Bilirubin Urine: NEGATIVE
Glucose, UA: NEGATIVE mg/dL
Hgb urine dipstick: NEGATIVE
Ketones, ur: NEGATIVE mg/dL
Leukocytes,Ua: NEGATIVE
Nitrite: NEGATIVE
Protein, ur: NEGATIVE mg/dL
Specific Gravity, Urine: 1.025 (ref 1.005–1.030)
pH: 5.5 (ref 5.0–8.0)

## 2021-04-03 LAB — URINE DRUG SCREEN, QUALITATIVE (ARMC ONLY)
Amphetamines, Ur Screen: NOT DETECTED
Barbiturates, Ur Screen: NOT DETECTED
Benzodiazepine, Ur Scrn: NOT DETECTED
Cannabinoid 50 Ng, Ur ~~LOC~~: NOT DETECTED
Cocaine Metabolite,Ur ~~LOC~~: POSITIVE — AB
MDMA (Ecstasy)Ur Screen: NOT DETECTED
Methadone Scn, Ur: POSITIVE — AB
Opiate, Ur Screen: NOT DETECTED
Phencyclidine (PCP) Ur S: NOT DETECTED
Tricyclic, Ur Screen: NOT DETECTED

## 2021-04-03 LAB — CBC WITH DIFFERENTIAL/PLATELET
Abs Immature Granulocytes: 0.06 10*3/uL (ref 0.00–0.07)
Basophils Absolute: 0.1 10*3/uL (ref 0.0–0.1)
Basophils Relative: 1 %
Eosinophils Absolute: 0.3 10*3/uL (ref 0.0–0.5)
Eosinophils Relative: 4 %
HCT: 30.2 % — ABNORMAL LOW (ref 36.0–46.0)
Hemoglobin: 9.1 g/dL — ABNORMAL LOW (ref 12.0–15.0)
Immature Granulocytes: 1 %
Lymphocytes Relative: 25 %
Lymphs Abs: 1.8 10*3/uL (ref 0.7–4.0)
MCH: 23.5 pg — ABNORMAL LOW (ref 26.0–34.0)
MCHC: 30.1 g/dL (ref 30.0–36.0)
MCV: 77.8 fL — ABNORMAL LOW (ref 80.0–100.0)
Monocytes Absolute: 0.3 10*3/uL (ref 0.1–1.0)
Monocytes Relative: 4 %
Neutro Abs: 4.7 10*3/uL (ref 1.7–7.7)
Neutrophils Relative %: 65 %
Platelets: 388 10*3/uL (ref 150–400)
RBC: 3.88 MIL/uL (ref 3.87–5.11)
RDW: 15.3 % (ref 11.5–15.5)
WBC: 7.2 10*3/uL (ref 4.0–10.5)
nRBC: 0.3 % — ABNORMAL HIGH (ref 0.0–0.2)

## 2021-04-03 LAB — MRSA NEXT GEN BY PCR, NASAL: MRSA by PCR Next Gen: DETECTED — AB

## 2021-04-03 LAB — RESP PANEL BY RT-PCR (FLU A&B, COVID) ARPGX2
Influenza A by PCR: NEGATIVE
Influenza B by PCR: NEGATIVE
SARS Coronavirus 2 by RT PCR: NEGATIVE

## 2021-04-03 LAB — POC URINE PREG, ED: Preg Test, Ur: NEGATIVE

## 2021-04-03 LAB — MAGNESIUM: Magnesium: 2 mg/dL (ref 1.7–2.4)

## 2021-04-03 LAB — PROCALCITONIN: Procalcitonin: 0.14 ng/mL

## 2021-04-03 LAB — PHOSPHORUS: Phosphorus: 3.5 mg/dL (ref 2.5–4.6)

## 2021-04-03 MED ORDER — VANCOMYCIN HCL 2000 MG/400ML IV SOLN
2000.0000 mg | Freq: Once | INTRAVENOUS | Status: AC
  Administered 2021-04-03: 2000 mg via INTRAVENOUS
  Filled 2021-04-03: qty 400

## 2021-04-03 MED ORDER — ONDANSETRON HCL 4 MG PO TABS: 4.0000 mg | ORAL_TABLET | Freq: Four times a day (QID) | ORAL | Status: DC | PRN

## 2021-04-03 MED ORDER — SODIUM CHLORIDE 0.9 % IV SOLN: INTRAVENOUS | Status: DC

## 2021-04-03 MED ORDER — ONDANSETRON HCL 4 MG/2ML IJ SOLN
4.0000 mg | INTRAMUSCULAR | Status: AC
  Administered 2021-04-03: 4 mg via INTRAVENOUS
  Filled 2021-04-03: qty 2

## 2021-04-03 MED ORDER — VANCOMYCIN HCL 1750 MG/350ML IV SOLN
1750.0000 mg | INTRAVENOUS | Status: DC
  Administered 2021-04-04: 1750 mg via INTRAVENOUS
  Filled 2021-04-03 (×2): qty 350

## 2021-04-03 MED ORDER — ACETAMINOPHEN 325 MG PO TABS
650.0000 mg | ORAL_TABLET | Freq: Four times a day (QID) | ORAL | Status: DC | PRN
  Administered 2021-04-03 – 2021-04-04 (×2): 650 mg via ORAL
  Filled 2021-04-03 (×2): qty 2

## 2021-04-03 MED ORDER — HYDROCODONE-ACETAMINOPHEN 5-325 MG PO TABS
1.0000 | ORAL_TABLET | ORAL | Status: DC | PRN
  Administered 2021-04-03: 2 via ORAL
  Administered 2021-04-03: 1 via ORAL
  Administered 2021-04-04 – 2021-04-06 (×8): 2 via ORAL
  Filled 2021-04-03 (×2): qty 2
  Filled 2021-04-03: qty 1
  Filled 2021-04-03 (×7): qty 2

## 2021-04-03 MED ORDER — MAGNESIUM HYDROXIDE 400 MG/5ML PO SUSP: 30.0000 mL | Freq: Every day | ORAL | Status: DC | PRN

## 2021-04-03 MED ORDER — ACETAMINOPHEN 500 MG PO TABS
1000.0000 mg | ORAL_TABLET | Freq: Once | ORAL | Status: AC
  Administered 2021-04-03: 1000 mg via ORAL
  Filled 2021-04-03: qty 2

## 2021-04-03 MED ORDER — MORPHINE SULFATE (PF) 4 MG/ML IV SOLN
4.0000 mg | Freq: Once | INTRAVENOUS | Status: AC
Start: 2021-04-03 — End: 2021-04-03
  Administered 2021-04-03: 4 mg via INTRAVENOUS
  Filled 2021-04-03: qty 1

## 2021-04-03 MED ORDER — PRAZOSIN HCL 2 MG PO CAPS
2.0000 mg | ORAL_CAPSULE | Freq: Every day | ORAL | Status: DC
  Administered 2021-04-03: 2 mg via ORAL
  Filled 2021-04-03: qty 1

## 2021-04-03 MED ORDER — ACETAMINOPHEN 650 MG RE SUPP: 650.0000 mg | Freq: Four times a day (QID) | RECTAL | Status: DC | PRN

## 2021-04-03 MED ORDER — ENOXAPARIN SODIUM 60 MG/0.6ML IJ SOSY
0.5000 mg/kg | PREFILLED_SYRINGE | INTRAMUSCULAR | Status: AC
  Administered 2021-04-03: 13:00:00 47.5 mg via SUBCUTANEOUS
  Filled 2021-04-03 (×3): qty 0.6

## 2021-04-03 MED ORDER — NICOTINE 14 MG/24HR TD PT24
14.0000 mg | MEDICATED_PATCH | Freq: Every day | TRANSDERMAL | Status: DC
  Administered 2021-04-03 – 2021-04-06 (×4): 14 mg via TRANSDERMAL
  Filled 2021-04-03 (×4): qty 1

## 2021-04-03 MED ORDER — METHADONE HCL 10 MG/ML PO CONC
100.0000 mg | Freq: Every morning | ORAL | Status: DC
  Administered 2021-04-03 – 2021-04-06 (×4): 100 mg via ORAL
  Filled 2021-04-03 (×4): qty 10

## 2021-04-03 MED ORDER — VANCOMYCIN HCL IN DEXTROSE 1-5 GM/200ML-% IV SOLN: 1000.0000 mg | Freq: Once | INTRAVENOUS | Status: DC

## 2021-04-03 MED ORDER — QUETIAPINE FUMARATE 25 MG PO TABS
100.0000 mg | ORAL_TABLET | Freq: Every day | ORAL | Status: DC
  Administered 2021-04-03 – 2021-04-05 (×3): 100 mg via ORAL
  Filled 2021-04-03 (×3): qty 4

## 2021-04-03 MED ORDER — ONDANSETRON HCL 4 MG/2ML IJ SOLN: 4.0000 mg | Freq: Four times a day (QID) | INTRAMUSCULAR | Status: DC | PRN

## 2021-04-03 MED ORDER — TRAZODONE HCL 50 MG PO TABS: 25.0000 mg | ORAL_TABLET | Freq: Every evening | ORAL | Status: DC | PRN

## 2021-04-03 MED ORDER — SODIUM CHLORIDE 0.9 % IV SOLN
2.0000 g | INTRAVENOUS | Status: AC
  Administered 2021-04-03: 2 g via INTRAVENOUS
  Filled 2021-04-03: qty 20

## 2021-04-03 MED ORDER — KETOROLAC TROMETHAMINE 30 MG/ML IJ SOLN
15.0000 mg | Freq: Once | INTRAMUSCULAR | Status: AC
  Administered 2021-04-03: 15 mg via INTRAVENOUS
  Filled 2021-04-03: qty 1

## 2021-04-03 NOTE — ED Notes (Signed)
Unable to obtain IV x 2 attempts.

## 2021-04-03 NOTE — ED Notes (Signed)
Patient instructed that she is not to leave department with IV in. Patient instructed that if she needs to leave she must have IV removed prior to departure. Patient verbalized understanding.

## 2021-04-03 NOTE — ED Notes (Signed)
RN called lab and requested phlebotomy to draw second set of blood cultures. Lab states phlebotomist will come to draw labs.

## 2021-04-03 NOTE — ED Notes (Addendum)
Per report from [facility]  pt will not have OR for  Rt breast Abscess on 2/3 due to cocaine use.   Plan for repeat UDS @7AM  04/06/21. NPO on 04/06/21 1200H  #3 attempts from lab to draw blood for this Pt

## 2021-04-03 NOTE — ED Notes (Signed)
PATIENT REFUSED COVID SWAB

## 2021-04-03 NOTE — ED Provider Notes (Signed)
[facility] Provider Note    Event Date/Time   First MD Initiated Contact with Patient 04/03/21 0330     (approximate)   History   Abscess   HPI  Karen Clarke is a 36 y.o. female whose medical history includes polysubstance abuse, ongoing IV drug use, history of bacterial endocarditis, cellulitis, bacteremia, and prior sepsis.  She presents tonight for 3 to 4 days of worsening pain, redness, and drainage from numerous wounds on her arms and her chest.  She has a chronic wound on her left lower extremity that seems to be getting worse.  She says she only injects IV cocaine into her arms but she has broken out and multiple areas of infection on her chest and most notably on her right breast.  She is not aware of any recent fever.  She is reporting severe chest pain but from the wounds on her chest.  She denies shortness of breath.  She last injected IV drugs day before yesterday.  She feels ill all over.       Physical Exam   Triage Vital Signs: ED Triage Vitals  Enc Vitals Group     BP 04/02/21 2227 129/82     Pulse Rate 04/02/21 2227 98     Resp 04/02/21 2227 17     Temp 04/02/21 2227 99 F (37.2 C)     Temp Source 04/02/21 2227 Oral     SpO2 04/02/21 2227 99 %     Weight 04/02/21 2228 95.3 kg (210 lb)     Height --      Head Circumference --      Peak Flow --      Pain Score 04/02/21 2228 9     Pain Loc --      Pain Edu? --      Excl. in GC? --     Most recent vital signs: Vitals:   04/02/21 2227 04/03/21 0153  BP: 129/82 129/71  Pulse: 98 76  Resp: 17 16  Temp: 99 F (37.2 C)   SpO2: 99% 97%     General: Awake, no distress.  Using her phone in no apparent distress. CV:  Good peripheral perfusion.  Resp:  Normal effort.  No accessory muscle usage. Abd:  No distention.  Neuro:  Slight tremor of both of her hands.  No focal neurological deficits. Skin:  The patient has numerous and uncountable cutaneous lesions on her arms as well as on  her chest wall.  She has an open draining lesion on her right breast with a very small amount of surrounding induration but no fluctuance.  She has extensive right breast cellulitis as well as patchy areas of cellulitis on her arms.  She has a very large chronic appearing wound on her left anterior lower leg.  In spite of the extensive and widespread areas of infection, there is no evidence of necrotizing fasciitis.    ED Results / Procedures / Treatments   Labs (all labs ordered are listed, but only abnormal results are displayed) Labs Reviewed  CBC WITH DIFFERENTIAL/PLATELET - Abnormal; Notable for the following components:      Result Value   WBC 12.3 (*)    Hemoglobin 10.3 (*)    HCT 33.9 (*)    MCV 76.2 (*)    MCH 23.1 (*)    Platelets 404 (*)    Neutro Abs 8.0 (*)    Abs Immature Granulocytes 0.09 (*)    All other components  within normal limits  BASIC METABOLIC PANEL - Abnormal; Notable for the following components:   Calcium 8.6 (*)    All other components within normal limits  CULTURE, BLOOD (ROUTINE X 2)  RESP PANEL BY RT-PCR (FLU A&B, COVID) ARPGX2  CULTURE, BLOOD (ROUTINE X 2)  CULTURE, BLOOD (ROUTINE X 2)  LACTIC ACID, PLASMA  PROCALCITONIN  URINALYSIS, ROUTINE W REFLEX MICROSCOPIC  POC URINE PREG, ED    PROCEDURES:  Critical Care performed: No  Procedures   MEDICATIONS ORDERED IN ED: Medications  morphine 4 MG/ML injection 4 mg (has no administration in time range)  ondansetron (ZOFRAN) injection 4 mg (has no administration in time range)  cefTRIAXone (ROCEPHIN) 2 g in sodium chloride 0.9 % 100 mL IVPB (has no administration in time range)  ketorolac (TORADOL) 30 MG/ML injection 15 mg (has no administration in time range)  acetaminophen (TYLENOL) tablet 1,000 mg (has no administration in time range)  vancomycin (VANCOREADY) IVPB 2000 mg/400 mL (has no administration in time range)     IMPRESSION / MDM / ASSESSMENT AND PLAN / ED COURSE  I reviewed the  triage vital signs and the nursing notes.                              Differential diagnosis includes, but is not limited to, cellulitis, abscesses, bacteremia, endocarditis.  I am concerned that the patient may have endocarditis again, or at least bacteremia.  Her vital signs are reassuring with no tachycardia, but the presence of so many cutaneous lesions on areas far from her injection sites, including on her chest wall with extensive breast cellulitis, is very concerning for systemic infection.  Labs ordered in triage include CBC with differential, basic metabolic panel, lactic acid, procalcitonin.  I also ordered blood cultures x2.  I reviewed her results and she has a leukocytosis of 12.3, a lactic acid at the upper limit of normal of 1.9, and procalcitonin that is generally reassuring at 0.14.  Her basic metabolic panel is within normal limits.  However, her overall general presentation is of great concern with open wounds that are leaking pus and extensive cellulitis that has apparently rapidly worsened over the last 3 to 4 days.  Unfortunately the patient became hostile and verbally abusive during my exam because she did not want me to palpate the wounds on her right breast to try to determine the extent of any existing fluctuance (nurse Fleet ContrasRachel was present as chaperone during the interaction).  I tried to explain that I had just tried to assess the extent of the wounds and whether or not she would benefit from an ultrasound but she began screaming at me, telling me to stop, and cursing at me, stating, "I don't need you to push on my open wounds, I just need some fucking antibiotics!"  I remove myself from the situation at that point as it was no longer therapeutic for either party.  Normally I would obtain images to put in the chart for documentation purposes but I will defer to the admitting service if they so desire as I will not subject myself to additional verbal abuse in order to obtain  the photographs.  However, the patient does need admission for IV antibiotics while the blood cultures are growing.  I ordered ceftriaxone 2 g IV and vancomycin per pharmacy consult.  Blood cultures are pending.  Given the extent of her infection and undoubtedly severe pain, I ordered morphine  4 mg IV, Toradol 15 mg IV, and 1 g of Tylenol by mouth.  I consulted the hospitalist service for admission.       Clinical Course as of 04/03/21 0356  Tue Apr 03, 2021  0355 Discussed case by phone with Dr. Arville Care with the hospitalist service.  He agreed to the consult and will admit the patient for further management. [CF]    Clinical Course User Index [CF] Loleta Rose, MD     FINAL CLINICAL IMPRESSION(S) / ED DIAGNOSES   Final diagnoses:  Cutaneous abscess, unspecified site  Cellulitis, unspecified cellulitis site  IV drug user     Rx / DC Orders   ED Discharge Orders     None        Note:  This document was prepared using Dragon voice recognition software and may include unintentional dictation errors.   Loleta Rose, MD 04/03/21 [phone]

## 2021-04-03 NOTE — H&P (Signed)
[place]   PATIENT NAME: Karen Clarke    MR#:  [id number]  DATE OF BIRTH:  10/24/1985  DATE OF ADMISSION:  04/03/2021  PRIMARY CARE PHYSICIAN: Pcp, No   Patient is coming from: Home  REQUESTING/REFERRING PHYSICIAN: Loleta Rose, MD  CHIEF COMPLAINT:   Chief Complaint  Patient presents with   Abscess    HISTORY OF PRESENT ILLNESS:  Karen Clarke is a 36 y.o. Caucasian female with medical history significant for IgA deficiency, SLE, IVH cocaine abuse and multiple skin abscesses, who presented to the emergency room with acute onset of worsening and newly developed skin abscesses involving his right breast as well as left breast, left arm and right elbow in addition to to her legs.  She denied any fever or chills.  No nausea or vomiting or abdominal pain.  No chest pain or cough.  She is currently going to outpatient rehabilitation.  She smokes about 4 cigarettes/day.  She takes methadone for previous history of heroin abuse.  ED Course: When she came to the ER temperature was 99 and vital signs were otherwise within normal.  Labs revealed a calcium of 8.6 with unremarkable BMP.  CBC showed leukocytosis of 12.3 with neutrophilia and anemia close to baseline.  UA was negative.  2 days ago her UA was positive for UTI.  Blood cultures were sent  The patient was given 15 mg IV Toradol, 1 g of p.o. Tylenol, 4 mg of IV morphine sulfate, 4 mg of IV Zofran and IV ceftriaxone and vancomycin.  She will be admitted to a medical bed for further evaluation and management. PAST MEDICAL HISTORY:   Past Medical History:  Diagnosis Date   IgA deficiency (HCC)    Lupus (HCC)     PAST SURGICAL HISTORY:   Past Surgical History:  Procedure Laterality Date   TEE WITHOUT CARDIOVERSION N/A 02/09/2021   Procedure: TRANSESOPHAGEAL ECHOCARDIOGRAM (TEE);  Surgeon: Antonieta Iba, MD;  Location: [facility] ORS;  Service: Cardiovascular;  Laterality: N/A;   TONSILLECTOMY      SOCIAL HISTORY:    Social History   Tobacco Use   Smoking status: Every Day    Types: Cigarettes   Smokeless tobacco: Never  Substance Use Topics   Alcohol use: Never    FAMILY HISTORY:  History reviewed. No pertinent family history.  DRUG ALLERGIES:   Allergies  Allergen Reactions   Capsaicin Hives and Itching   Cefazolin Rash    Tolerated cefepime 02/20/20 Negative penicillin skin test 12/27/2020    REVIEW OF SYSTEMS:   ROS As per history of present illness. All pertinent systems were reviewed above. Constitutional, HEENT, cardiovascular, respiratory, GI, GU, musculoskeletal, neuro, psychiatric, endocrine, integumentary and hematologic systems were reviewed and are otherwise negative/unremarkable except for positive findings mentioned above in the HPI.   MEDICATIONS AT HOME:   Prior to Admission medications   Medication Sig Start Date End Date Taking? Authorizing Provider  methadone (DOLOPHINE) 10 MG/ML solution Take 100 mg by mouth daily.   Yes [provider]  prazosin (MINIPRESS) 2 MG capsule Take 2 mg by mouth at bedtime. 12/13/20  Yes [provider]  QUEtiapine (SEROQUEL) 100 MG tablet Take 100 mg by mouth at bedtime. 12/30/20  Yes [provider]  citalopram (CELEXA) 20 MG tablet Take 20 mg by mouth at bedtime. Patient not taking: Reported on 02/06/2021 10/03/20   [provider]      VITAL SIGNS:  Blood pressure (!) 110/58, pulse 77, temperature  99 F (37.2 C), temperature source Oral, resp. rate 16, weight 95.3 kg, SpO2 99 %.  PHYSICAL EXAMINATION:  Physical Exam  GENERAL:  36 y.o.-year-old Caucasian female patient lying in the bed with no acute distress.  EYES: Pupils equal, round, reactive to light and accommodation. No scleral icterus. Extraocular muscles intact.  HEENT: Head atraumatic, normocephalic. Oropharynx and nasopharynx clear.  NECK:  Supple, no jugular venous distention. No thyroid enlargement, no tenderness.  LUNGS:  Normal breath sounds bilaterally, no wheezing, rales,rhonchi or crepitation. No use of accessory muscles of respiration.  CARDIOVASCULAR: Regular rate and rhythm, S1, S2 normal. No murmurs, rubs, or gallops.  ABDOMEN: Soft, nondistended, nontender. Bowel sounds present. No organomegaly or mass.  EXTREMITIES: No pedal edema, cyanosis, or clubbing.  NEUROLOGIC: Cranial nerves II through XII are intact. Muscle strength 5/5 in all extremities. Sensation intact. Gait not checked.  PSYCHIATRIC: The patient is alert and oriented x 3.  Normal affect and good eye contact. SKIN: Scattered skin abscess most notably over the right breast with surrounding erythema, induration warmth and tenderness and smaller abscesses over the left upper chest wall as well as left arm right posterior elbow as well as left hand and throughout the left upper extremity.  She has a large wound with oscillation and surrounding erythema and induration over her left leg and scattered erythematous areas over the right leg.          LABORATORY PANEL:   CBC Recent Labs  Lab 04/02/21 2238  WBC 12.3*  HGB 10.3*  HCT 33.9*  PLT 404*   ------------------------------------------------------------------------------------------------------------------  Chemistries  Recent Labs  Lab 04/01/21 1422 04/02/21 2238  NA 135 136  K 3.9 4.6  CL 100 103  CO2 23 23  GLUCOSE 112* 90  BUN 12 14  CREATININE 0.79 0.80  CALCIUM 9.0 8.6*  AST 24  --   ALT 24  --   ALKPHOS 217*  --   BILITOT <0.1*  --    ------------------------------------------------------------------------------------------------------------------  Cardiac Enzymes No results for input(s): TROPONINI in the last 168 hours. ------------------------------------------------------------------------------------------------------------------  RADIOLOGY:  No results found.    IMPRESSION AND PLAN:  Principal Problem:   Cellulitis of right breast  1.   Multiple skin abscesses  surrounding cellulitis especially involving the right breast. - The patient will be admitted to a medical bed. - We will continue antibiotic therapy with IV cefepime and vancomycin. - Warm compresses will be generalized. - General surgery consult to be obtained. - I notified Dr. Hampton Abbot and keep the patient NPO.  2.  IV cocaine abuse. - I counseled her for cessation of cocaine.  3.  History of IV heroin abuse. - We will continue her methadone.  4.  Peripheral neuropathy. - We will continue Neurontin.  5.  Depression. - We will continue Seroquel.  DVT prophylaxis: Lovenox. Code Status: full code. Family Communication:  The plan of care was discussed in details with the patient (and family). I answered all questions. The patient agreed to proceed with the above mentioned plan. Further management will depend upon hospital course. Disposition Plan: Back to previous home environment Consults called: General surgery. All the records are reviewed and case discussed with ED provider.  Status is: Inpatient  At the time of the admission, it appears that the appropriate admission status for this patient is inpatient.  This is judged to be reasonable and necessary in order to provide the required intensity of service to ensure the patient's safety given the presenting symptoms, physical  exam findings and initial radiographic and laboratory data in the context of comorbid conditions.  The patient requires inpatient status due to high intensity of service, high risk of further deterioration and high frequency of surveillance required.  I certify that at the time of admission, it is my clinical judgment that the patient will require inpatient hospital care extending more than 2 midnights.                            Dispo: The patient is from: Home              Anticipated d/c is to: Home              Patient currently is not medically stable to d/c.              Difficult  to place patient: No  Hannah BeatJan A Deklyn Trachtenberg M.D on 04/03/2021 at 6:28 AM  [facility]   From 7 PM-7 AM, contact night-coverage [email or web address]  CC: Primary care physician; Pcp, No

## 2021-04-03 NOTE — Plan of Care (Signed)
Per Lab and prior RN report, this patient is a very difficult stick.  Lab has attempted 2x and has agreed to send another (to try again).  Labs from 0830 this morning still need to be obtained. Patient current IV has also been attempted - but it does not draw.  Dr. Informed of situation.

## 2021-04-03 NOTE — Progress Notes (Signed)
Pharmacy Antibiotic Note  Karen Clarke is a 36 y.o. female admitted on 04/03/2021 with  wound infection .  Pharmacy has been consulted for Vancomycin dosing.  Plan: Vancomycin 2 gm IV X 1 given in ED on 1/31 @ 0603. Vancomycin 1750 mg IV Q24H ordered to start on 02/01 @ 0600.   AUC = 502.6 Vanc trough = 8.9   Weight: 95.3 kg (210 lb)  Temp (24hrs), Avg:99 F (37.2 C), Min:99 F (37.2 C), Max:99 F (37.2 C)  Recent Labs  Lab 04/01/21 1422 04/02/21 2238  WBC 10.9* 12.3*  CREATININE 0.79 0.80  LATICACIDVEN 1.8 1.9    Estimated Creatinine Clearance: 109.9 mL/min (by C-G formula based on SCr of 0.8 mg/dL).    Allergies  Allergen Reactions   Capsaicin Hives and Itching   Cefazolin Rash    Tolerated cefepime 02/20/20 Negative penicillin skin test 12/27/2020    Antimicrobials this admission:   >>    >>   Dose adjustments this admission:   Microbiology results:  BCx:   UCx:    Sputum:    MRSA PCR:   Thank you for allowing pharmacy to be a part of this patients care.  Abdinasir Spadafore D 04/03/2021 6:30 AM

## 2021-04-03 NOTE — Progress Notes (Signed)
PHARMACY -  BRIEF ANTIBIOTIC NOTE   Pharmacy has received consult(s) for Vancomycin from an ED provider.  The patient's profile has been reviewed for ht/wt/allergies/indication/available labs.    One time order(s) placed for Vancomycin 2 gm IV X 1  Further antibiotics/pharmacy consults should be ordered by admitting physician if indicated.                       Thank you, Mat Stuard D 04/03/2021  3:46 AM

## 2021-04-03 NOTE — Progress Notes (Signed)
Anticoagulation monitoring(Lovenox):  36 yo female ordered Lovenox 40 mg Q24h    Filed Weights   04/02/21 2228  Weight: 95.3 kg (210 lb)   BMI 37    Lab Results  Component Value Date   CREATININE 0.80 04/02/2021   CREATININE 0.79 04/01/2021   CREATININE 0.76 03/03/2021   Estimated Creatinine Clearance: 109.9 mL/min (by C-G formula based on SCr of 0.8 mg/dL). Hemoglobin & Hematocrit     Component Value Date/Time   HGB 10.3 (L) 04/02/2021 2238   HCT 33.9 (L) 04/02/2021 2238     Per Protocol for Patient with estCrcl > 30 ml/min and BMI > 30, will transition to Lovenox 47.5 mg Q24h.

## 2021-04-03 NOTE — Progress Notes (Signed)
Care started prior to midnight in the emergency room and patient was admitted early this morning after midnight by Dr. Valente David, and I am in current agreement with this assessment and plan.  Additional changes to plan of care been made accordingly.  The patient is a obese 36 year old Caucasian female with past medical history significant for but not limited to IgA deficiency, history of SLE, history of IV drug abuse with cocaine abuse as well as multiple skin abscesses and history of endocarditis x2 who presented to the ED with cute onset of worsening and newly developing skin abscesses involving her right breast as well as the left breast and left arm and right elbow in addition to her legs.  She denies any fevers or chills and states that this happened about 4 days ago.  She denies any nausea or vomiting.  She is currently going to outpatient rehab and smokes 4 cigarettes a day and takes methadone for prior history of heroin abuse.  She last injected IV drugs the day before yesterday.  When she came to the ED temperature 99 and vital signs were otherwise normal.  CBC showed a leukocytosis of 12.3 neutrophilia.  UA was negative but 2 days ago her urine is positive for UTI.  Blood cultures sent and in the ED she is given 50 mg of IV Toradol on 1 g of p.o. Tylenol and 4 mg of morphine sulfate and 4 mg IV Zofran as well as IV ceftriaxone and vancomycin.  She is admitted for further evaluation management and please see Dr. Shela Commons. Mansy's H&P for further pictures and details.  General surgery has been consulted for her multiple skin abscesses along with surrounding cellulitis along the right breast.  She is admitted for medical bed and initiated on antibiotic therapy given IV vancomycin and IV cefepime.  Blood cultures are pending but there is high suspicion she may have a bacteremia given his multiple skin abscesses that have formed.  She was counseled for cocaine abuse and we will continue her home methadone for her  heroin abuse.  Will use judicious use of narcotics given her IV drug abuse.  Currently she is being admitted and treated for the following but not limited to:  Multiple skin abscesses  surrounding cellulitis especially involving the right breast and a IV drug user with  IgA deficiency and SLE - The patient will be admitted to a medical bed. -NOT Septic on Admission - We will continue antibiotic therapy with IV cefepime and vancomycin and check MRSA PCR -Follow up on blood Cx -C/w IVF with NS at 100 mL/hr -Judicious use of narcotics and we resumed her home methadone and she has hydrocodone-acetaminophen 1-2 tabs p.o. every 4 as needed for moderate pain and breakthrough pain; she also has acetaminophen 650 mg p.o./RC every 6 as needed for mild pain -Continue with supportive care and antiemetics with ondansetron 4 mg p.o./IV every 6 as needed for nausea -VTE prophylaxis with enoxaparin 47.5 mg - Warm compresses will be generalized. - General surgery consult to be obtained.and Dr. Tonna Boehringer was notified  and will keep the patient NPO except Meds and Sips.  IV cocaine abuse. - I counseled her for cessation of cocaine.  History of IV heroin abuse. - We will continue her methadone.  Peripheral neuropathy. - We will continue Neurontin.  Depression. - We will continue quetiapine 100 mg p.o. daily nightly.  Microcytic Anemia -Patient's Hgb/Hct went from 9.5/32.3 -> 10.3/33.9 and repeat still not drawn this AM -Check Anemia  Panel  -Continue to Monitor for S/Sx of Bleeding; No overt bleeding noted -Repeat CBC in the AM  Thrombocytosis -Likely Reactive -Patient's Platelet Count went from 445 -> 404 -Continue to Monitor and Trend -Repeat CBC in the AM   Obesity -Complicates overall prognosis and care -Estimated body mass index is 37.2 kg/m as calculated from the following:   Height as of 04/01/21: 5\' 3"  (1.6 m).   Weight as of this encounter: 95.3 kg.  -Weight Loss and Dietary Counseling  given   General surgery Dr. is still to see the patient but currently she is n.p.o. and will need to continue monitor patient's clinical response to intervention, follow-up on blood cultures and follow-up on surgical recommendations.  She may need ID involvement throughout the course of her hospitalization.

## 2021-04-03 NOTE — ED Notes (Signed)
Pt continuously coming out to lobby and trying to go outside, IV in place with spouse. Advised she is not able to go outside while admitted.  Primary RN notified.

## 2021-04-03 NOTE — ED Notes (Signed)
This RN assisted as chaperone as Dr. Karma Greaser attempted to assess patient's wounds to chest. Patient began yelling and swearing at Dr. Karma Greaser and would not allow assessment of wounds.

## 2021-04-03 NOTE — Consult Note (Signed)
Subjective:   CC: skin abscess  HPI:  Karen Clarke is a 36 y.o. female who was consulted by [facility] for evaluation of above.  Various chronicity of multiple skin lesions throughout body.  No I&D in the past.  These have been acute on chronic secondary to lupus and IV drug use.  Currently not on steroids or abx.  Last cocaine use was three days ago, IV.   Past Medical History:  has a past medical history of IgA deficiency ([place]) and Lupus ([place]).  Past Surgical History:  has a past surgical history that includes TEE without cardioversion (N/A, 02/09/2021) and Tonsillectomy.  Family History: family history is not on file.  Social History:  reports that she has been smoking cigarettes. She has never used smokeless tobacco. She reports current drug use. Drug: IV. She reports that she does not drink alcohol.  Current Medications:  Prior to Admission medications   Medication Sig Start Date End Date Taking? Authorizing Provider  methadone (DOLOPHINE) 10 MG/ML solution Take 100 mg by mouth daily.   Yes [provider]  prazosin (MINIPRESS) 2 MG capsule Take 2 mg by mouth at bedtime. 12/13/20  Yes [provider]  QUEtiapine (SEROQUEL) 100 MG tablet Take 100 mg by mouth at bedtime. 12/30/20  Yes [provider]  citalopram (CELEXA) 20 MG tablet Take 20 mg by mouth at bedtime. Patient not taking: Reported on 02/06/2021 10/03/20   [provider]    Allergies:  Allergies  Allergen Reactions   Capsaicin Hives and Itching   Cefazolin Rash    Tolerated cefepime 02/20/20 Negative penicillin skin test 12/27/2020    ROS:  General: Denies weight loss, weight gain, fatigue, fevers, chills, and night sweats. Eyes: Denies blurry vision, double vision, eye pain, itchy eyes, and tearing. Ears: Denies hearing loss, earache, and ringing in ears. Nose: Denies sinus pain, congestion, infections, runny nose, and nosebleeds. Mouth/throat: Denies hoarseness, sore throat, bleeding  gums, and difficulty swallowing. Heart: Denies chest pain, palpitations, racing heart, irregular heartbeat, leg pain or swelling, and decreased activity tolerance. Respiratory: Denies breathing difficulty, shortness of breath, wheezing, cough, and sputum. GI: Denies change in appetite, heartburn, nausea, vomiting, constipation, diarrhea, and blood in stool. GU: Denies difficulty urinating, pain with urinating, urgency, frequency, blood in urine. Musculoskeletal: Denies joint stiffness, pain, swelling, muscle weakness. Skin: Denies rash, itching, mass, tumors Neurologic: Denies headache, fainting, dizziness, seizures, numbness, and tingling. Psychiatric: Denies depression, anxiety, difficulty sleeping, and memory loss. Endocrine: Denies heat or cold intolerance, and increased thirst or urination. Blood/lymph: Denies easy bruising, easy bruising, and swollen glands     Objective:     BP (!) 110/58    Pulse 77    Temp 99 F (37.2 C) (Oral)    Resp 16    Wt 95.3 kg    SpO2 99%    BMI 37.20 kg/m   Constitutional :  alert, cooperative, appears stated age, and no distress  Lymphatics/Throat:  no asymmetry, masses, or scars  Respiratory:  clear to auscultation bilaterally  Cardiovascular:  regular rate and rhythm  Gastrointestinal: soft, non-tender; bowel sounds normal; no masses,  no organomegaly.  Musculoskeletal: Steady gait and movement  Skin: Cool and moist, see pics below from chart.  Pt refusing exam at this time due to pain.  Psychiatric: Normal affect, non-agitated, not confused                LABS:  CMP Latest Ref Rng & Units 04/02/2021 04/01/2021 03/03/2021  Glucose 70 -  99 mg/dL 90 112(H) 95  BUN 6 - 20 mg/dL 14 12 8   Creatinine 0.44 - 1.00 mg/dL 0.80 0.79 0.76  Sodium 135 - 145 mmol/L 136 135 137  Potassium 3.5 - 5.1 mmol/L 4.6 3.9 3.8  Chloride 98 - 111 mmol/L 103 100 107  CO2 22 - 32 mmol/L 23 23 26   Calcium 8.9 - 10.3 mg/dL 8.6(L) 9.0 8.2(L)  Total Protein 6.5  - 8.1 g/dL - 7.7 -  Total Bilirubin 0.3 - 1.2 mg/dL - <0.1(L) -  Alkaline Phos 38 - 126 U/L - 217(H) -  AST 15 - 41 U/L - 24 -  ALT 0 - 44 U/L - 24 -   CBC Latest Ref Rng & Units 04/02/2021 04/01/2021 03/03/2021  WBC 4.0 - 10.5 K/uL 12.3(H) 10.9(H) 5.1  Hemoglobin 12.0 - 15.0 g/dL 10.3(L) 9.5(L) 8.2(L)  Hematocrit 36.0 - 46.0 % 33.9(L) 32.3(L) 27.2(L)  Platelets 150 - 400 K/uL 404(H) 445(H) 262    RADS: N/a  Assessment:     Multiple skin abscess secondary to lupus flare, IV drug abuse. Recommend exam under anesthesia, debridment, I&D as needed since pt refusing any bedside exam or procedures at this time.   Plan:     1. Alternatives include continued observation.  Benefits include possible symptom relief, pathologic evaluation, improved cosmesis. Discussed the risk of surgery including recurrence, chronic pain, post-op infxn, poor cosmesis, poor/delayed wound healing, and possible re-operation to address said risks. The risks of general anesthetic, if used, includes MI, CVA, sudden death or even reaction to anesthetic medications also discussed.  Typical post-op recovery time of weeks of wound care with possible activity restrictions were also discussed.  The patient verbalized understanding and all questions were answered to the patient's satisfaction.  Will proceed when cocaine out of system in next 2-3 days.  Latest UDS still positive for cocaine.  Local wound care with wet to dry, IV abx as needed for the cellulitis.  Pain management per hospitalist due to complex medical history with methadone use and treatment as outpt.

## 2021-04-03 NOTE — Anesthesia Preprocedure Evaluation (Deleted)
Anesthesia Evaluation  Patient identified by MRN, date of birth, ID band Patient awake    Reviewed: Allergy & Precautions, H&P , NPO status , Patient's Chart, lab work & pertinent test results, reviewed documented beta blocker date and time   Airway Mallampati: II  TM Distance: >3 FB Neck ROM: full    Dental  (+) Teeth Intact   Pulmonary neg pulmonary ROS, Current Smoker,    Pulmonary exam normal        Cardiovascular Exercise Tolerance: Good negative cardio ROS Normal cardiovascular exam Rate:Normal     Neuro/Psych negative neurological ROS  negative psych ROS   GI/Hepatic negative GI ROS, Neg liver ROS,   Endo/Other  negative endocrine ROS  Renal/GU negative Renal ROS  negative genitourinary   Musculoskeletal   Abdominal   Peds  Hematology negative hematology ROS (+)   Anesthesia Other Findings   Reproductive/Obstetrics negative OB ROS                             Anesthesia Physical Anesthesia Plan  ASA: 2  Anesthesia Plan: General LMA   Post-op Pain Management:    Induction:   PONV Risk Score and Plan:   Airway Management Planned:   Additional Equipment:   Intra-op Plan:   Post-operative Plan:   Informed Consent: I have reviewed the patients History and Physical, chart, labs and discussed the procedure including the risks, benefits and alternatives for the proposed anesthesia with the patient or authorized representative who has indicated his/her understanding and acceptance.       Plan Discussed with: CRNA  Anesthesia Plan Comments:         Anesthesia Quick Evaluation  

## 2021-04-03 NOTE — Plan of Care (Signed)
Lab contacted to inform patient is MRSA positive.  Hospitalist and Gen Surgery informed.

## 2021-04-04 ENCOUNTER — Encounter: Payer: Self-pay | Admitting: Family Medicine

## 2021-04-04 LAB — COMPREHENSIVE METABOLIC PANEL
ALT: 17 U/L (ref 0–44)
AST: 21 U/L (ref 15–41)
Albumin: 2.8 g/dL — ABNORMAL LOW (ref 3.5–5.0)
Alkaline Phosphatase: 163 U/L — ABNORMAL HIGH (ref 38–126)
Anion gap: 8 (ref 5–15)
BUN: 10 mg/dL (ref 6–20)
CO2: 23 mmol/L (ref 22–32)
Calcium: 8.4 mg/dL — ABNORMAL LOW (ref 8.9–10.3)
Chloride: 109 mmol/L (ref 98–111)
Creatinine, Ser: 0.79 mg/dL (ref 0.44–1.00)
GFR, Estimated: >60 mL/min (ref >60)
Glucose, Bld: 104 mg/dL — ABNORMAL HIGH (ref 70–99)
Potassium: 4.4 mmol/L (ref 3.5–5.1)
Sodium: 140 mmol/L (ref 135–145)
Total Bilirubin: 0.1 mg/dL — ABNORMAL LOW (ref 0.3–1.2)
Total Protein: 6.6 g/dL (ref 6.5–8.1)

## 2021-04-04 LAB — CBC WITH DIFFERENTIAL/PLATELET
Abs Immature Granulocytes: 0.07 10*3/uL (ref 0.00–0.07)
Basophils Absolute: 0.1 10*3/uL (ref 0.0–0.1)
Basophils Relative: 1 %
Eosinophils Absolute: 0.3 10*3/uL (ref 0.0–0.5)
Eosinophils Relative: 4 %
HCT: 27.3 % — ABNORMAL LOW (ref 36.0–46.0)
Hemoglobin: 8.5 g/dL — ABNORMAL LOW (ref 12.0–15.0)
Immature Granulocytes: 1 %
Lymphocytes Relative: 34 %
Lymphs Abs: 2.1 10*3/uL (ref 0.7–4.0)
MCH: 23.5 pg — ABNORMAL LOW (ref 26.0–34.0)
MCHC: 31.1 g/dL (ref 30.0–36.0)
MCV: 75.4 fL — ABNORMAL LOW (ref 80.0–100.0)
Monocytes Absolute: 0.3 10*3/uL (ref 0.1–1.0)
Monocytes Relative: 5 %
Neutro Abs: 3.5 10*3/uL (ref 1.7–7.7)
Neutrophils Relative %: 55 %
Platelets: 430 10*3/uL — ABNORMAL HIGH (ref 150–400)
RBC: 3.62 MIL/uL — ABNORMAL LOW (ref 3.87–5.11)
RDW: 15.3 % (ref 11.5–15.5)
WBC: 6.3 10*3/uL (ref 4.0–10.5)
nRBC: 0 % (ref 0.0–0.2)

## 2021-04-04 LAB — PHOSPHORUS: Phosphorus: 3.9 mg/dL (ref 2.5–4.6)

## 2021-04-04 LAB — MAGNESIUM: Magnesium: 2.1 mg/dL (ref 1.7–2.4)

## 2021-04-04 MED ORDER — VANCOMYCIN HCL IN DEXTROSE 1-5 GM/200ML-% IV SOLN
1000.0000 mg | Freq: Two times a day (BID) | INTRAVENOUS | Status: DC
  Administered 2021-04-04 – 2021-04-05 (×3): 1000 mg via INTRAVENOUS
  Filled 2021-04-04 (×5): qty 200

## 2021-04-04 NOTE — TOC CM/SW Note (Signed)
Went by room for readmission prevention screen. Patient asked that CSW come back later.   Charlynn Court, CSW [phone]

## 2021-04-04 NOTE — Progress Notes (Signed)
Progress Note    Karen Shirtsvey Mancino  WUJ:[id number] DOB: 1985/06/23  DOA: 04/03/2021 PCP: Pcp, No      Brief Narrative:    Medical records reviewed and are as summarized below:  Karen Clarke is a 36 y.o. female with medical history significant for systemic lupus erythematosus, IgA deficiency, cocaine use disorder, multiple skin abscesses, who presented to the hospital because of new skin abscesses involving both breasts, abdomen and lower extremities.      Assessment/Plan:   Principal Problem:   Cellulitis of right breast    Body mass index is 39.25 kg/m.  (Morbid obesity)   Multiple skin abscesses with surrounding cellulitis involving the breasts, upper and lower extremities: Continue IV vancomycin.  Analgesics as needed for pain.  Surgeon plans to perform I&D/debridement in the next 1 to 2 days.  Unfortunately urine drug screen was positive on 04/03/2021.  Discontinue IV fluids.  Opioid use disorder: Continue methadone  Cocaine use disorder: Counseled to quit  Other comorbidities include IgA deficiency, SLE, depression, chronic microcytic anemia, peripheral neuropathy   Diet Order             Diet NPO time specified  Diet effective midnight           Diet regular Room service appropriate? Yes; Fluid consistency: Thin  Diet effective now                      Consultants: General surgeon  Procedures: None    Medications:    enoxaparin (LOVENOX) injection  0.5 mg/kg Subcutaneous Q24H   methadone  100 mg Oral q AM   nicotine  14 mg Transdermal Daily   QUEtiapine  100 mg Oral QHS   Continuous Infusions:  sodium chloride 100 mL/hr at 04/03/21 0656   vancomycin       Anti-infectives (From admission, onward)    Start     Dose/Rate Route Frequency Ordered Stop   04/04/21 2100  vancomycin (VANCOCIN) IVPB 1000 mg/200 mL premix        1,000 mg 200 mL/hr over 60 Minutes Intravenous Every 12 hours 04/04/21 0939     04/04/21 0600  vancomycin  (VANCOREADY) IVPB 1750 mg/350 mL  Status:  Discontinued        1,750 mg 175 mL/hr over 120 Minutes Intravenous Every 24 hours 04/03/21 0629 04/04/21 0939   04/03/21 0630  vancomycin (VANCOCIN) IVPB 1000 mg/200 mL premix  Status:  Discontinued        1,000 mg 200 mL/hr over 60 Minutes Intravenous  Once 04/03/21 0617 04/03/21 0629   04/03/21 0400  vancomycin (VANCOREADY) IVPB 2000 mg/400 mL        2,000 mg 200 mL/hr over 120 Minutes Intravenous  Once 04/03/21 0345 04/03/21 0829   04/03/21 0345  cefTRIAXone (ROCEPHIN) 2 g in sodium chloride 0.9 % 100 mL IVPB        2 g 200 mL/hr over 30 Minutes Intravenous STAT 04/03/21 0340 04/03/21 0601              Family Communication/Anticipated D/C date and plan/Code Status   DVT prophylaxis:      Code Status: Full Code  Family Communication: None Disposition Plan: Plan to discharge home when medically stable   Status is: Inpatient Remains inpatient appropriate because: IV IV antibiotics, awaiting I&D  Planned Discharge Destination: Home             Subjective:   Interval events noted.  She complains of  pain in bilateral breasts, upper and lower extremities  Objective:    Vitals:   04/03/21 1926 04/03/21 2107 04/04/21 0319 04/04/21 0825  BP:  (!) 145/75 (!) 96/59 112/63  Pulse: 85 86 89 93  Resp:  20 16 18   Temp:  98.6 F (37 C) 98.3 F (36.8 C) 98.7 F (37.1 C)  TempSrc:  Oral    SpO2: 95% 99% 94% 95%  Weight:  100.5 kg    Height:  5\' 3"  (1.6 m)     No data found.   Intake/Output Summary (Last 24 hours) at 04/04/2021 1017 Last data filed at 04/04/2021 0900 Gross per 24 hour  Intake 240 ml  Output --  Net 240 ml   Filed Weights   04/02/21 2228 04/03/21 2107  Weight: 95.3 kg 100.5 kg    Exam:   GEN: NAD SKIN: Multiple skin lesions and abscesses on the breasts, upper and lower extremities EYES: No pallor or icterus ENT: MMM CV: RRR PULM: CTA B ABD: soft, ND, NT, +BS CNS: AAO x 3, non  focal EXT: No edema or tenderness    [place], RN at bedside   Data Reviewed:   I have personally reviewed following labs and imaging studies:  Labs: Labs show the following:   Basic Metabolic Panel: Recent Labs  Lab 04/01/21 1422 04/02/21 2238 04/03/21 2148 04/04/21 0441  NA 135 136 137 140  K 3.9 4.6 3.8 4.4  CL 100 103 105 109  CO2 23 23 23 23   GLUCOSE 112* 90 125* 104*  BUN 12 14 14 10   CREATININE 0.79 0.80 0.80 0.79  CALCIUM 9.0 8.6* 8.4* 8.4*  MG  --   --  2.0 2.1  PHOS  --   --  3.5 3.9   GFR Estimated Creatinine Clearance: 113.1 mL/min (by C-G formula based on SCr of 0.79 mg/dL). Liver Function Tests: Recent Labs  Lab 04/01/21 1422 04/03/21 2148 04/04/21 0441  AST 24 24 21   ALT 24 18 17   ALKPHOS 217* 170* 163*  BILITOT <0.1* 0.2* 0.1*  PROT 7.7 6.7 6.6  ALBUMIN 3.7 2.9* 2.8*   No results for input(s): LIPASE, AMYLASE in the last 168 hours. No results for input(s): AMMONIA in the last 168 hours. Coagulation profile No results for input(s): INR, PROTIME in the last 168 hours.  CBC: Recent Labs  Lab 04/01/21 1422 04/02/21 2238 04/03/21 2148 04/04/21 0441  WBC 10.9* 12.3* 7.2 6.3  NEUTROABS 7.2 8.0* 4.7 3.5  HGB 9.5* 10.3* 9.1* 8.5*  HCT 32.3* 33.9* 30.2* 27.3*  MCV 79.2* 76.2* 77.8* 75.4*  PLT 445* 404* 388 430*   Cardiac Enzymes: No results for input(s): CKTOTAL, CKMB, CKMBINDEX, TROPONINI in the last 168 hours. BNP (last 3 results) No results for input(s): PROBNP in the last 8760 hours. CBG: No results for input(s): GLUCAP in the last 168 hours. D-Dimer: No results for input(s): DDIMER in the last 72 hours. Hgb A1c: No results for input(s): HGBA1C in the last 72 hours. Lipid Profile: No results for input(s): CHOL, HDL, LDLCALC, TRIG, CHOLHDL, LDLDIRECT in the last 72 hours. Thyroid function studies: No results for input(s): TSH, T4TOTAL, T3FREE, THYROIDAB in the last 72 hours.  Invalid input(s): FREET3 Anemia work up: No  results for input(s): VITAMINB12, FOLATE, FERRITIN, TIBC, IRON, RETICCTPCT in the last 72 hours. Sepsis Labs: Recent Labs  Lab 04/01/21 1422 04/02/21 2238 04/03/21 2148 04/04/21 0441  PROCALCITON  --  0.14  --   --   WBC 10.9* 12.3* 7.2  6.3  LATICACIDVEN 1.8 1.9  --   --     Microbiology Recent Results (from the past 240 hour(s))  Blood culture (routine x 2)     Status: None (Preliminary result)   Collection Time: 04/02/21 10:38 PM   Specimen: BLOOD  Result Value Ref Range Status   Specimen Description BLOOD RIGHT ARM  Final   Special Requests IN PEDIATRIC BOTTLE Blood Culture adequate volume  Final   Culture   Final    NO GROWTH 2 DAYS Performed at [facility], [address], [place], [place] [address]    Report Status PENDING  Incomplete  Resp Panel by RT-PCR (Flu A&B, Covid) Nasopharyngeal Swab     Status: None   Collection Time: 04/03/21  6:07 AM   Specimen: Nasopharyngeal Swab; Nasopharyngeal(NP) swabs in vial transport medium  Result Value Ref Range Status   SARS Coronavirus 2 by RT PCR NEGATIVE NEGATIVE Final    Comment: (NOTE) SARS-CoV-2 target nucleic acids are NOT DETECTED.  The SARS-CoV-2 RNA is generally detectable in upper respiratory specimens during the acute phase of infection. The lowest concentration of SARS-CoV-2 viral copies this assay can detect is 138 copies/mL. A negative result does not preclude SARS-Cov-2 infection and should not be used as the sole basis for treatment or other patient management decisions. A negative result may occur with  improper specimen collection/handling, submission of specimen other than nasopharyngeal swab, presence of viral mutation(s) within the areas targeted by this assay, and inadequate number of viral copies(<138 copies/mL). A negative result must be combined with clinical observations, patient history, and epidemiological information. The expected result is Negative.  Fact Sheet for Patients:   BloggerCourse.com  Fact Sheet for Healthcare Providers:  SeriousBroker.it  This test is no t yet approved or cleared by the [place] FDA and  has been authorized for detection and/or diagnosis of SARS-CoV-2 by FDA under an Emergency Use Authorization (EUA). This EUA will remain  in effect (meaning this test can be used) for the duration of the COVID-19 declaration under Section 564(b)(1) of the Act, 21 U.S.C.section 360bbb-3(b)(1), unless the authorization is terminated  or revoked sooner.       Influenza A by PCR NEGATIVE NEGATIVE Final   Influenza B by PCR NEGATIVE NEGATIVE Final    Comment: (NOTE) The Xpert Xpress SARS-CoV-2/FLU/RSV plus assay is intended as an aid in the diagnosis of influenza from Nasopharyngeal swab specimens and should not be used as a sole basis for treatment. Nasal washings and aspirates are unacceptable for Xpert Xpress SARS-CoV-2/FLU/RSV testing.  Fact Sheet for Patients: BloggerCourse.com  Fact Sheet for Healthcare Providers: SeriousBroker.it  This test is not yet approved or cleared by the [place] FDA and has been authorized for detection and/or diagnosis of SARS-CoV-2 by FDA under an Emergency Use Authorization (EUA). This EUA will remain in effect (meaning this test can be used) for the duration of the COVID-19 declaration under Section 564(b)(1) of the Act, 21 U.S.C. section 360bbb-3(b)(1), unless the authorization is terminated or revoked.  Performed at [facility], [address] [place], [place] [address]   Blood Culture (routine x 2)     Status: None (Preliminary result)   Collection Time: 04/03/21  6:40 AM   Specimen: BLOOD RIGHT HAND  Result Value Ref Range Status   Specimen Description BLOOD RIGHT HAND  Final   Special Requests   Final    BOTTLES DRAWN AEROBIC ONLY Blood Culture adequate volume   Culture  Final    NO GROWTH < 24 HOURS Performed at [facility], [address] [place], [place] [address]    Report Status PENDING  Incomplete  MRSA Next Gen by PCR, Nasal     Status: Abnormal   Collection Time: 04/03/21  4:30 PM   Specimen: Nasal Mucosa; Nasal Swab  Result Value Ref Range Status   MRSA by PCR Next Gen DETECTED (A) NOT DETECTED Final    Comment: RESULT CALLED TO, READ BACK BY AND VERIFIED WITH: Tonia Ghent 1823 04/03/21 MU (NOTE) The GeneXpert MRSA Assay (FDA approved for NASAL specimens only), is one component of a comprehensive MRSA colonization surveillance program. It is not intended to diagnose MRSA infection nor to guide or monitor treatment for MRSA infections. Test performance is not FDA approved in patients less than 3 years old. Performed at [facility], [address] [place], [place] [address]     Procedures and diagnostic studies:  No results found.             LOS: 1 day   Zeya Balles  [facility]   Pager on [email or web address]. If 7PM-7AM, please contact night-coverage at [email or web address]     04/04/2021, 10:17 AM

## 2021-04-04 NOTE — Progress Notes (Signed)
Pharmacy Antibiotic Note  Karen Clarke is a 36 y.o. female w/ PMH of polysubstance abuse, depression, SLE, IgA deficiency admitted on 04/03/2021 with skin abscesses involving her right breast as well as left breast, left arm and right elbow in addition to to her legs. Pharmacy has been consulted for vancomycin dosing. Plan currently is for possible I&D.  Plan:   adjust vancomycin dose to 1000 mg IV Q12H  Expected AUC: 544.7 SCr used: 0.80 mg/dL (rounded up) Ke 0.073 h-1, T1/2 9.5 h Levels as clinically indicated Daily renal function assessment while on IV vancomycin  Height: 5\' 3"  (160 cm) Weight: 100.5 kg (221 lb 9 oz) IBW/kg (Calculated) : 52.4  Temp (24hrs), Avg:98.5 F (36.9 C), Min:98.1 F (36.7 C), Max:98.7 F (37.1 C)  Recent Labs  Lab 04/01/21 1422 04/02/21 2238 04/03/21 2148 04/04/21 0441  WBC 10.9* 12.3* 7.2 6.3  CREATININE 0.79 0.80 0.80 0.79  LATICACIDVEN 1.8 1.9  --   --      Estimated Creatinine Clearance: 113.1 mL/min (by C-G formula based on SCr of 0.79 mg/dL).    Allergies  Allergen Reactions   Capsaicin Hives and Itching   Cefazolin Rash    Tolerated cefepime 02/20/20 Negative penicillin skin test 12/27/2020    Antimicrobials this admission: 01/31 ceftriaxone x 1  01/31 vancomycin  >>    Microbiology results: 01/30 BCx: NG x 2 days 01/31 MRSA PCR: positive  Thank you for allowing pharmacy to be a part of this patients care.  Dallie Piles 04/04/2021 9:15 AM

## 2021-04-05 LAB — CREATININE, SERUM
Creatinine, Ser: 0.74 mg/dL (ref 0.44–1.00)
GFR, Estimated: >60 mL/min (ref >60)

## 2021-04-05 NOTE — Progress Notes (Addendum)
Progress Note    Ellieana Clarke  TIW:[id number] DOB: 1985/09/29  DOA: 04/03/2021 PCP: Pcp, No      Brief Narrative:    Medical records reviewed and are as summarized below:  Karen Clarke is a 36 y.o. female with medical history significant for systemic lupus erythematosus, IgA deficiency, cocaine use disorder, multiple skin abscesses, who presented to the hospital because of new skin abscesses involving both breasts, abdomen and lower extremities.      Assessment/Plan:   Principal Problem:   Cellulitis of right breast    Body mass index is 39.25 kg/m.  (Obesity)   Multiple skin abscesses with surrounding cellulitis involving the breasts, upper and lower extremities: Continue IV vancomycin and analgesics as needed.  Surgeon plans to perform I&D/debridement tomorrow.  Unfortunately urine drug screen was positive on 04/03/2021.    Opioid use disorder: Continue methadone  Cocaine use disorder: Counseled to quit  Other comorbidities include IgA deficiency, SLE, depression, chronic microcytic anemia, peripheral neuropathy   Diet Order             Diet NPO time specified  Diet effective midnight           Diet regular Room service appropriate? Yes; Fluid consistency: Thin  Diet effective now                      Consultants: General surgeon  Procedures: None    Medications:    enoxaparin (LOVENOX) injection  0.5 mg/kg Subcutaneous Q24H   methadone  100 mg Oral q AM   nicotine  14 mg Transdermal Daily   QUEtiapine  100 mg Oral QHS   Continuous Infusions:  vancomycin 1,000 mg (04/05/21 0830)     Anti-infectives (From admission, onward)    Start     Dose/Rate Route Frequency Ordered Stop   04/04/21 2100  vancomycin (VANCOCIN) IVPB 1000 mg/200 mL premix        1,000 mg 200 mL/hr over 60 Minutes Intravenous Every 12 hours 04/04/21 0939     04/04/21 0600  vancomycin (VANCOREADY) IVPB 1750 mg/350 mL  Status:  Discontinued        1,750 mg 175 mL/hr  over 120 Minutes Intravenous Every 24 hours 04/03/21 0629 04/04/21 0939   04/03/21 0630  vancomycin (VANCOCIN) IVPB 1000 mg/200 mL premix  Status:  Discontinued        1,000 mg 200 mL/hr over 60 Minutes Intravenous  Once 04/03/21 0617 04/03/21 0629   04/03/21 0400  vancomycin (VANCOREADY) IVPB 2000 mg/400 mL        2,000 mg 200 mL/hr over 120 Minutes Intravenous  Once 04/03/21 0345 04/03/21 0829   04/03/21 0345  cefTRIAXone (ROCEPHIN) 2 g in sodium chloride 0.9 % 100 mL IVPB        2 g 200 mL/hr over 30 Minutes Intravenous STAT 04/03/21 0340 04/03/21 0601              Family Communication/Anticipated D/C date and plan/Code Status   DVT prophylaxis:      Code Status: Full Code  Family Communication: None Disposition Plan: Plan to discharge home when medically stable   Status is: Inpatient Remains inpatient appropriate because: IV antibiotics, awaiting I&D  Planned Discharge Destination: Home             Subjective:   Interval events noted.  She complains of pain in bilateral breasts, upper and lower extremities  Objective:    Vitals:   04/04/21 0825 04/04/21  1536 04/04/21 2102 04/05/21 0737  BP: 112/63 116/66 126/68 (!) 97/56  Pulse: 93 77 73 72  Resp: 18 16 18    Temp: 98.7 F (37.1 C) 98 F (36.7 C) 99.7 F (37.6 C)   TempSrc:   Oral   SpO2: 95% 97% 97% 97%  Weight:      Height:       No data found.   Intake/Output Summary (Last 24 hours) at 04/05/2021 1420 Last data filed at 04/05/2021 0900 Gross per 24 hour  Intake 440 ml  Output --  Net 440 ml   Filed Weights   04/02/21 2228 04/03/21 2107  Weight: 95.3 kg 100.5 kg    Exam:   GEN: NAD SKIN: Multiple skin lesions and abscesses on the upper and lower extremities EYES: No pallor or icterus ENT: MMM CV: RRR PULM: CTA B ABD: soft, ND, NT, +BS CNS: AAO x 3, non focal EXT: Swelling and tenderness of the upper and lower extremities      Data Reviewed:   I have personally  reviewed following labs and imaging studies:  Labs: Labs show the following:   Basic Metabolic Panel: Recent Labs  Lab 04/01/21 1422 04/02/21 2238 04/03/21 2148 04/04/21 0441 04/05/21 0458  NA 135 136 137 140  --   K 3.9 4.6 3.8 4.4  --   CL 100 103 105 109  --   CO2 23 23 23 23   --   GLUCOSE 112* 90 125* 104*  --   BUN 12 14 14 10   --   CREATININE 0.79 0.80 0.80 0.79 0.74  CALCIUM 9.0 8.6* 8.4* 8.4*  --   MG  --   --  2.0 2.1  --   PHOS  --   --  3.5 3.9  --    GFR Estimated Creatinine Clearance: 113.1 mL/min (by C-G formula based on SCr of 0.74 mg/dL). Liver Function Tests: Recent Labs  Lab 04/01/21 1422 04/03/21 2148 04/04/21 0441  AST 24 24 21   ALT 24 18 17   ALKPHOS 217* 170* 163*  BILITOT <0.1* 0.2* 0.1*  PROT 7.7 6.7 6.6  ALBUMIN 3.7 2.9* 2.8*   No results for input(s): LIPASE, AMYLASE in the last 168 hours. No results for input(s): AMMONIA in the last 168 hours. Coagulation profile No results for input(s): INR, PROTIME in the last 168 hours.  CBC: Recent Labs  Lab 04/01/21 1422 04/02/21 2238 04/03/21 2148 04/04/21 0441  WBC 10.9* 12.3* 7.2 6.3  NEUTROABS 7.2 8.0* 4.7 3.5  HGB 9.5* 10.3* 9.1* 8.5*  HCT 32.3* 33.9* 30.2* 27.3*  MCV 79.2* 76.2* 77.8* 75.4*  PLT 445* 404* 388 430*   Cardiac Enzymes: No results for input(s): CKTOTAL, CKMB, CKMBINDEX, TROPONINI in the last 168 hours. BNP (last 3 results) No results for input(s): PROBNP in the last 8760 hours. CBG: No results for input(s): GLUCAP in the last 168 hours. D-Dimer: No results for input(s): DDIMER in the last 72 hours. Hgb A1c: No results for input(s): HGBA1C in the last 72 hours. Lipid Profile: No results for input(s): CHOL, HDL, LDLCALC, TRIG, CHOLHDL, LDLDIRECT in the last 72 hours. Thyroid function studies: No results for input(s): TSH, T4TOTAL, T3FREE, THYROIDAB in the last 72 hours.  Invalid input(s): FREET3 Anemia work up: No results for input(s): VITAMINB12, FOLATE,  FERRITIN, TIBC, IRON, RETICCTPCT in the last 72 hours. Sepsis Labs: Recent Labs  Lab 04/01/21 1422 04/02/21 2238 04/03/21 2148 04/04/21 0441  PROCALCITON  --  0.14  --   --  WBC 10.9* 12.3* 7.2 6.3  LATICACIDVEN 1.8 1.9  --   --     Microbiology Recent Results (from the past 240 hour(s))  Blood culture (routine x 2)     Status: None (Preliminary result)   Collection Time: 04/02/21 10:38 PM   Specimen: BLOOD  Result Value Ref Range Status   Specimen Description BLOOD RIGHT ARM  Final   Special Requests IN PEDIATRIC BOTTLE Blood Culture adequate volume  Final   Culture   Final    NO GROWTH 2 DAYS Performed at [facility], [address] [place], [place] [address]    Report Status PENDING  Incomplete  Resp Panel by RT-PCR (Flu A&B, Covid) Nasopharyngeal Swab     Status: None   Collection Time: 04/03/21  6:07 AM   Specimen: Nasopharyngeal Swab; Nasopharyngeal(NP) swabs in vial transport medium  Result Value Ref Range Status   SARS Coronavirus 2 by RT PCR NEGATIVE NEGATIVE Final    Comment: (NOTE) SARS-CoV-2 target nucleic acids are NOT DETECTED.  The SARS-CoV-2 RNA is generally detectable in upper respiratory specimens during the acute phase of infection. The lowest concentration of SARS-CoV-2 viral copies this assay can detect is 138 copies/mL. A negative result does not preclude SARS-Cov-2 infection and should not be used as the sole basis for treatment or other patient management decisions. A negative result may occur with  improper specimen collection/handling, submission of specimen other than nasopharyngeal swab, presence of viral mutation(s) within the areas targeted by this assay, and inadequate number of viral copies(<138 copies/mL). A negative result must be combined with clinical observations, patient history, and epidemiological information. The expected result is Negative.  Fact Sheet for Patients:   BloggerCourse.comhttps://www.fda.gov/media/152166/download  Fact Sheet for Healthcare Providers:  SeriousBroker.ithttps://www.fda.gov/media/152162/download  This test is no t yet approved or cleared by the [place] FDA and  has been authorized for detection and/or diagnosis of SARS-CoV-2 by FDA under an Emergency Use Authorization (EUA). This EUA will remain  in effect (meaning this test can be used) for the duration of the COVID-19 declaration under Section 564(b)(1) of the Act, 21 U.S.C.section 360bbb-3(b)(1), unless the authorization is terminated  or revoked sooner.       Influenza A by PCR NEGATIVE NEGATIVE Final   Influenza B by PCR NEGATIVE NEGATIVE Final    Comment: (NOTE) The Xpert Xpress SARS-CoV-2/FLU/RSV plus assay is intended as an aid in the diagnosis of influenza from Nasopharyngeal swab specimens and should not be used as a sole basis for treatment. Nasal washings and aspirates are unacceptable for Xpert Xpress SARS-CoV-2/FLU/RSV testing.  Fact Sheet for Patients: BloggerCourse.comhttps://www.fda.gov/media/152166/download  Fact Sheet for Healthcare Providers: SeriousBroker.ithttps://www.fda.gov/media/152162/download  This test is not yet approved or cleared by the [place] FDA and has been authorized for detection and/or diagnosis of SARS-CoV-2 by FDA under an Emergency Use Authorization (EUA). This EUA will remain in effect (meaning this test can be used) for the duration of the COVID-19 declaration under Section 564(b)(1) of the Act, 21 U.S.C. section 360bbb-3(b)(1), unless the authorization is terminated or revoked.  Performed at [facility], [address] [place], [place] [address]   Blood Culture (routine x 2)     Status: None (Preliminary result)   Collection Time: 04/03/21  6:40 AM   Specimen: BLOOD RIGHT HAND  Result Value Ref Range Status   Specimen Description BLOOD RIGHT HAND  Final   Special Requests   Final    BOTTLES DRAWN AEROBIC ONLY Blood Culture adequate volume  Culture    Final    NO GROWTH < 24 HOURS Performed at [facility], [address] [place], [place] [address]    Report Status PENDING  Incomplete  MRSA Next Gen by PCR, Nasal     Status: Abnormal   Collection Time: 04/03/21  4:30 PM   Specimen: Nasal Mucosa; Nasal Swab  Result Value Ref Range Status   MRSA by PCR Next Gen DETECTED (A) NOT DETECTED Final    Comment: RESULT CALLED TO, READ BACK BY AND VERIFIED WITH: Tonia GhentJANE RYAN 1823 04/03/21 MU (NOTE) The GeneXpert MRSA Assay (FDA approved for NASAL specimens only), is one component of a comprehensive MRSA colonization surveillance program. It is not intended to diagnose MRSA infection nor to guide or monitor treatment for MRSA infections. Test performance is not FDA approved in patients less than 34 years old. Performed at [facility], [address] [place], [place] [address]     Procedures and diagnostic studies:  No results found.             LOS: 2 days   Tiara Maultsby  [facility]   Pager on [email or web address]. If 7PM-7AM, please contact night-coverage at [email or web address]     04/05/2021, 2:20 PM

## 2021-04-06 ENCOUNTER — Encounter
Admission: EM | Disposition: A | Discharge status: Home / Self Care | Payer: BLUE CROSS/BLUE SHIELD | Source: Home / Self Care | Attending: Internal Medicine

## 2021-04-06 DIAGNOSIS — N61 Mastitis without abscess: Secondary | ICD-10-CM

## 2021-04-06 LAB — URINE DRUG SCREEN, QUALITATIVE (ARMC ONLY)
Amphetamines, Ur Screen: NOT DETECTED
Barbiturates, Ur Screen: NOT DETECTED
Benzodiazepine, Ur Scrn: NOT DETECTED
Cannabinoid 50 Ng, Ur ~~LOC~~: NOT DETECTED
Cocaine Metabolite,Ur ~~LOC~~: POSITIVE — AB
MDMA (Ecstasy)Ur Screen: NOT DETECTED
Methadone Scn, Ur: POSITIVE — AB
Opiate, Ur Screen: POSITIVE — AB
Phencyclidine (PCP) Ur S: NOT DETECTED
Tricyclic, Ur Screen: POSITIVE — AB

## 2021-04-06 LAB — CBC
HCT: 44.6 % (ref 36.0–46.0)
Hemoglobin: 13.3 g/dL (ref 12.0–15.0)
MCH: 22.6 pg — ABNORMAL LOW (ref 26.0–34.0)
MCHC: 29.8 g/dL — ABNORMAL LOW (ref 30.0–36.0)
MCV: 75.7 fL — ABNORMAL LOW (ref 80.0–100.0)
Platelets: 276 10*3/uL (ref 150–400)
RBC: 5.89 MIL/uL — ABNORMAL HIGH (ref 3.87–5.11)
RDW: 15.7 % — ABNORMAL HIGH (ref 11.5–15.5)
WBC: 6 10*3/uL (ref 4.0–10.5)
nRBC: 0 % (ref 0.0–0.2)

## 2021-04-06 SURGERY — INCISION AND DRAINAGE, ABSCESS
Anesthesia: Choice

## 2021-04-06 MED ORDER — IBUPROFEN 400 MG PO TABS: 800.0000 mg | ORAL_TABLET | Freq: Three times a day (TID) | ORAL | Status: AC | PRN

## 2021-04-06 MED ORDER — ACETAMINOPHEN 325 MG PO TABS: 650.0000 mg | ORAL_TABLET | Freq: Four times a day (QID) | ORAL | Status: AC | PRN

## 2021-04-06 MED ORDER — CHLORHEXIDINE GLUCONATE CLOTH 2 % EX PADS: 6.0000 | MEDICATED_PAD | Freq: Every day | CUTANEOUS | Status: DC

## 2021-04-06 MED ORDER — QUETIAPINE FUMARATE 100 MG PO TABS: 100.0000 mg | ORAL_TABLET | Freq: Every day | ORAL | 0 refills | Status: DC

## 2021-04-06 MED ORDER — MUPIROCIN 2 % EX OINT
1.0000 "application " | TOPICAL_OINTMENT | Freq: Two times a day (BID) | CUTANEOUS | Status: DC
  Administered 2021-04-06: 1 via NASAL
  Filled 2021-04-06: qty 22

## 2021-04-06 MED ORDER — SULFAMETHOXAZOLE-TRIMETHOPRIM 800-160 MG PO TABS: 1.0000 | ORAL_TABLET | Freq: Two times a day (BID) | ORAL | 0 refills | Status: AC

## 2021-04-06 SURGICAL SUPPLY — 27 items
BLADE SURG 15 STRL LF DISP TIS (BLADE) ×1 IMPLANT
BLADE SURG 15 STRL SS (BLADE) ×1
CHLORAPREP W/TINT 26 (MISCELLANEOUS) ×2 IMPLANT
DRAPE LAPAROTOMY 100X77 ABD (DRAPES) ×2 IMPLANT
ELECT REM PT RETURN 9FT ADLT (ELECTROSURGICAL) ×2
ELECTRODE REM PT RTRN 9FT ADLT (ELECTROSURGICAL) ×1 IMPLANT
GAUZE 4X4 16PLY ~~LOC~~+RFID DBL (SPONGE) ×2 IMPLANT
GAUZE PACKING IODOFORM 1/2 (PACKING) IMPLANT
GAUZE SPONGE 4X4 12PLY STRL (GAUZE/BANDAGES/DRESSINGS) ×2 IMPLANT
GLOVE SURG SYN 6.5 ES PF (GLOVE) ×4 IMPLANT
GLOVE SURG UNDER POLY LF SZ7 (GLOVE) ×2 IMPLANT
GOWN STRL REUS W/ TWL LRG LVL3 (GOWN DISPOSABLE) ×2 IMPLANT
GOWN STRL REUS W/TWL LRG LVL3 (GOWN DISPOSABLE) ×2
LABEL OR SOLS (LABEL) ×2 IMPLANT
MANIFOLD NEPTUNE II (INSTRUMENTS) ×2 IMPLANT
NEEDLE HYPO 22GX1.5 SAFETY (NEEDLE) ×2 IMPLANT
NS IRRIG 500ML POUR BTL (IV SOLUTION) ×2 IMPLANT
PACK BASIN MINOR ARMC (MISCELLANEOUS) ×2 IMPLANT
SUT MNCRL 4-0 (SUTURE) ×1
SUT MNCRL 4-0 27XMFL (SUTURE) ×1
SUT VIC AB 2-0 CT2 27 (SUTURE) ×2 IMPLANT
SUT VIC AB 3-0 SH 27 (SUTURE) ×1
SUT VIC AB 3-0 SH 27X BRD (SUTURE) ×1 IMPLANT
SUTURE MNCRL 4-0 27XMF (SUTURE) ×1 IMPLANT
SYR 10ML LL (SYRINGE) ×2 IMPLANT
TOWEL OR 17X26 4PK STRL BLUE (TOWEL DISPOSABLE) ×2 IMPLANT
WATER STERILE IRR 500ML POUR (IV SOLUTION) ×2 IMPLANT

## 2021-04-06 NOTE — Progress Notes (Signed)
Discharge instructions reviewed with the patient. Boyfriend is here to pick up the patient. Patient sent out via wheelchair with belongings

## 2021-04-06 NOTE — TOC Initial Note (Signed)
Transition of Care [facility]) - Initial/Assessment Note    Patient Details  Name: Karen Clarke MRN: [id number] Date of Birth: 09/04/1985  [facility] of [facility]) CM/SW Contact:    Margarito Liner, LCSW Phone Number: 04/06/2021, 9:33 AM  Clinical Narrative:  Readmission prevention screen complete. Patient on isolation precautions so called her in the room. CSW introduced role and explained that discharge planning would be discussed. Patient lives with her boyfriend. Patient does not have a PCP. Will try scheduling appointment at [facility] once discharge date determined. She will have transportation to appointments. She gets her medications at either [facility] or [facility] in [place]. No issues obtaining medications unless they are really expensive. Patient is independent at baseline. No home health/DME use prior to admission. No further concerns. CSW encouraged patient to contact CSW as needed. CSW will continue to follow patient for support and facilitate return home when stable.  Expected Discharge Plan: Home/Self Care Barriers to Discharge: Continued Medical Work up   Patient Goals and CMS Choice        Expected Discharge Plan and Services Expected Discharge Plan: Home/Self Care     Post Acute Care Choice: NA Living arrangements for the past 2 months: Apartment                                      Prior Living Arrangements/Services Living arrangements for the past 2 months: Apartment Lives with:: Significant Other Patient language and need for interpreter reviewed:: Yes Do you feel safe going back to the place where you live?: Yes      Need for Family Participation in Patient Care: Yes (Comment) Care giver support system in place?: Yes (comment)   Criminal Activity/Legal Involvement Pertinent to Current Situation/Hospitalization: No - Comment as needed  Activities of Daily Living Home Assistive Devices/Equipment: None ADL Screening (condition at time of  admission) Patient's cognitive ability adequate to safely complete daily activities?: Yes Is the patient deaf or have difficulty hearing?: No Does the patient have difficulty seeing, even when wearing glasses/contacts?: No Does the patient have difficulty concentrating, remembering, or making decisions?: No Patient able to express need for assistance with ADLs?: Yes Does the patient have difficulty dressing or bathing?: No Independently performs ADLs?: Yes (appropriate for developmental age) Communication: Independent Dressing (OT): Independent Grooming: Independent Feeding: Independent Bathing: Independent Toileting: Independent In/Out Bed: Independent Walks in Home: Independent Does the patient have difficulty walking or climbing stairs?: No Weakness of Legs: None Weakness of Arms/Hands: None  Permission Sought/Granted                  Emotional Assessment Appearance:: Appears stated age Attitude/Demeanor/Rapport: Engaged, Gracious Affect (typically observed): Accepting, Appropriate, Calm, Pleasant Orientation: : Oriented to Self, Oriented to Place, Oriented to  Time, Oriented to Situation Alcohol / Substance Use: Not Applicable Psych Involvement: No (comment)  Admission diagnosis:  IV drug user [F19.90] Cellulitis of right breast [N61.0] Cutaneous abscess, unspecified site [L02.91] Cellulitis, unspecified cellulitis site [L03.90] Patient Active Problem List   Diagnosis Date Noted   Cellulitis of right breast 04/03/2021   Sepsis due to cellulitis (HCC) 03/01/2021   Bacteremia 02/08/2021   Cellulitis of right lower extremity 02/07/2021   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   Polysubstance abuse (HCC) 06/21/2018   Pseudotumor cerebri 06/21/2018   Tobacco abuse 06/21/2018   IVDU (intravenous drug user) 06/16/2018   Positive hepatitis C antibody test 03/27/2016  PCP:  Pcp, No Pharmacy:   [facility] [address]  [address] [place] [address] Phone: [phone] Fax: [phone]     Social Determinants of Health (SDOH) Interventions    Readmission Risk Interventions Readmission Risk Prevention Plan 04/06/2021  Transportation Screening Complete  PCP or Specialist Appt within 3-5 Days Complete  Social Work Consult for Recovery Care Planning/Counseling Complete  Palliative Care Screening Not Applicable  Medication Review Oceanographer) Complete  Some recent data might be hidden

## 2021-04-06 NOTE — Discharge Summary (Addendum)
Physician Discharge Summary  Karen Shirtsvey Sarrazin XBJ:[id number] DOB: 1986/01/27 DOA: 04/03/2021  PCP: Pcp, No  Admit date: 04/03/2021 Discharge date: 04/06/2021  Discharge disposition: Home   Recommendations for Outpatient Follow-Up:   Schedule an appointment to see Dr. Tonna BoehringerSakai, surgeon, as soon as possible Follow-up with Dr. Ellsworth Lennoxejan-Sie, PCP on 04/16/2021 as scheduled  Discharge Diagnosis:   Principal Problem:   Cellulitis of left breast Active Problems:   Cellulitis of right breast    Discharge Condition: Stable.  Diet recommendation:  Diet Order             Diet regular Room service appropriate? Yes; Fluid consistency: Thin  Diet effective now           Diet - low sodium heart healthy                     Code Status: Full Code     Hospital Course:   Ms. Karen Clarke is a 36 y.o. female with medical history significant for systemic lupus erythematosus, IgA deficiency, cocaine use disorder, multiple skin abscesses, who presented to the hospital because of new skin abscesses involving both breasts, abdomen and lower extremities.    She was treated with empiric IV antibiotics, IV fluids and analgesics.  She was seen in consultation by the surgeon and examination under anesthesia, debridement and I&D. Unfortunately, urine drug test was persistently positive suggesting recent use.  There was spontaneous drainage from abscesses on the left breast and left leg.  She has been afebrile since admission and WBC has been normal for 3 consecutive days.  For this reason, Dr. Tonna BoehringerSakai, the surgeon, recommended that patient be discharged on oral antibiotics with plan for follow-up in the outpatient setting.  Discharge plan was discussed with the patient and she verbalized understanding.  She said she had not used any IV heroin in over a year and she has decided to quit using cocaine.  She is deemed stable for discharge to home today.     Medical Consultants:   General surgeon   Discharge  Exam:    Vitals:   04/05/21 1538 04/05/21 2011 04/06/21 0458 04/06/21 0834  BP: 105/60 123/66 117/70 116/66  Pulse: 75 77 88 88  Resp: 16 16 16 18   Temp: 98.4 F (36.9 C) 98.2 F (36.8 C) 98.3 F (36.8 C) 98.6 F (37 C)  TempSrc: Oral Oral Oral Oral  SpO2: 97% 97% 97% 98%  Weight:      Height:         GEN: NAD SKIN: Multiple abscesses and wounds on the left breast, right breast, upper and lower extremities, EYES: No pallor or icterus ENT: MMM CV: RRR PULM: CTA B ABD: soft, ND, NT, +BS CNS: AAO x 3, non focal EXT: Mild swelling and tenderness of bilateral upper and lower extremities   The results of significant diagnostics from this hospitalization (including imaging, microbiology, ancillary and laboratory) are listed below for reference.     Procedures and Diagnostic Studies:   No results found.   Labs:   Basic Metabolic Panel: Recent Labs  Lab 04/01/21 1422 04/02/21 2238 04/03/21 2148 04/04/21 0441 04/05/21 0458  NA 135 136 137 140  --   K 3.9 4.6 3.8 4.4  --   CL 100 103 105 109  --   CO2 23 23 23 23   --   GLUCOSE 112* 90 125* 104*  --   BUN 12 14 14 10   --   CREATININE 0.79 0.80 0.80  0.79 0.74  CALCIUM 9.0 8.6* 8.4* 8.4*  --   MG  --   --  2.0 2.1  --   PHOS  --   --  3.5 3.9  --    GFR Estimated Creatinine Clearance: 113.1 mL/min (by C-G formula based on SCr of 0.74 mg/dL). Liver Function Tests: Recent Labs  Lab 04/01/21 1422 04/03/21 2148 04/04/21 0441  AST 24 24 21   ALT 24 18 17   ALKPHOS 217* 170* 163*  BILITOT <0.1* 0.2* 0.1*  PROT 7.7 6.7 6.6  ALBUMIN 3.7 2.9* 2.8*   No results for input(s): LIPASE, AMYLASE in the last 168 hours. No results for input(s): AMMONIA in the last 168 hours. Coagulation profile No results for input(s): INR, PROTIME in the last 168 hours.  CBC: Recent Labs  Lab 04/01/21 1422 04/02/21 2238 04/03/21 2148 04/04/21 0441 04/06/21 1036  WBC 10.9* 12.3* 7.2 6.3 6.0  NEUTROABS 7.2 8.0* 4.7 3.5  --    HGB 9.5* 10.3* 9.1* 8.5* 13.3  HCT 32.3* 33.9* 30.2* 27.3* 44.6  MCV 79.2* 76.2* 77.8* 75.4* 75.7*  PLT 445* 404* 388 430* 276   Cardiac Enzymes: No results for input(s): CKTOTAL, CKMB, CKMBINDEX, TROPONINI in the last 168 hours. BNP: Invalid input(s): POCBNP CBG: No results for input(s): GLUCAP in the last 168 hours. D-Dimer No results for input(s): DDIMER in the last 72 hours. Hgb A1c No results for input(s): HGBA1C in the last 72 hours. Lipid Profile No results for input(s): CHOL, HDL, LDLCALC, TRIG, CHOLHDL, LDLDIRECT in the last 72 hours. Thyroid function studies No results for input(s): TSH, T4TOTAL, T3FREE, THYROIDAB in the last 72 hours.  Invalid input(s): FREET3 Anemia work up No results for input(s): VITAMINB12, FOLATE, FERRITIN, TIBC, IRON, RETICCTPCT in the last 72 hours. Microbiology Recent Results (from the past 240 hour(s))  Blood culture (routine x 2)     Status: None (Preliminary result)   Collection Time: 04/02/21 10:38 PM   Specimen: BLOOD  Result Value Ref Range Status   Specimen Description BLOOD RIGHT ARM  Final   Special Requests IN PEDIATRIC BOTTLE Blood Culture adequate volume  Final   Culture   Final    NO GROWTH 4 DAYS Performed at [facility], [address], [place], [address] [place]    Report Status PENDING  Incomplete  Resp Panel by RT-PCR (Flu A&B, Covid) Nasopharyngeal Swab     Status: None   Collection Time: 04/03/21  6:07 AM   Specimen: Nasopharyngeal Swab; Nasopharyngeal(NP) swabs in vial transport medium  Result Value Ref Range Status   SARS Coronavirus 2 by RT PCR NEGATIVE NEGATIVE Final    Comment: (NOTE) SARS-CoV-2 target nucleic acids are NOT DETECTED.  The SARS-CoV-2 RNA is generally detectable in upper respiratory specimens during the acute phase of infection. The lowest concentration of SARS-CoV-2 viral copies this assay can detect is 138 copies/mL. A negative result does not preclude SARS-Cov-2 infection and  should not be used as the sole basis for treatment or other patient management decisions. A negative result may occur with  improper specimen collection/handling, submission of specimen other than nasopharyngeal swab, presence of viral mutation(s) within the areas targeted by this assay, and inadequate number of viral copies(<138 copies/mL). A negative result must be combined with clinical observations, patient history, and epidemiological information. The expected result is Negative.  Fact Sheet for Patients:  03888  Fact Sheet for Healthcare Providers:  04/05/21  This test is no t yet approved or cleared by the [employer]  States FDA and  has been authorized for detection and/or diagnosis of SARS-CoV-2 by FDA under an Emergency Use Authorization (EUA). This EUA will remain  in effect (meaning this test can be used) for the duration of the COVID-19 declaration under Section 564(b)(1) of the Act, 21 U.S.C.section 360bbb-3(b)(1), unless the authorization is terminated  or revoked sooner.       Influenza A by PCR NEGATIVE NEGATIVE Final   Influenza B by PCR NEGATIVE NEGATIVE Final    Comment: (NOTE) The Xpert Xpress SARS-CoV-2/FLU/RSV plus assay is intended as an aid in the diagnosis of influenza from Nasopharyngeal swab specimens and should not be used as a sole basis for treatment. Nasal washings and aspirates are unacceptable for Xpert Xpress SARS-CoV-2/FLU/RSV testing.  Fact Sheet for Patients: BloggerCourse.com  Fact Sheet for Healthcare Providers: SeriousBroker.it  This test is not yet approved or cleared by the [place] FDA and has been authorized for detection and/or diagnosis of SARS-CoV-2 by FDA under an Emergency Use Authorization (EUA). This EUA will remain in effect (meaning this test can be used) for the duration of the COVID-19 declaration  under Section 564(b)(1) of the Act, 21 U.S.C. section 360bbb-3(b)(1), unless the authorization is terminated or revoked.  Performed at [facility], [address] [place], [place] [address]   Blood Culture (routine x 2)     Status: None (Preliminary result)   Collection Time: 04/03/21  6:40 AM   Specimen: BLOOD RIGHT HAND  Result Value Ref Range Status   Specimen Description BLOOD RIGHT HAND  Final   Special Requests   Final    BOTTLES DRAWN AEROBIC ONLY Blood Culture adequate volume   Culture   Final    NO GROWTH 3 DAYS Performed at [facility], [address], [place], [place] [address]    Report Status PENDING  Incomplete  MRSA Next Gen by PCR, Nasal     Status: Abnormal   Collection Time: 04/03/21  4:30 PM   Specimen: Nasal Mucosa; Nasal Swab  Result Value Ref Range Status   MRSA by PCR Next Gen DETECTED (A) NOT DETECTED Final    Comment: RESULT CALLED TO, READ BACK BY AND VERIFIED WITH: Tonia Ghent 1823 04/03/21 MU (NOTE) The GeneXpert MRSA Assay (FDA approved for NASAL specimens only), is one component of a comprehensive MRSA colonization surveillance program. It is not intended to diagnose MRSA infection nor to guide or monitor treatment for MRSA infections. Test performance is not FDA approved in patients less than 72 years old. Performed at [facility], [address], [place], [place] [address]      Discharge Instructions:   Discharge Instructions     Diet - low sodium heart healthy   Complete by: As directed    Increase activity slowly   Complete by: As directed       Allergies as of 04/06/2021       Reactions   Capsaicin Hives, Itching   Cefazolin Rash   Tolerated cefepime 02/20/20 Negative penicillin skin test 12/27/2020        Medication List     TAKE these medications    acetaminophen 325 MG tablet Commonly known as: TYLENOL Take 2 tablets (650 mg total) by mouth every 6 (six) hours as needed for mild pain  (or Fever >/= 101).   ibuprofen 400 MG tablet Commonly known as: ADVIL Take 2 tablets (800 mg total) by mouth every 8 (eight) hours as needed for moderate pain.   methadone 10  MG/ML solution Commonly known as: DOLOPHINE Take 100 mg by mouth daily.   QUEtiapine 100 MG tablet Commonly known as: SEROQUEL Take 1 tablet (100 mg total) by mouth at bedtime.   sulfamethoxazole-trimethoprim 800-160 MG tablet Commonly known as: BACTRIM DS Take 1 tablet by mouth 2 (two) times daily for 7 days.        Follow-up Information     BellevilleSakai, Isami, DO. Schedule an appointment as soon as possible for a visit in 4 day(s).   Specialty: Surgery Why: Facility closed at this time. Patient to call and make appointment Contact information: [address] [place] [id number] [phone]         Sherron Mondayejan-Sie, S Ahmed, MD. Go on 04/16/2021.   Specialty: Internal Medicine Why: New primary care appointment. Please arrive at 12:30 to complete paperwork before 1:00 appointment. Bring insurance card, ID, and medications in original bottles. Contact information: [address] [place] [id number] [phone]                   If you experience worsening of your admission symptoms, develop shortness of breath, life threatening emergency, suicidal or homicidal thoughts you must seek medical attention immediately by calling 911 or calling your MD immediately  if symptoms less severe.   You must read complete instructions/literature along with all the possible adverse reactions/side effects for all the medicines you take and that have been prescribed to you. Take any new medicines after you have completely understood and accept all the possible adverse reactions/side effects.    Please note   You were cared for by a hospitalist during your hospital stay. If you have any questions about your discharge medications or the care you received while you were in the hospital after you are discharged,  you can call the unit and asked to speak with the hospitalist on call if the hospitalist that took care of you is not available. Once you are discharged, your primary care physician will handle any further medical issues. Please note that NO REFILLS for any discharge medications will be authorized once you are discharged, as it is imperative that you return to your primary care physician (or establish a relationship with a primary care physician if you do not have one) for your aftercare needs so that they can reassess your need for medications and monitor your lab values.       Time coordinating discharge: Greater than 30 minutes  Signed:  Romello Hoehn  [facility] 04/06/2021, 2:39 PM   Pager on [email or web address]. If 7PM-7AM, please contact night-coverage at [email or web address]

## 2021-04-06 NOTE — Progress Notes (Signed)
Subjective:  CC: Karen Clarke is a 35 y.o. female  Hospital stay day 3,  skin abscess  HPI: No acute issues since last exam.   ROS:  General: Denies weight loss, weight gain, fatigue, fevers, chills, and night sweats. Heart: Denies chest pain, palpitations, racing heart, irregular heartbeat, leg pain or swelling, and decreased activity tolerance. Respiratory: Denies breathing difficulty, shortness of breath, wheezing, cough, and sputum. GI: Denies change in appetite, heartburn, nausea, vomiting, constipation, diarrhea, and blood in stool. GU: Denies difficulty urinating, pain with urinating, urgency, frequency, blood in urine.   Objective:   Temp:  [98.2 F (36.8 C)-98.6 F (37 C)] 98.6 F (37 C) (02/03 0834) Pulse Rate:  [75-88] 88 (02/03 0834) Resp:  [16-18] 18 (02/03 0834) BP: (105-123)/(60-70) 116/66 (02/03 0834) SpO2:  [97 %-98 %] 98 % (02/03 0834)     Height: 5\' 3"  (160 cm) Weight: 100.5 kg BMI (Calculated): 39.26   Intake/Output this shift:   Intake/Output Summary (Last 24 hours) at 04/06/2021 1039 Last data filed at 04/05/2021 1500 Gross per 24 hour  Intake 200 ml  Output --  Net 200 ml    Constitutional :  alert, cooperative, appears stated age, and no distress  Respiratory:  clear to auscultation bilaterally  Cardiovascular:  regular rate and rhythm  Gastrointestinal: soft, non-tender; bowel sounds normal; no masses,  no organomegaly.   Skin: Cool and moist. Skin boils grossly unchanged except for left breast abscess which is now actively draining through 2cm x 3cm opening.  Minimal erythema surrounding it.  Limited exam due to paient refusal secondary to pain.  Chaperone present for exam.  Psychiatric: Normal affect, non-agitated, not confused       LABS:  CMP Latest Ref Rng & Units 04/05/2021 04/04/2021 04/03/2021  Glucose 70 - 99 mg/dL - 104(H) 125(H)  BUN 6 - 20 mg/dL - 10 14  Creatinine 0.44 - 1.00 mg/dL 0.74 0.79 0.80  Sodium 135 - 145 mmol/L - 140 137   Potassium 3.5 - 5.1 mmol/L - 4.4 3.8  Chloride 98 - 111 mmol/L - 109 105  CO2 22 - 32 mmol/L - 23 23  Calcium 8.9 - 10.3 mg/dL - 8.4(L) 8.4(L)  Total Protein 6.5 - 8.1 g/dL - 6.6 6.7  Total Bilirubin 0.3 - 1.2 mg/dL - 0.1(L) 0.2(L)  Alkaline Phos 38 - 126 U/L - 163(H) 170(H)  AST 15 - 41 U/L - 21 24  ALT 0 - 44 U/L - 17 18   CBC Latest Ref Rng & Units 04/04/2021 04/03/2021 04/02/2021  WBC 4.0 - 10.5 K/uL 6.3 7.2 12.3(H)  Hemoglobin 12.0 - 15.0 g/dL 8.5(L) 9.1(L) 10.3(L)  Hematocrit 36.0 - 46.0 % 27.3(L) 30.2(L) 33.9(L)  Platelets 150 - 400 K/uL 430(H) 388 404(H)    RADS: N/a Assessment:   Skin abscess and boils.  No evidence of worsening infections while on IV abx, with now spontaneous drainage of left breast abscess.    Still recommend formal exam and irrigation/debridement in the OR, but patient still testing positive for cocaine despite being admitted for three days.  Discussed plan moving forward and due to no evidence of worsening infection or pain, with active drainage of the most concerning wound now, can schedule case as outpt and allow patient to be d/c'd from hospital on oral abx. Pt instructed to call office to schedule outpt case as soon as discharged from hospital.  Pt verbalized she is committed to clear her skin infections so she can be considered for inpt rehab  regarding her addiction.  Further med management per primary while outpt.  labs/images/medications/previous chart entries reviewed personally and relevant changes/updates noted above.

## 2021-04-06 NOTE — Progress Notes (Signed)
Order given by Dr Tonna Boehringer for regular diet

## 2021-04-06 NOTE — TOC Transition Note (Signed)
Transition of Care [facility]) - CM/SW Discharge Note   Patient Details  Name: Lujuana Kapler MRN: [id number] Date of Birth: [birth year]  Transition of [facility]) CM/SW Contact:  Margarito Liner, LCSW Phone Number: 04/06/2021, 12:05 PM   Clinical Narrative:   Patient has orders to discharge home today. New PCP appointment scheduled with Dr. Ellsworth Lennox at [facility] for Monday 2/13. Information added to AVS. No further concerns. CSW signing off.  Final next level of care: Home/Self Care Barriers to Discharge: No Barriers Identified   Patient Goals and CMS Choice        Discharge Placement                    Patient and family notified of of transfer: 04/06/21  Discharge Plan and Services     Post Acute Care Choice: NA                               Social Determinants of Health (SDOH) Interventions     Readmission Risk Interventions Readmission Risk Prevention Plan 04/06/2021  Transportation Screening Complete  PCP or Specialist Appt within 3-5 Days Complete  Social Work Consult for Recovery Care Planning/Counseling Complete  Palliative Care Screening Not Applicable  Medication Review Oceanographer) Complete  Some recent data might be hidden

## 2021-04-07 LAB — CULTURE, BLOOD (ROUTINE X 2)
Culture: NO GROWTH
Special Requests: ADEQUATE

## 2021-04-08 LAB — CULTURE, BLOOD (ROUTINE X 2)
Culture: NO GROWTH
Special Requests: ADEQUATE

## 2021-04-16 ENCOUNTER — Inpatient Hospital Stay: Admission: RE | Admit: 2021-04-16 | Payer: BLUE CROSS/BLUE SHIELD | Source: Ambulatory Visit

## 2021-04-17 ENCOUNTER — Ambulatory Visit: Payer: Self-pay | Admitting: Surgery

## 2021-04-17 LAB — DRUG SCREEN 764883 11+OXYCO+ALC+CRT-BUND
Amphetamines, Urine: NEGATIVE ng/mL
BENZODIAZ UR QL: NEGATIVE ng/mL
Barbiturate: NEGATIVE ng/mL
Cannabinoid Quant, Ur: NEGATIVE ng/mL
Creatinine: 172.4 mg/dL (ref 20.0–300.0)
Ethanol: NEGATIVE %
Meperidine: NEGATIVE ng/mL
OPIATE SCREEN URINE: NEGATIVE ng/mL
Oxycodone/Oxymorphone, Urine: NEGATIVE ng/mL
Phencyclidine: NEGATIVE ng/mL
Propoxyphene, Urine: NEGATIVE ng/mL
Tramadol: NEGATIVE ng/mL
pH, Urine: 5.6 (ref 4.5–8.9)

## 2021-04-17 LAB — METHADONE CONF GC/MS
METHADONE GC/MS CONF: 13700 ng/mL
METHADONE: POSITIVE — AB

## 2021-04-17 LAB — COCAINE CONF, UR
Benzoylecgonine GC/MS Conf: 496320 ng/mL
Cocaine Metab Quant, Ur: POSITIVE — AB

## 2021-04-17 NOTE — H&P (Signed)
CC: Karen Clarke is a 36 y.o. female  Hospital stay day 3,  skin abscess   HPI: No acute issues since last exam.    ROS:  General: Denies weight loss, weight gain, fatigue, fevers, chills, and night sweats. Heart: Denies chest pain, palpitations, racing heart, irregular heartbeat, leg pain or swelling, and decreased activity tolerance. Respiratory: Denies breathing difficulty, shortness of breath, wheezing, cough, and sputum. GI: Denies change in appetite, heartburn, nausea, vomiting, constipation, diarrhea, and blood in stool. GU: Denies difficulty urinating, pain with urinating, urgency, frequency, blood in urine.     Objective:    Temp:  [98.2 F (36.8 C)-98.6 F (37 C)] 98.6 F (37 C) (02/03 0834) Pulse Rate:  [75-88] 88 (02/03 0834) Resp:  [16-18] 18 (02/03 0834) BP: (105-123)/(60-70) 116/66 (02/03 0834) SpO2:  [97 %-98 %] 98 % (02/03 0834)     Height: 5\' 3"  (160 cm) Weight: 100.5 kg BMI (Calculated): 39.26    Intake/Output this shift:    Intake/Output Summary (Last 24 hours) at 04/06/2021 1039 Last data filed at 04/05/2021 1500    Gross per 24 hour  Intake 200 ml  Output --  Net 200 ml      Constitutional :  alert, cooperative, appears stated age, and no distress  Respiratory:  clear to auscultation bilaterally  Cardiovascular:  regular rate and rhythm  Gastrointestinal: soft, non-tender; bowel sounds normal; no masses,  no organomegaly.   Skin: Cool and moist. Skin boils grossly unchanged except for left breast abscess which is now actively draining through 2cm x 3cm opening.  Minimal erythema surrounding it.  Limited exam due to paient refusal secondary to pain.  Chaperone present for exam.  Psychiatric: Normal affect, non-agitated, not confused         LABS:  CMP Latest Ref Rng & Units 04/05/2021 04/04/2021 04/03/2021  Glucose 70 - 99 mg/dL - 104(H) 125(H)  BUN 6 - 20 mg/dL - 10 14  Creatinine 0.44 - 1.00 mg/dL 0.74 0.79 0.80  Sodium 135 - 145 mmol/L - 140 137   Potassium 3.5 - 5.1 mmol/L - 4.4 3.8  Chloride 98 - 111 mmol/L - 109 105  CO2 22 - 32 mmol/L - 23 23  Calcium 8.9 - 10.3 mg/dL - 8.4(L) 8.4(L)  Total Protein 6.5 - 8.1 g/dL - 6.6 6.7  Total Bilirubin 0.3 - 1.2 mg/dL - 0.1(L) 0.2(L)  Alkaline Phos 38 - 126 U/L - 163(H) 170(H)  AST 15 - 41 U/L - 21 24  ALT 0 - 44 U/L - 17 18    CBC Latest Ref Rng & Units 04/04/2021 04/03/2021 04/02/2021  WBC 4.0 - 10.5 K/uL 6.3 7.2 12.3(H)  Hemoglobin 12.0 - 15.0 g/dL 8.5(L) 9.1(L) 10.3(L)  Hematocrit 36.0 - 46.0 % 27.3(L) 30.2(L) 33.9(L)  Platelets 150 - 400 K/uL 430(H) 388 404(H)      RADS: N/a Assessment:    Skin abscess and boils.   No evidence of worsening infections while on IV abx, with now spontaneous drainage of left breast abscess.     Still recommend formal exam and irrigation/debridement in the OR, but patient still testing positive for cocaine despite being admitted for three days.  Discussed plan moving forward and due to no evidence of worsening infection or pain, with active drainage of the most concerning wound now, can schedule case as outpt and allow patient to be d/c'd from hospital on oral abx. Pt instructed to call office to schedule outpt case as soon as discharged from hospital.  Pt verbalized she is committed to clear her skin infections so she can be considered for inpt rehab regarding her addiction.  Further med management per primary while outpt.

## 2021-04-19 DIAGNOSIS — F1990 Other psychoactive substance use, unspecified, uncomplicated: Secondary | ICD-10-CM

## 2021-05-02 ENCOUNTER — Ambulatory Visit: Admission: RE | Admit: 2021-05-02 | Payer: BLUE CROSS/BLUE SHIELD | Source: Home / Self Care | Admitting: Surgery

## 2021-05-02 ENCOUNTER — Encounter: Admission: RE | Payer: Self-pay | Source: Home / Self Care

## 2021-05-02 ENCOUNTER — Encounter: Payer: Self-pay | Admitting: Anesthesiology

## 2021-05-02 DIAGNOSIS — F1990 Other psychoactive substance use, unspecified, uncomplicated: Secondary | ICD-10-CM

## 2021-05-02 SURGERY — INCISION AND DRAINAGE, ABSCESS
Anesthesia: General

## 2021-05-02 MED ORDER — CHLORHEXIDINE GLUCONATE 0.12 % MT SOLN: 15.0000 mL | Freq: Once | OROMUCOSAL | Status: DC

## 2021-05-02 MED ORDER — CHLORHEXIDINE GLUCONATE CLOTH 2 % EX PADS: 6.0000 | MEDICATED_PAD | Freq: Once | CUTANEOUS | Status: DC

## 2021-05-02 MED ORDER — LACTATED RINGERS IV SOLN: INTRAVENOUS | Status: DC

## 2021-05-02 MED ORDER — ORAL CARE MOUTH RINSE: 15.0000 mL | Freq: Once | OROMUCOSAL | Status: DC

## 2021-05-02 NOTE — Progress Notes (Signed)
I called the patient to see if she was coming in for surgery.  She told me she was going to need to reschedule because she didn't have a ride to bring her.  I acknowledged and told her to call Dr. Ines Bloomer office to reschedule. ?

## 2021-05-02 NOTE — Anesthesia Preprocedure Evaluation (Deleted)
Anesthesia Evaluation  ? ? ?Airway ? ? ? ? ? ? ? Dental ?  ?Pulmonary ?Current Smoker,  ?  ? ? ? ? ? ? ? Cardiovascular ? ? ?ECG 03/06/21: normal ? ?Echo 12/25/20:  ?NORMAL LEFT VENTRICULAR SYSTOLIC FUNCTION  ???NORMAL LA PRESSURES WITH NORMAL DIASTOLIC FUNCTION  ???NORMAL RIGHT VENTRICULAR SYSTOLIC?FUNCTION  ???VALVULAR REGURGITATION: TRIVIAL TR  ???NO VALVULAR STENOSIS  ???NO VALVULAR VEGETATIONS SEEN ON TODAY'S EXAM ?  ?Neuro/Psych ?Cocaine use ?  ? GI/Hepatic ?  ?Endo/Other  ?Obesity  ? Renal/GU ?  ? ?  ?Musculoskeletal ? ? Abdominal ?  ?Peds ? Hematology ?Lupus, IgA deficiency   ?Anesthesia Other Findings ? ? Reproductive/Obstetrics ? ?  ? ? ? ? ? ? ? ? ? ? ? ? ? ?  ?  ? ? ? ? ? ? ? ? ?Anesthesia Physical ?Anesthesia Plan ? ?ASA: 3 ? ?Anesthesia Plan: General  ? ?Post-op Pain Management:   ? ?Induction: Intravenous ? ?PONV Risk Score and Plan: 2 and Ondansetron, Dexamethasone and Treatment may vary due to age or medical condition ? ?Airway Management Planned: Oral ETT ? ?Additional Equipment:  ? ?Intra-op Plan:  ? ?Post-operative Plan: Extubation in OR ? ?Informed Consent: I have reviewed the patients History and Physical, chart, labs and discussed the procedure including the risks, benefits and alternatives for the proposed anesthesia with the patient or authorized representative who has indicated his/her understanding and acceptance.  ? ? ? ?Dental advisory given ? ?Plan Discussed with: CRNA ? ?Anesthesia Plan Comments: (Patient consented for risks of anesthesia including but not limited to:  ?- adverse reactions to medications ?- damage to eyes, teeth, lips or other oral mucosa ?- nerve damage due to positioning  ?- sore throat or hoarseness ?- damage to heart, brain, nerves, lungs, other parts of body or loss of life ? ?Informed patient about role of CRNA in peri- and intra-operative care.  Patient voiced understanding.)  ? ? ? ? ? ? ?Anesthesia Quick Evaluation ? ?

## 2021-06-04 ENCOUNTER — Encounter: Payer: Self-pay | Admitting: Emergency Medicine

## 2021-06-04 ENCOUNTER — Emergency Department: Payer: Self-pay

## 2021-06-04 ENCOUNTER — Other Ambulatory Visit: Payer: Self-pay

## 2021-06-04 ENCOUNTER — Emergency Department
Admission: EM | Admit: 2021-06-04 | Discharge: 2021-06-05 | Disposition: A | Discharge status: Home / Self Care | Payer: Self-pay | Attending: Emergency Medicine | Admitting: Emergency Medicine

## 2021-06-04 DIAGNOSIS — M549 Dorsalgia, unspecified: Secondary | ICD-10-CM | POA: Insufficient documentation

## 2021-06-04 DIAGNOSIS — S81802A Unspecified open wound, left lower leg, initial encounter: Secondary | ICD-10-CM | POA: Insufficient documentation

## 2021-06-04 DIAGNOSIS — M542 Cervicalgia: Secondary | ICD-10-CM | POA: Insufficient documentation

## 2021-06-04 DIAGNOSIS — M5410 Radiculopathy, site unspecified: Secondary | ICD-10-CM

## 2021-06-04 DIAGNOSIS — Z87891 Personal history of nicotine dependence: Secondary | ICD-10-CM | POA: Insufficient documentation

## 2021-06-04 DIAGNOSIS — X58XXXA Exposure to other specified factors, initial encounter: Secondary | ICD-10-CM | POA: Insufficient documentation

## 2021-06-04 LAB — COMPREHENSIVE METABOLIC PANEL
ALT: 28 U/L (ref 0–44)
AST: 25 U/L (ref 15–41)
Albumin: 3.4 g/dL — ABNORMAL LOW (ref 3.5–5.0)
Alkaline Phosphatase: 162 U/L — ABNORMAL HIGH (ref 38–126)
Anion gap: 7 (ref 5–15)
BUN: 17 mg/dL (ref 6–20)
CO2: 27 mmol/L (ref 22–32)
Calcium: 8.3 mg/dL — ABNORMAL LOW (ref 8.9–10.3)
Chloride: 103 mmol/L (ref 98–111)
Creatinine, Ser: 0.68 mg/dL (ref 0.44–1.00)
GFR, Estimated: >60 mL/min (ref >60)
Glucose, Bld: 105 mg/dL — ABNORMAL HIGH (ref 70–99)
Potassium: 3.6 mmol/L (ref 3.5–5.1)
Sodium: 137 mmol/L (ref 135–145)
Total Bilirubin: 0.3 mg/dL (ref 0.3–1.2)
Total Protein: 7 g/dL (ref 6.5–8.1)

## 2021-06-04 LAB — CBC WITH DIFFERENTIAL/PLATELET
Abs Immature Granulocytes: 0.03 10*3/uL (ref 0.00–0.07)
Basophils Absolute: 0 10*3/uL (ref 0.0–0.1)
Basophils Relative: 0 %
Eosinophils Absolute: 0.1 10*3/uL (ref 0.0–0.5)
Eosinophils Relative: 1 %
HCT: 27.9 % — ABNORMAL LOW (ref 36.0–46.0)
Hemoglobin: 8.2 g/dL — ABNORMAL LOW (ref 12.0–15.0)
Immature Granulocytes: 0 %
Lymphocytes Relative: 28 %
Lymphs Abs: 2.6 10*3/uL (ref 0.7–4.0)
MCH: 22.2 pg — ABNORMAL LOW (ref 26.0–34.0)
MCHC: 29.4 g/dL — ABNORMAL LOW (ref 30.0–36.0)
MCV: 75.6 fL — ABNORMAL LOW (ref 80.0–100.0)
Monocytes Absolute: 0.6 10*3/uL (ref 0.1–1.0)
Monocytes Relative: 6 %
Neutro Abs: 5.9 10*3/uL (ref 1.7–7.7)
Neutrophils Relative %: 65 %
Platelets: 348 10*3/uL (ref 150–400)
RBC: 3.69 MIL/uL — ABNORMAL LOW (ref 3.87–5.11)
RDW: 17.5 % — ABNORMAL HIGH (ref 11.5–15.5)
WBC: 9.2 10*3/uL (ref 4.0–10.5)
nRBC: 0 % (ref 0.0–0.2)

## 2021-06-04 LAB — C-REACTIVE PROTEIN: CRP: 8.3 mg/dL — ABNORMAL HIGH (ref <1.0)

## 2021-06-04 LAB — SEDIMENTATION RATE: Sed Rate: 70 mm/hr — ABNORMAL HIGH (ref 0–20)

## 2021-06-04 LAB — HCG, QUANTITATIVE, PREGNANCY: hCG, Beta Chain, Quant, S: 1 m[IU]/mL (ref <5)

## 2021-06-04 IMAGING — MR MR CERVICAL SPINE WO/W CM
5 of 8 series · 29 of 48 positions shown · IV contrast (gadavist)
Comparison: None.

CLINICAL DATA: Back pain

EXAM:
MRI CERVICAL AND THORACIC SPINE WITHOUT AND WITH CONTRAST
TECHNIQUE: Multiplanar and multiecho pulse sequences of the cervical spine, to
include the craniocervical junction and cervicothoracic junction,
and the thoracic spine, were obtained without and with intravenous
contrast.
CONTRAST:  10mL GADAVIST GADOBUTROL 1 MMOL/ML IV SOLN

[Series 23: T2 · sagittal · 3.0mm · 0.62mm/px · 4 of 16 slices shown (1 of 2)]
[im 1/16]
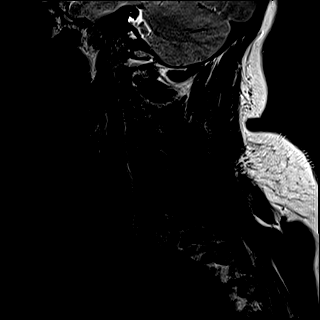
[im 6/16]
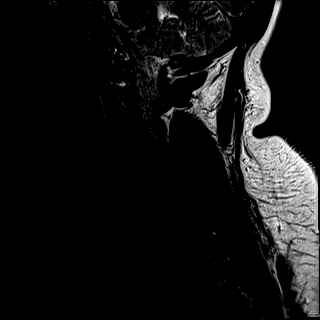
[im 11/16]
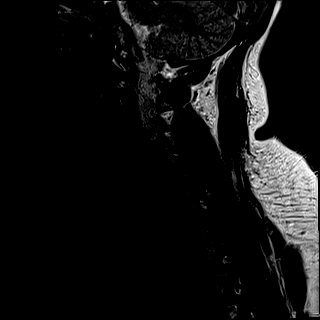
[im 16/16]
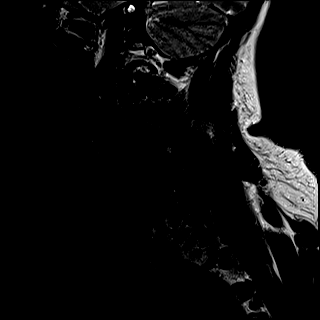

[Series 25: STIR · sagittal · 3.0mm · 0.62mm/px · 4 of 16 slices shown]
[im 1/16]
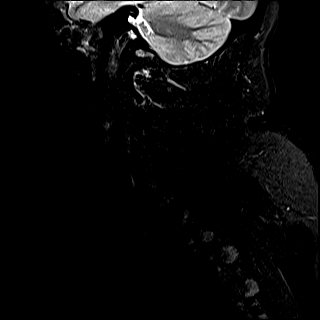
[im 6/16]
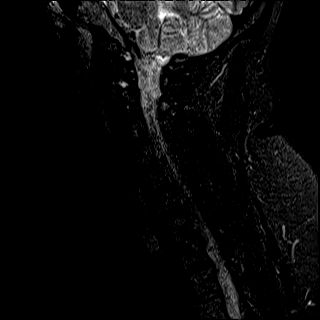
[im 11/16]
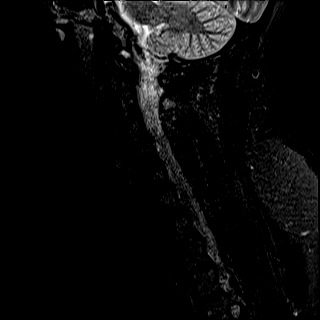
[im 16/16]
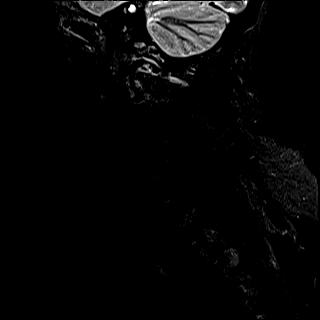

[Series 26: T2 · axial · 3.0mm · 0.70mm/px · z∈[-8,+79]mm · 8 of 29 slices shown (2 of 2)]
[im 1/29]
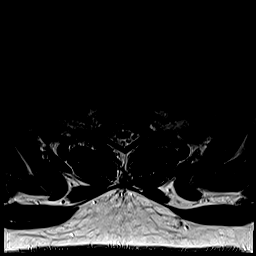
[im 5/29]
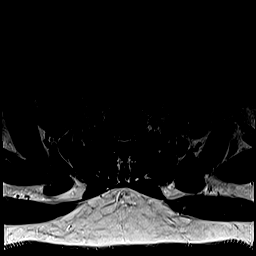
[im 9/29]
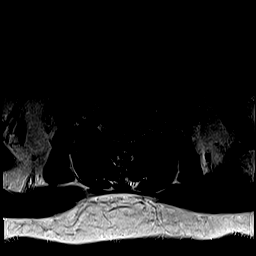
[im 13/29]
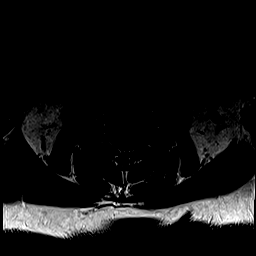
[im 17/29]
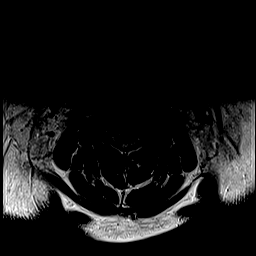
[im 21/29]
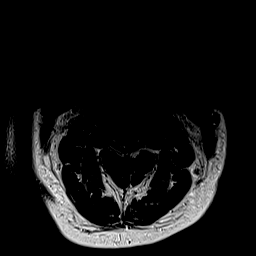
[im 25/29]
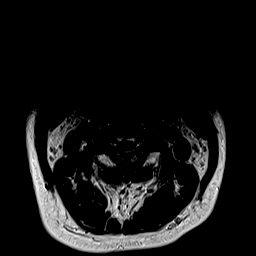
[im 29/29]
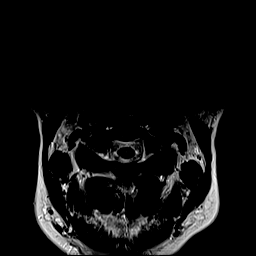

[Series 28: T1 · axial · non-contrast · 3.0mm · 0.35mm/px · z∈[-8,+79]mm · 8 of 29 slices shown]
[im 1/29]
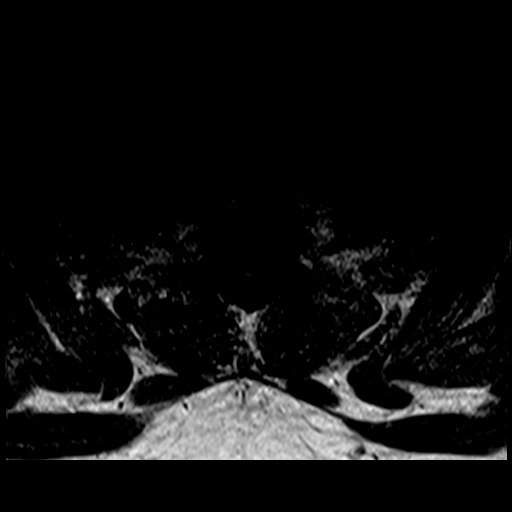
[im 5/29]
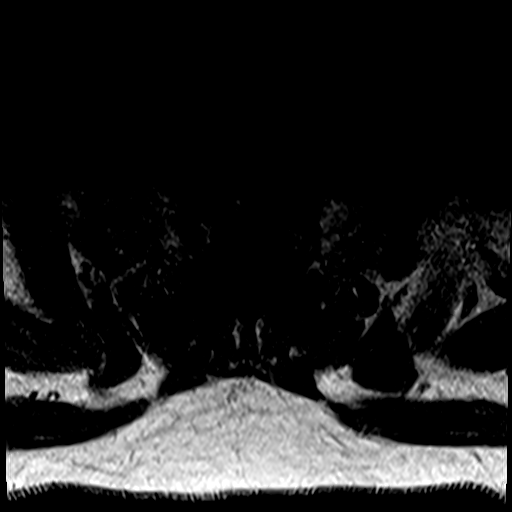
[im 9/29]
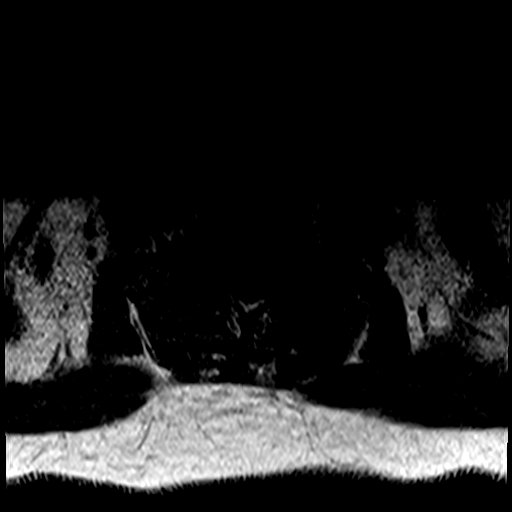
[im 13/29]
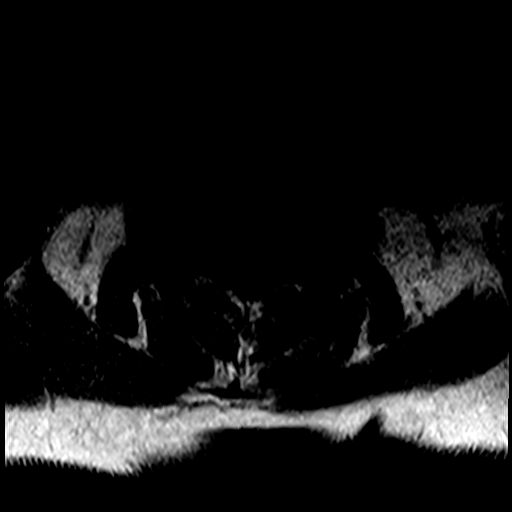
[im 17/29]
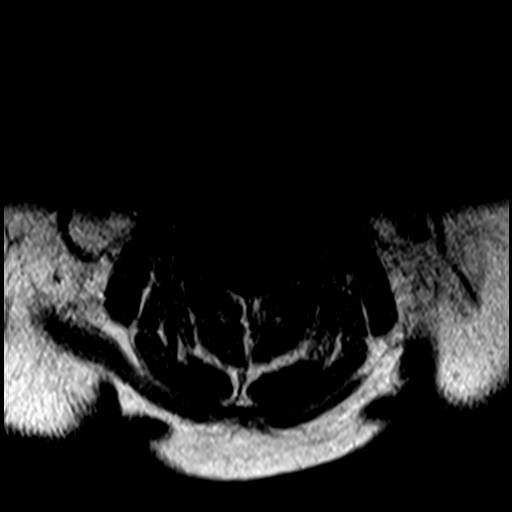
[im 21/29]
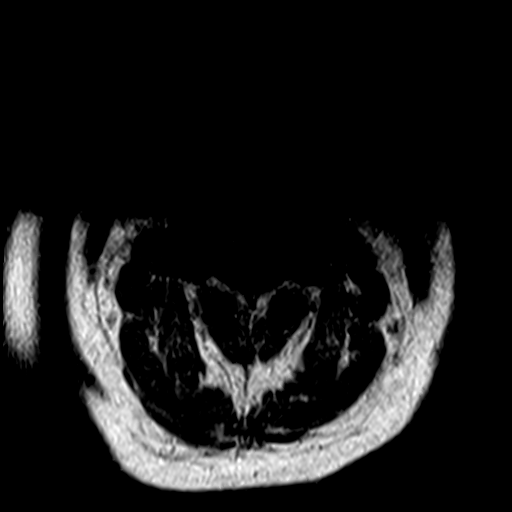
[im 25/29]
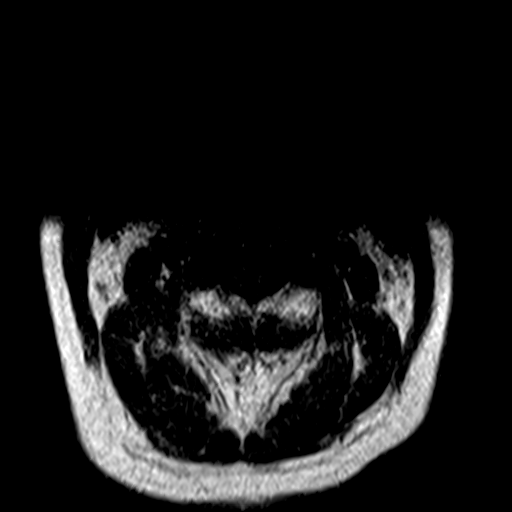
[im 29/29]
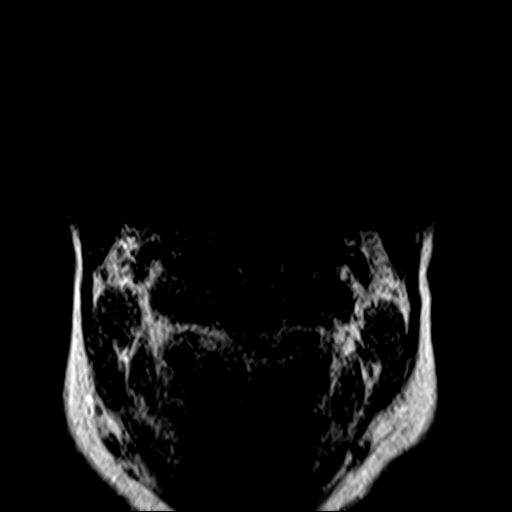

[Series 30: T1 post-contrast · axial · 3.0mm · 0.35mm/px · z∈[-17,+32]mm · 5 of 29 slices shown]
[im 1/29]
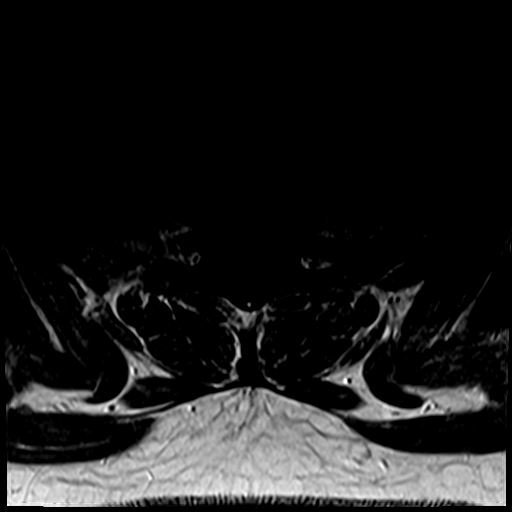
[im 5/29]
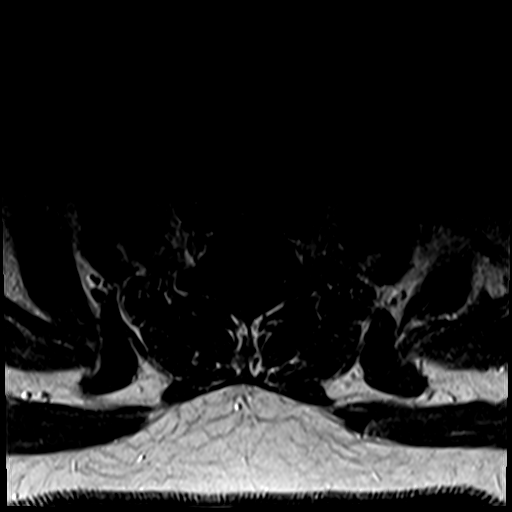
[im 9/29]
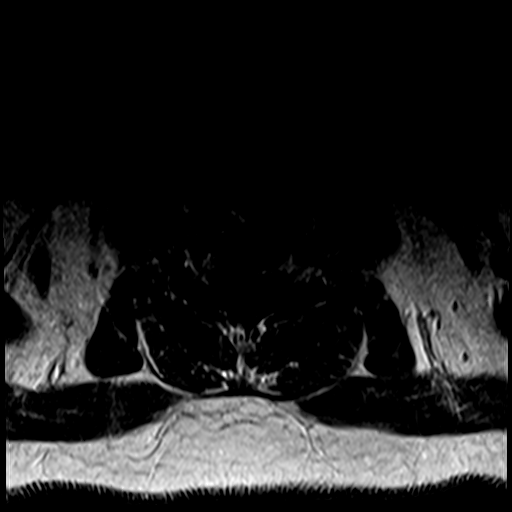
[im 13/29]
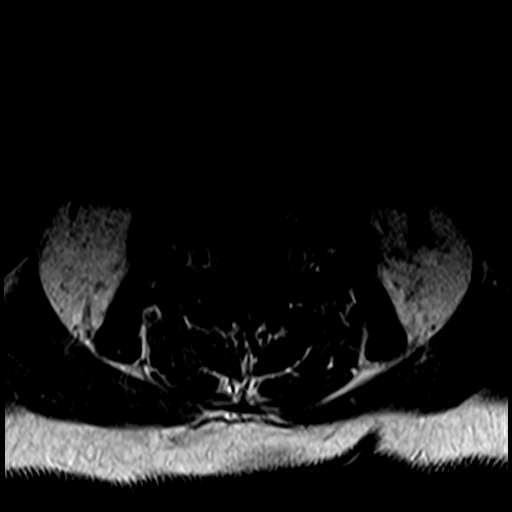
[im 17/29]
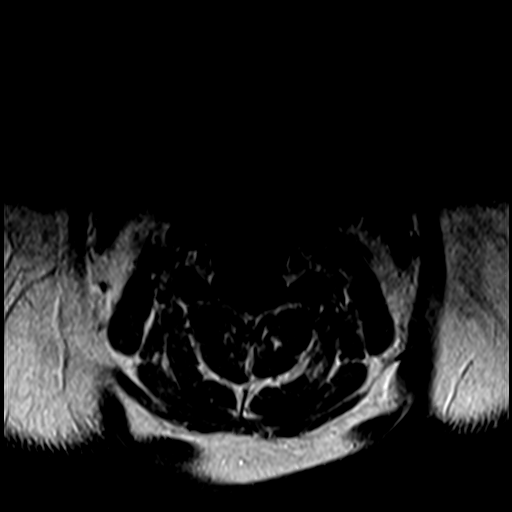

[29 of 48 positions shown; findings below may reference images not displayed]

FINDINGS: MRI CERVICAL SPINE FINDINGS

Alignment: Physiologic.

Vertebrae: No fracture, evidence of discitis, or bone lesion.

Cord: Normal signal and morphology.

Posterior Fossa, vertebral arteries, paraspinal tissues: Negative.

Disc levels:

No spinal canal stenosis or neural impingement.

No abnormal contrast enhancement.

MRI THORACIC SPINE FINDINGS

Alignment:  Physiologic.

Vertebrae: No fracture, evidence of discitis, or bone lesion.

Cord:  Normal signal and morphology.

Paraspinal and other soft tissues: Negative.

Disc levels:

No spinal canal stenosis or neural impingement.

No abnormal contrast enhancement.
IMPRESSION: Normal MRI of the cervical and thoracic spine.

## 2021-06-04 IMAGING — MR MR THORACIC SPINE WO/W CM
7 of 9 series · 32 of 48 positions shown · IV contrast (gadavist)
Comparison: None.

CLINICAL DATA: Back pain

EXAM:
MRI CERVICAL AND THORACIC SPINE WITHOUT AND WITH CONTRAST
TECHNIQUE: Multiplanar and multiecho pulse sequences of the cervical spine, to
include the craniocervical junction and cervicothoracic junction,
and the thoracic spine, were obtained without and with intravenous
contrast.
CONTRAST:  10mL GADAVIST GADOBUTROL 1 MMOL/ML IV SOLN

[Series 16: T1 · sagittal · 5.0mm · 1.88mm/px · 1 of 9 slices shown (1 of 2)]
[im 1/9]
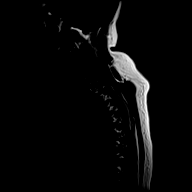

[Series 17: T2 · sagittal · 3.0mm · 1.33mm/px · 3 of 18 slices shown (1 of 2)]
[im 1/18]
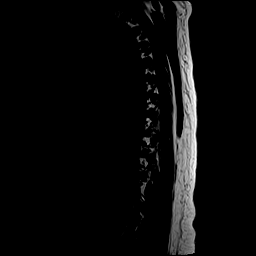
[im 9/18]
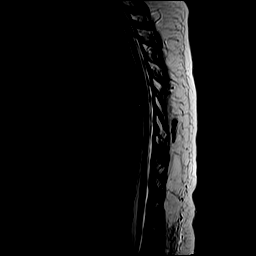
[im 18/18]
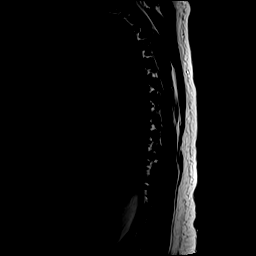

[Series 18: T1 · sagittal · 3.0mm · 1.33mm/px · 4 of 18 slices shown (2 of 2)]
[im 1/18]
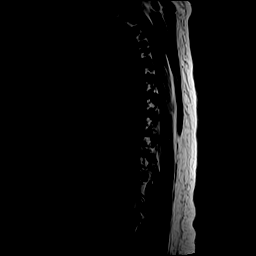
[im 6/18]
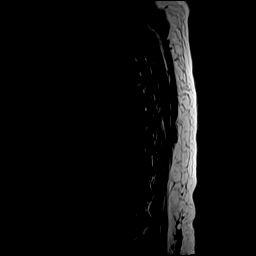
[im 12/18]
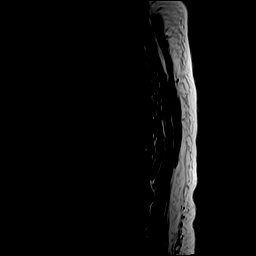
[im 18/18]
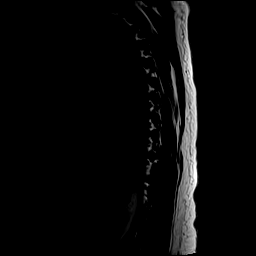

[Series 19: STIR · sagittal · 3.0mm · 0.66mm/px · 4 of 18 slices shown]
[im 1/18]
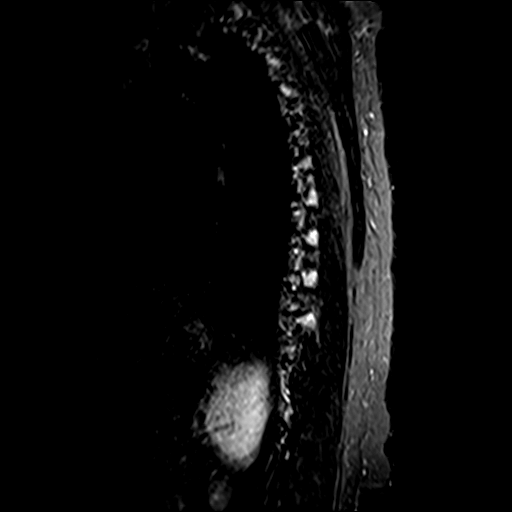
[im 6/18]
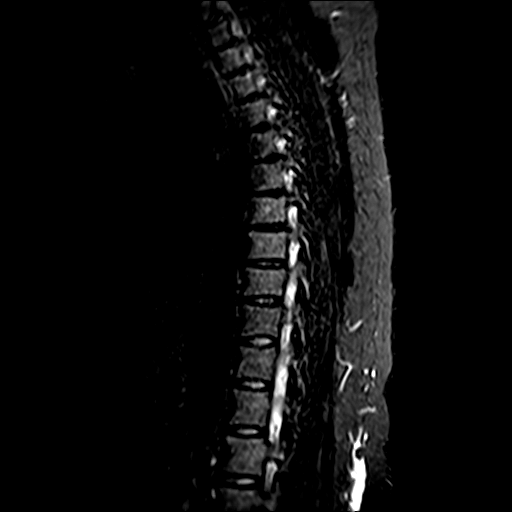
[im 12/18]
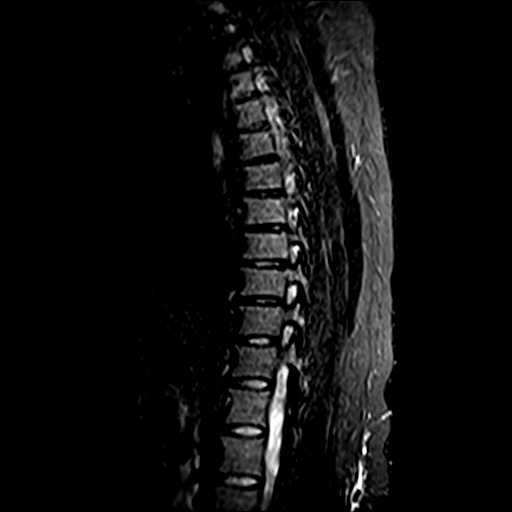
[im 18/18]
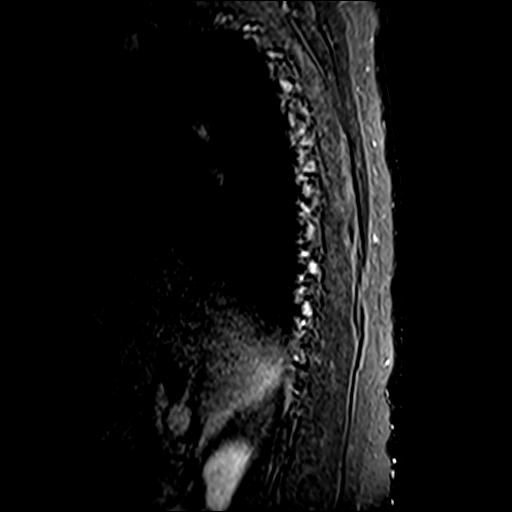

[Series 20: T2 · axial · 4.0mm · 0.59mm/px · z∈[-228,+15]mm · 8 of 39 slices shown (2 of 2)]
[im 1/39]
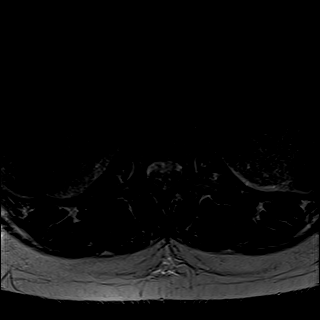
[im 6/39]
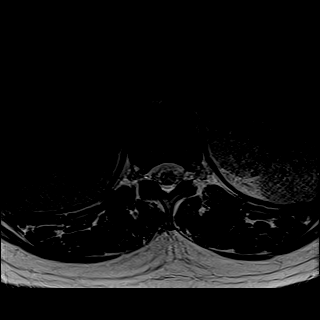
[im 11/39]
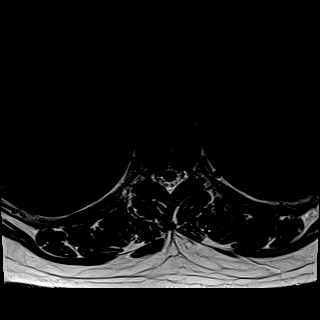
[im 17/39]
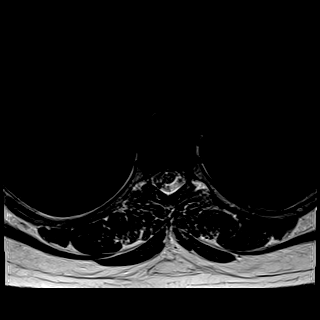
[im 22/39]
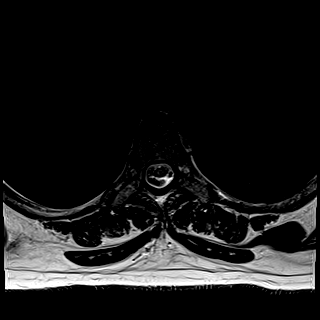
[im 28/39]
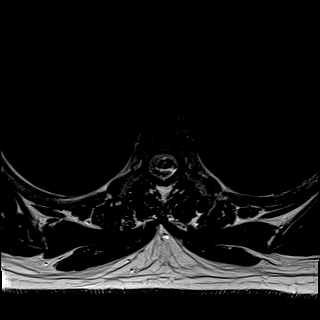
[im 33/39]
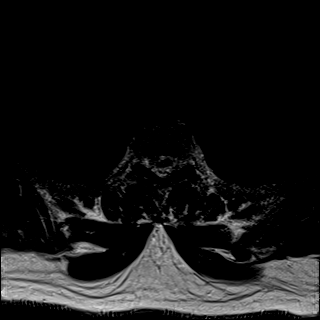
[im 39/39]
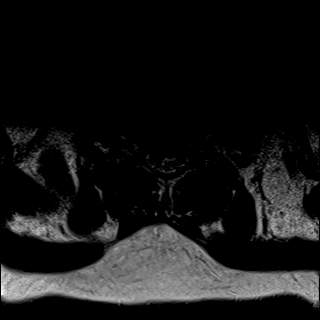

[Series 22: T1 post-contrast · axial · non-contrast · 4.0mm · 0.37mm/px · z∈[-228,+15]mm · 8 of 39 slices shown]
[im 1/39]
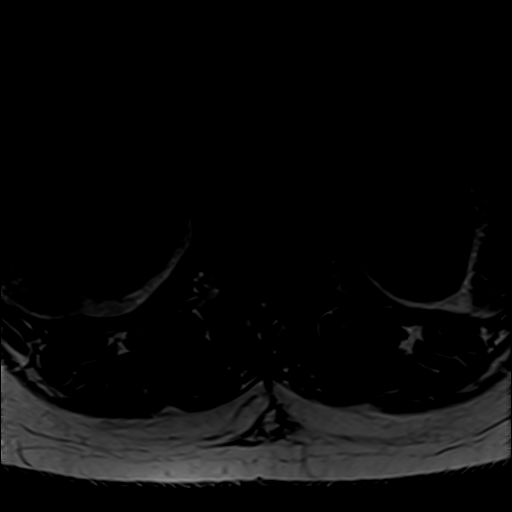
[im 6/39]
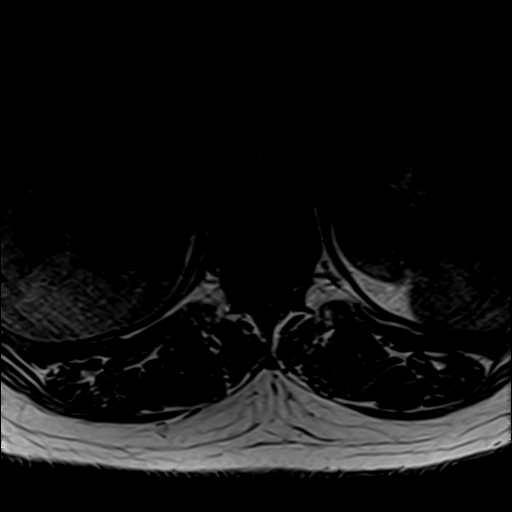
[im 11/39]
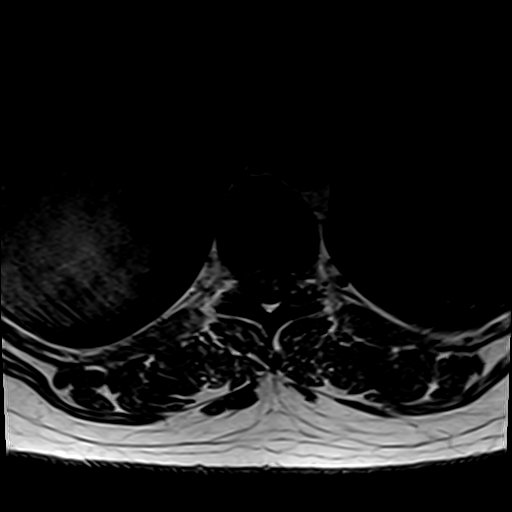
[im 17/39]
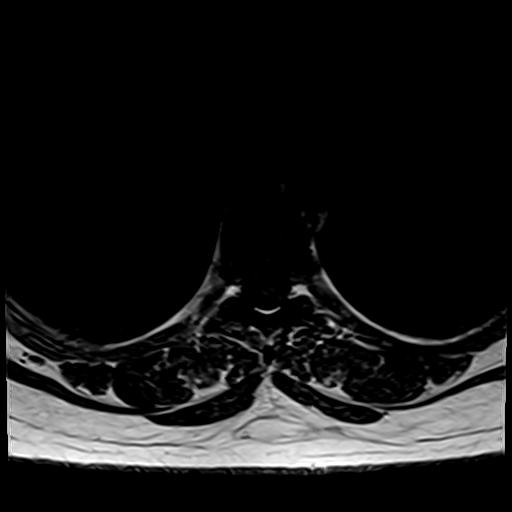
[im 22/39]
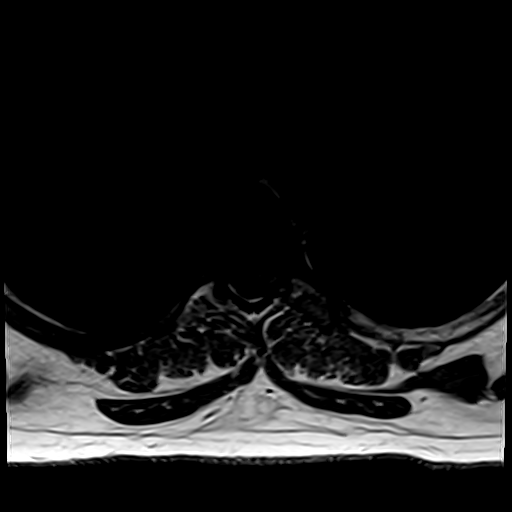
[im 28/39]
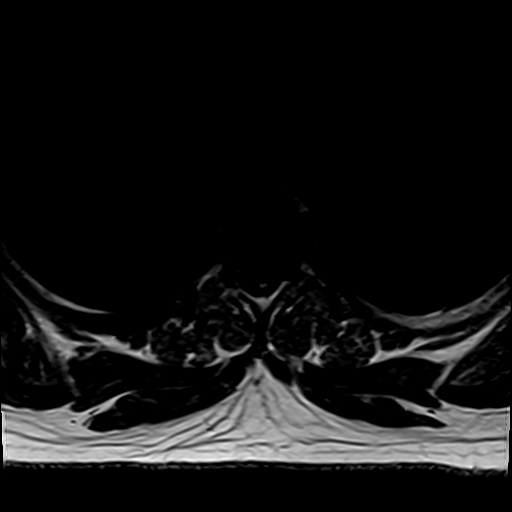
[im 33/39]
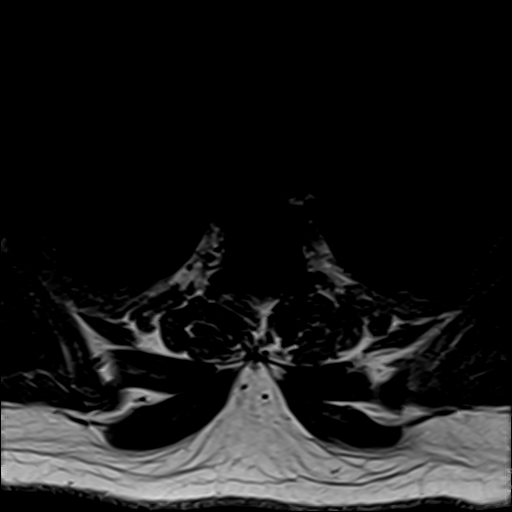
[im 39/39]
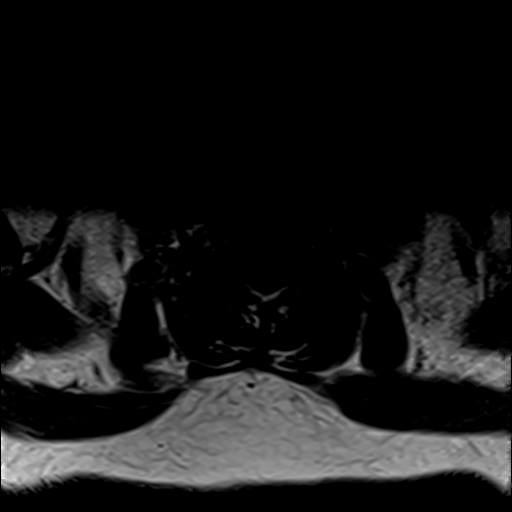

[Series 28: T1 fat-sat post-contrast · sagittal · 3.0mm · 1.33mm/px · 4 of 18 slices shown]
[im 1/18]
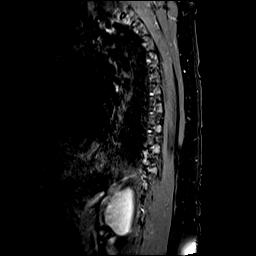
[im 6/18]
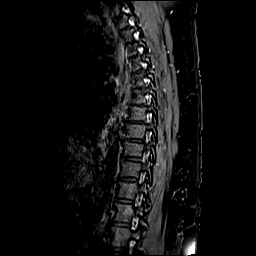
[im 12/18]
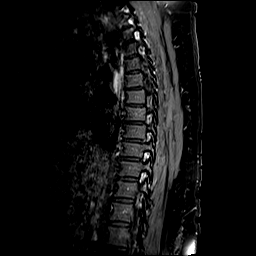
[im 18/18]
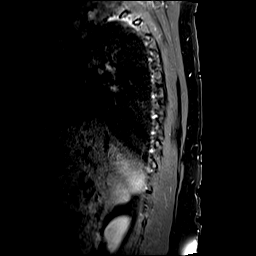

[32 of 48 positions shown; findings below may reference images not displayed]

FINDINGS: MRI CERVICAL SPINE FINDINGS

Alignment: Physiologic.

Vertebrae: No fracture, evidence of discitis, or bone lesion.

Cord: Normal signal and morphology.

Posterior Fossa, vertebral arteries, paraspinal tissues: Negative.

Disc levels:

No spinal canal stenosis or neural impingement.

No abnormal contrast enhancement.

MRI THORACIC SPINE FINDINGS

Alignment:  Physiologic.

Vertebrae: No fracture, evidence of discitis, or bone lesion.

Cord:  Normal signal and morphology.

Paraspinal and other soft tissues: Negative.

Disc levels:

No spinal canal stenosis or neural impingement.

No abnormal contrast enhancement.
IMPRESSION: Normal MRI of the cervical and thoracic spine.

## 2021-06-04 IMAGING — MR MR [PERSON_NAME] LOW WO/W CM*L*
9 series · 39 of 40 positions shown · IV contrast (gadavist)
Comparison: Radiograph [DATE]

CLINICAL DATA: Wound on left shin

EXAM:
MRI OF LOWER LEFT EXTREMITY WITHOUT AND WITH CONTRAST
TECHNIQUE: Multiplanar, multisequence MR imaging of the left lower extremity
(tibia and fibula) was performed both before and after
administration of intravenous contrast.
CONTRAST:  10mL GADAVIST GADOBUTROL 1 MMOL/ML IV SOLN

[Series 32: composed cor t1_comp · coronal · 5.0mm · 1.04mm/px · 2 of 32 slices shown]
[im 1/32]
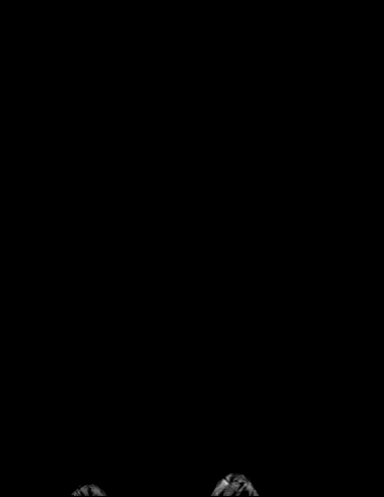
[im 32/32]
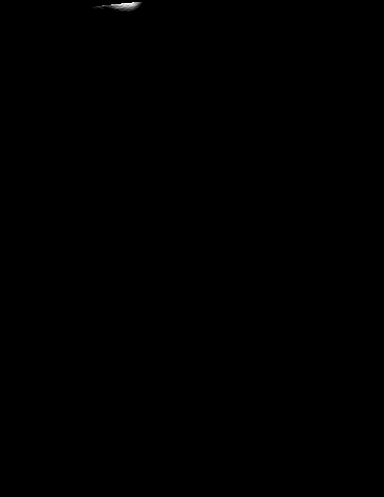

[Series 36: composed cor stir_comp · coronal · 5.0mm · 1.04mm/px · 2 of 36 slices shown]
[im 1/36]
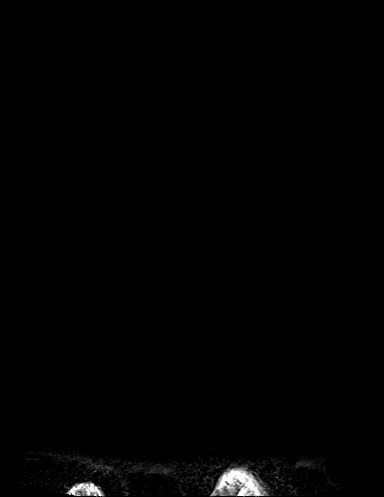
[im 36/36]
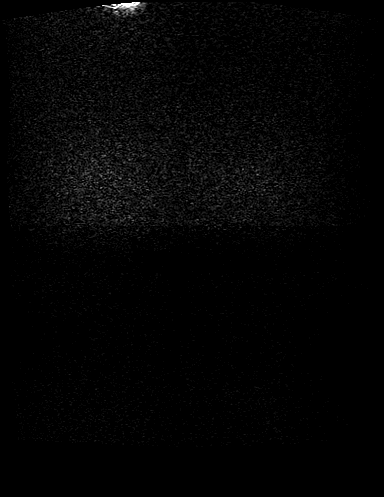

[Series 40: ax t1_comp_filt · axial · 4.0mm · 0.70mm/px · z∈[-197,+178]mm · 6 of 73 slices shown]
[im 1/73]
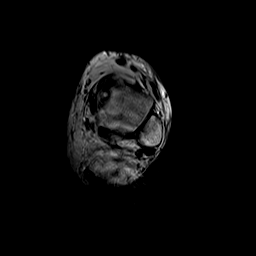
[im 15/73]
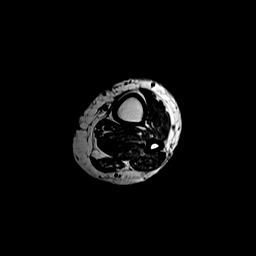
[im 29/73]
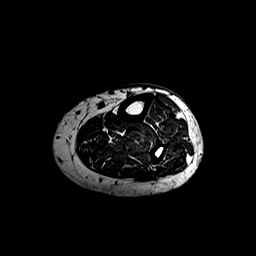
[im 44/73]
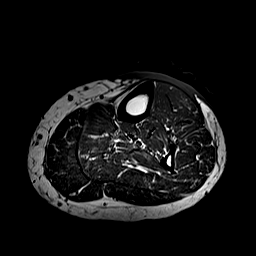
[im 58/73]
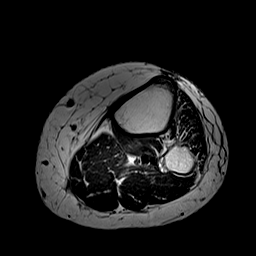
[im 73/73]
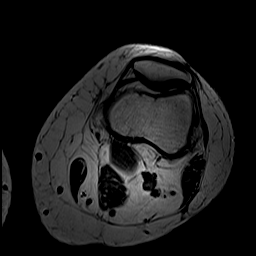

[Series 43: T2 · axial · 4.0mm · 0.66mm/px · z∈[-197,+178]mm · 7 of 73 slices shown]
[im 1/73]
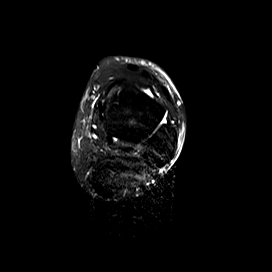
[im 13/73]
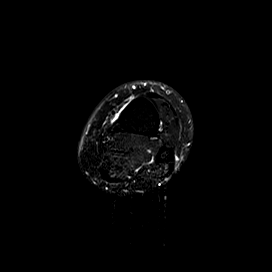
[im 25/73]
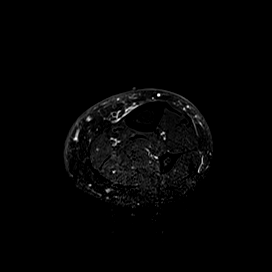
[im 37/73]
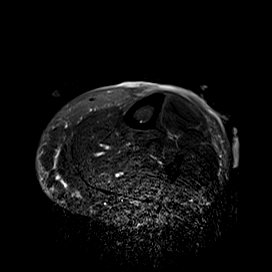
[im 49/73]
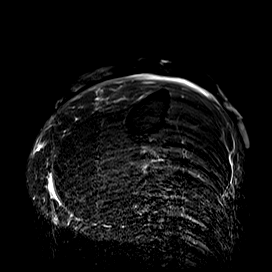
[im 61/73]
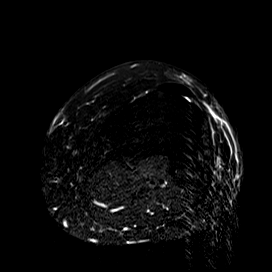
[im 73/73]
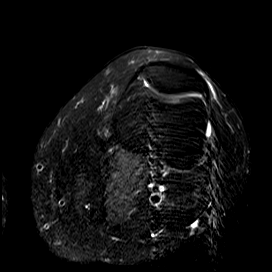

[Series 46: t2_tse_fs_sag fs_comp · sagittal · 5.6mm · 0.88mm/px · 2 of 28 slices shown]
[im 1/28]
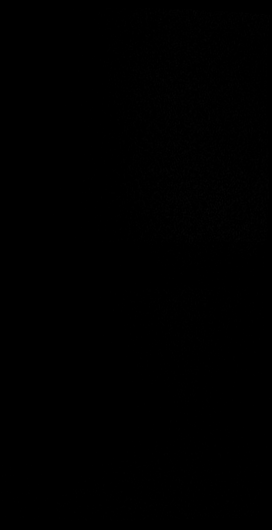
[im 14/28]
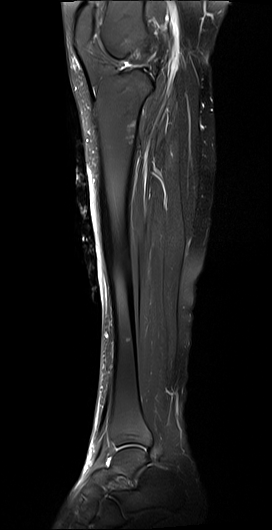

[Series 49: T1 fat-sat · axial · 4.0mm · 0.70mm/px · z∈[-198,+177]mm · 7 of 73 slices shown (1 of 2)]
[im 1/73]
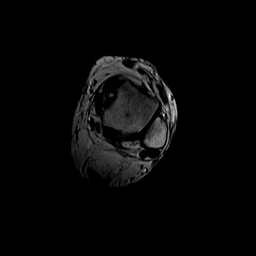
[im 13/73]
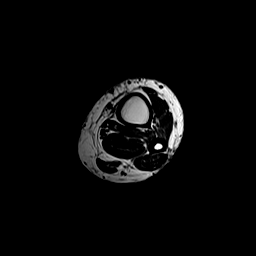
[im 25/73]
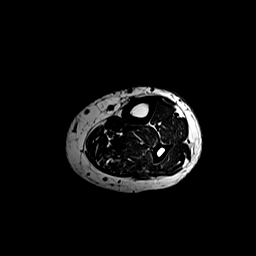
[im 37/73]
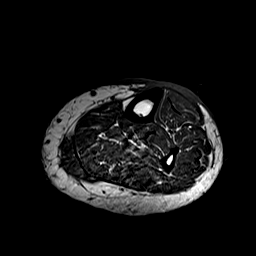
[im 49/73]
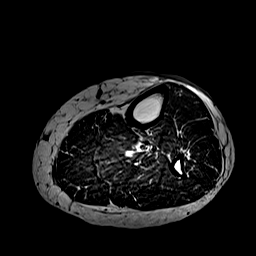
[im 61/73]
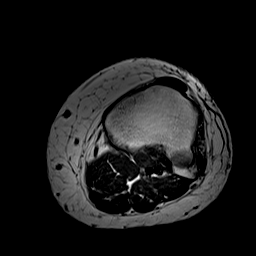
[im 73/73]
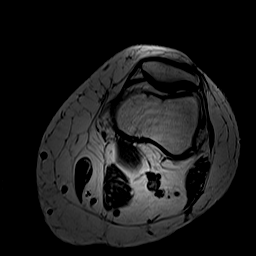

[Series 52: T1 fat-sat · axial · 4.0mm · 0.70mm/px · z∈[-198,+177]mm · 7 of 73 slices shown (2 of 2)]
[im 1/73]
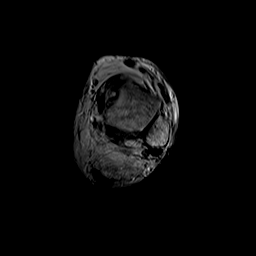
[im 13/73]
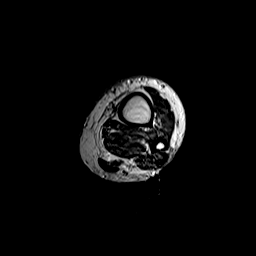
[im 25/73]
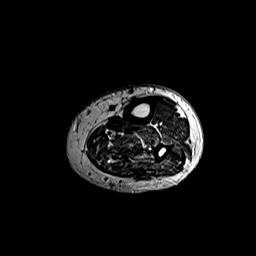
[im 37/73]
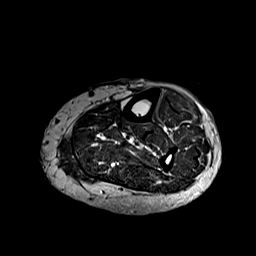
[im 49/73]
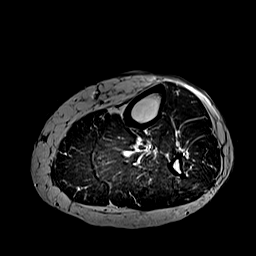
[im 61/73]
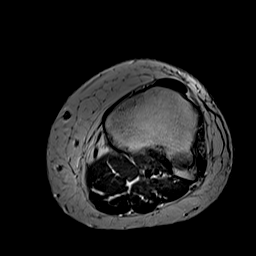
[im 73/73]
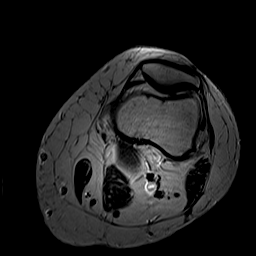

[Series 53: T1 · sagittal · 4.5mm · 0.44mm/px · 3 of 28 slices shown (1 of 2)]
[im 1/28]
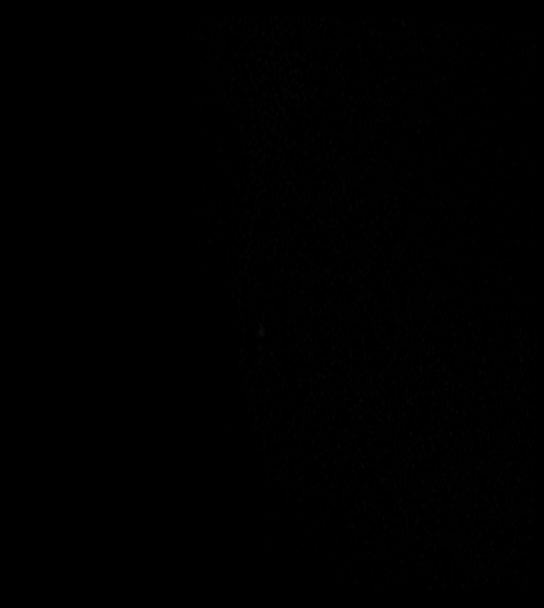
[im 14/28]
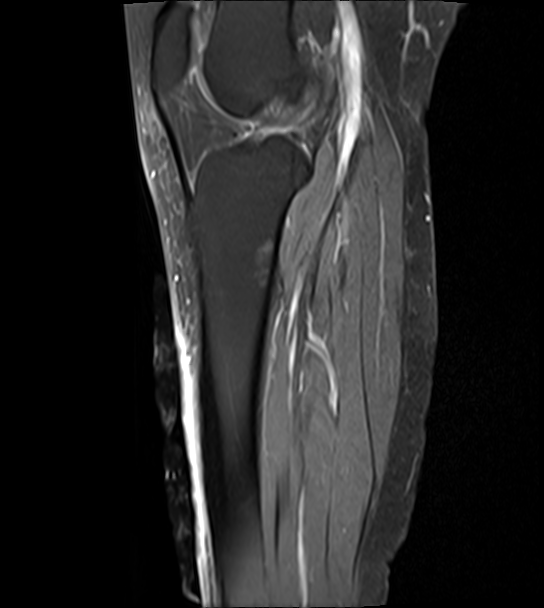
[im 28/28]
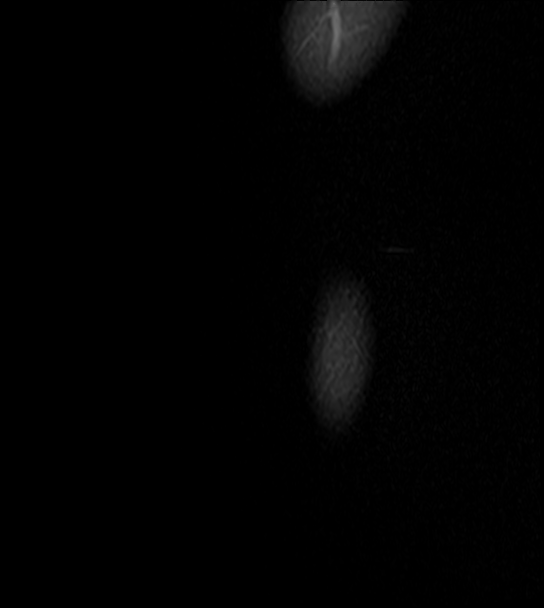

[Series 54: T1 · sagittal · 4.5mm · 0.44mm/px · 3 of 28 slices shown (2 of 2)]
[im 1/28]
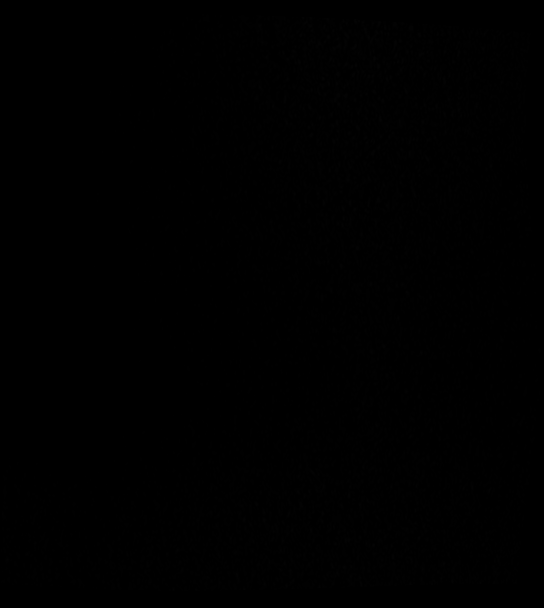
[im 14/28]
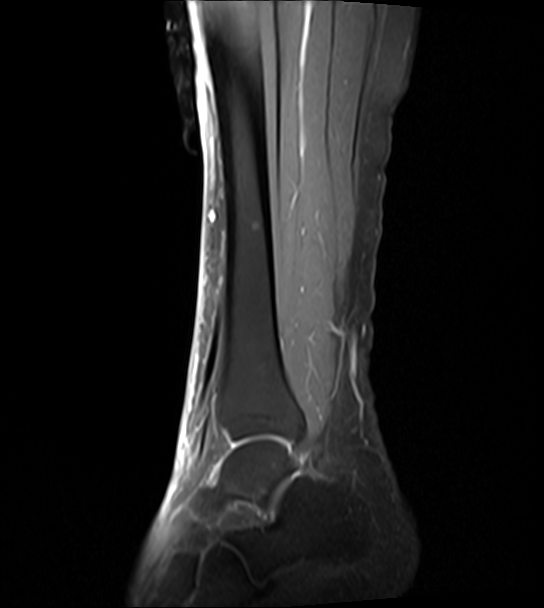
[im 28/28]
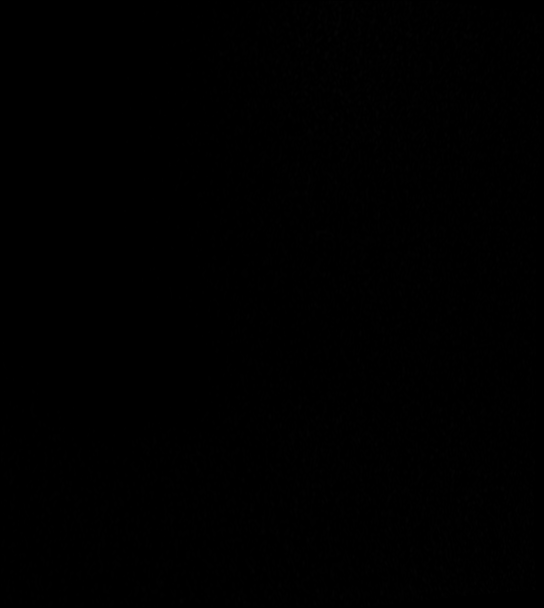

[39 of 40 positions shown; findings below may reference images not displayed]

FINDINGS: Failure of fat saturation on axial postcontrast imaging. Adequate
sagittal postcontrast imaging.

Bones/Joint/Cartilage

The cortex is intact. There is no significant marrow signal
alteration.

Ligaments

The interosseous membrane is unremarkable.

Muscles and Tendons

There is no significant muscle atrophy or muscle edema. No
intramuscular collection.

Soft tissues

There is a large soft tissue wound along the anterolateral leg with
adjacent swelling. There is no well-defined/drainable fluid
collection.
IMPRESSION: Large anterior soft tissue wound along the left lower leg. No
evidence underlying osteomyelitis or soft tissue abscess.

## 2021-06-04 IMAGING — CR DG TIBIA/FIBULA 2V*L*
2 series · 2 of 2 positions shown · non-contrast
Comparison: None.

CLINICAL DATA: Soft tissue ulcer, evaluate for osteomyelitis.

EXAM:
LEFT TIBIA AND FIBULA - 2 VIEW

[tibia ap]
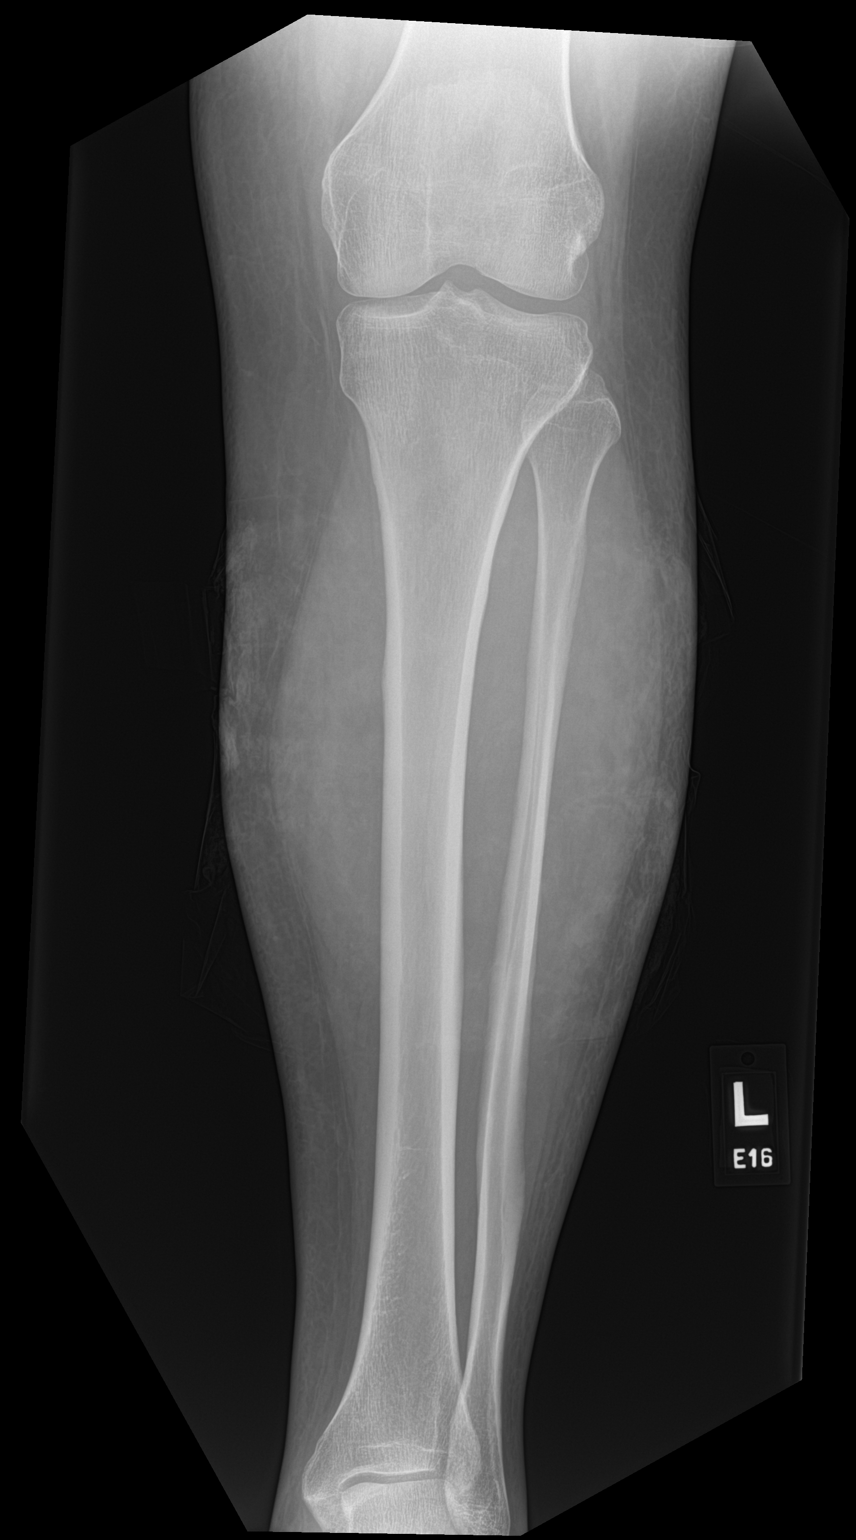

[tibia lat]
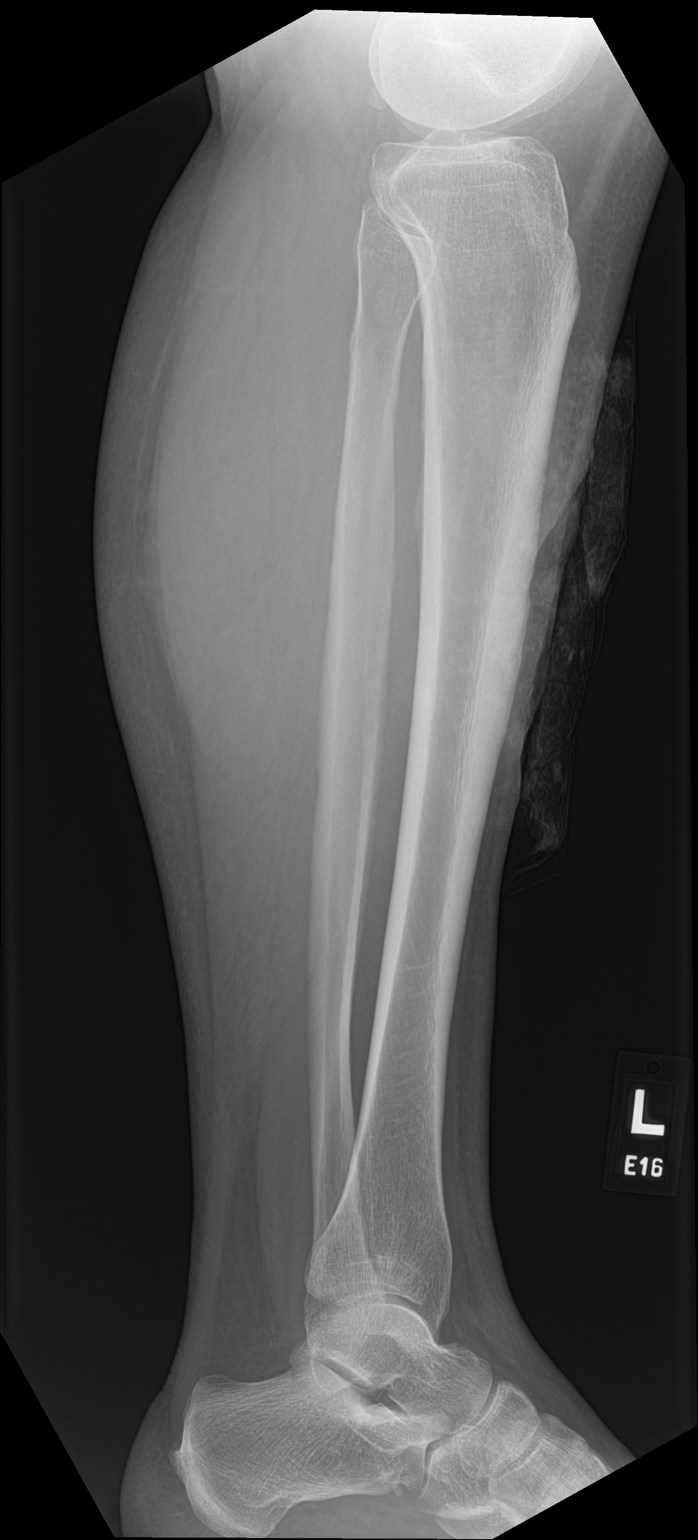

[2 of 2 positions shown; findings below may reference images not displayed]

FINDINGS: Slight indentation of the ventral cortex of the mid tibial shaft, on
the lateral view. Overlying soft tissue wound. Osseous structures
are otherwise unremarkable.
IMPRESSION: Slight indentation of the ventral cortex of the mid tibial shaft may
be developmental. Difficult to exclude osteomyelitis.

## 2021-06-04 MED ORDER — QUETIAPINE FUMARATE 25 MG PO TABS
100.0000 mg | ORAL_TABLET | Freq: Once | ORAL | Status: DC
  Filled 2021-06-04: qty 4

## 2021-06-04 MED ORDER — DOXYCYCLINE HYCLATE 50 MG PO CAPS: 100.0000 mg | ORAL_CAPSULE | Freq: Two times a day (BID) | ORAL | 0 refills | Status: AC

## 2021-06-04 MED ORDER — CYCLOBENZAPRINE HCL 5 MG PO TABS: 5.0000 mg | ORAL_TABLET | Freq: Three times a day (TID) | ORAL | 0 refills | Status: AC | PRN

## 2021-06-04 MED ORDER — QUETIAPINE FUMARATE 100 MG PO TABS: 100.0000 mg | ORAL_TABLET | Freq: Every day | ORAL | 0 refills | Status: DC

## 2021-06-04 MED ORDER — GADOBUTROL 1 MMOL/ML IV SOLN
10.0000 mL | Freq: Once | INTRAVENOUS | Status: AC | PRN
  Administered 2021-06-04: 10 mL via INTRAVENOUS

## 2021-06-04 MED ORDER — LORAZEPAM 2 MG/ML IJ SOLN
1.0000 mg | Freq: Once | INTRAMUSCULAR | Status: AC
  Administered 2021-06-04: 1 mg via INTRAVENOUS
  Filled 2021-06-04: qty 1

## 2021-06-04 MED ORDER — CYCLOBENZAPRINE HCL 10 MG PO TABS
5.0000 mg | ORAL_TABLET | Freq: Once | ORAL | Status: DC
  Filled 2021-06-04: qty 1

## 2021-06-04 NOTE — ED Notes (Signed)
RN unsuccessful Iv attempt. IV team consult placed.  ?

## 2021-06-04 NOTE — ED Notes (Addendum)
Pt appears tired in bed. A/ox4, c/o thoracic spine tenderness and pain x 3 days. Pt denies any kind of trauma. Pt very tender to palp throughout T spine. Denies incontinence or numbness/tingling. VSS ?

## 2021-06-04 NOTE — ED Notes (Signed)
Attempted to administer DC medications and discharge patient from ED, however patient not in room. Patient's belongings not in room. No sign of IV in either trash can. Erie Noe, charge RN notified. ?

## 2021-06-04 NOTE — ED Notes (Signed)
Patient back to ER room 14 from MRI.  ?

## 2021-06-04 NOTE — ED Notes (Signed)
Writer called phone number listed for pt in chart with no ability to leave message or make contact with pt. Emergency contact listed in chart contacted next with answer and reports pt is with him. Writer spoke with pt and provided policy and procedure on pts who leave with IV in place as well as informs of staff needing to visually confirm IV has been removed. Pt sts, "Well I took it out in the bathroom cause the doctor was discharging me anyway and my ride was leaving. I can maybe come up there tomorrow." Pt informed that local law enforcement will be coming to address to ensure removal and pt sts, "I aint going to be there tonight. Im in [employer] expresses ability to have [employer] come to current address and that they would only be coming to see that IV removal is present. Pt sts, "No, I will find a ride up there now."  ?

## 2021-06-04 NOTE — ED Notes (Signed)
See triage note, pt reports here for upper back pain/neck pain for 3 days. Denies recent injuries. Significant wound noted to left lower leg, drainage noted. Pt denies fevers. +chills.  ?Last cocaine use 2 days ago. Endorses IV drug use. Blisters noted to hands. Minor wounds noted to right lower leg.  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  ?Pt eating breakfast, call bell in reach, stretcher locked in lowest position. Denies needs.  ?

## 2021-06-04 NOTE — ED Notes (Signed)
MRI called, state they will get patient in the next thirty minutes. MRI state premedication may be given now for MRI.  ?

## 2021-06-04 NOTE — ED Notes (Signed)
Patient is in MRI at this time

## 2021-06-04 NOTE — ED Triage Notes (Signed)
Pt via POV from home. Pt c/o upper back pain that started 3 days ago and now is going up into her neck. 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 Pt has not been seen at wound care for this. Pt states she is susceptible to blood infections.  Pt is A&OX4 and NAD ?

## 2021-06-04 NOTE — ED Provider Notes (Signed)
? ?[facility] ?Provider Note ? ? ? Event Date/Time  ? First MD Initiated Contact with Patient 06/04/21 [phone]   ?  (approximate) ? ? ?History  ? ?Back Pain, Neck Pain, and Wound Check ? ? ?HPI ? ?Karen Clarke is a 36 y.o. female  with pmh systemic lupus erythematosus, IgA deficiency, cocaine and opiate use disorder who presents with both back pain and a wound on her left shin.  Patient notes that she has had this wound in the left shin for about 2 years.  T over the last 2 weeks it has been becoming more sore she says the pain is deeper feels like it is in the bone.  The wound is also becoming larger.  Patient also notes over the last several days has had pain in her upper back in the midline.  Denies numbness tingling weakness urinary or bowel incontinence.  No low back pain denies chest pain shortness of breath.  Denies fevers chills or sweats.  She has continued to use IV heroin but is cutting back.  She had previously had wound care through [facility] for the wound on the left shin but has not had this currently.  She tried to follow-up with [facility] after recent admission for breast abscesses but does not have insurance so was unable to be seen.  She is currently getting this worked out. ? ?  ? ?Past Medical History:  ?Diagnosis Date  ? IgA deficiency ([place])   ? Lupus ([place])   ? ? ?Patient Active Problem List  ? Diagnosis Date Noted  ? Cellulitis of left breast 04/06/2021  ? Cellulitis of right breast 04/03/2021  ? Sepsis due to cellulitis ([place]) 03/01/2021  ? Bacteremia 02/08/2021  ? Cellulitis of right lower extremity 02/07/2021  ? 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 Polysubstance abuse ([place]) 06/21/2018  ? Pseudotumor cerebri 06/21/2018  ? Tobacco abuse 06/21/2018  ? IVDU (intravenous drug user) 06/16/2018  ? Positive hepatitis C antibody test 03/27/2016  ? ? ? ?Physical Exam  ?Triage Vital Signs: ?ED Triage Vitals  ?Enc Vitals Group  ?   BP 06/04/21 0850 (!) 130/59  ?   Pulse Rate  06/04/21 0850 83  ?   Resp 06/04/21 0850 20  ?   Temp 06/04/21 0850 97.9 ?F (36.6 ?C)  ?   Temp Source 06/04/21 0850 Oral  ?   SpO2 06/04/21 0850 98 %  ?   Weight 06/04/21 0851 220 lb (99.8 kg)  ?   Height 06/04/21 0851 5' 3"  (1.6 m)  ?   Head Circumference --   ?   Peak Flow --   ?   Pain Score 06/04/21 0851 8  ?   Pain Loc --   ?   Pain Edu? --   ?   Excl. in [place]? --   ? ? ?Most recent vital signs: ?Vitals:  ? 06/04/21 1417 06/04/21 1515  ?BP: 133/65 131/69  ?Pulse: 90 90  ?Resp: 14 14  ?Temp:    ?SpO2: 99% 91%  ? ? ? ?General: Awake, no distress.  ?CV:  Good peripheral perfusion.  ?Resp:  Normal effort.  ?Abd:  No distention.  ?Neuro:             Awake, Alert, Oriented x 3  ?Other:   ? ?Large circular ulcer on the right pretibial area, granulation tissue with some surrounding erythema; no purulence ? ?Tenderness to palpation in the midline low cervical and upper thoracic region no  overlying skin change ? ? ?ED Results / Procedures / Treatments  ?Labs ?(all labs ordered are listed, but only abnormal results are displayed) ?Labs Reviewed  ?COMPREHENSIVE METABOLIC PANEL - Abnormal; Notable for the following components:  ?    Result Value  ? Glucose, Bld 105 (*)   ? Calcium 8.3 (*)   ? Albumin 3.4 (*)   ? Alkaline Phosphatase 162 (*)   ? All other components within normal limits  ?CBC WITH DIFFERENTIAL/PLATELET - Abnormal; Notable for the following components:  ? RBC 3.69 (*)   ? Hemoglobin 8.2 (*)   ? HCT 27.9 (*)   ? MCV 75.6 (*)   ? MCH 22.2 (*)   ? MCHC 29.4 (*)   ? RDW 17.5 (*)   ? All other components within normal limits  ?SEDIMENTATION RATE - Abnormal; Notable for the following components:  ? Sed Rate 70 (*)   ? All other components within normal limits  ?HCG, QUANTITATIVE, PREGNANCY  ?C-REACTIVE PROTEIN  ?POC URINE PREG, ED  ? ? ? ?EKG ? ? ? ? ?RADIOLOGY ?Reviewed x-ray of the left tib-fib which does show some cortical disruption ? ? ?PROCEDURES: ? ?Critical Care performed: No ? ?Procedures ? ?The patient is  on the cardiac monitor to evaluate for evidence of arrhythmia and/or significant heart rate changes. ? ? ?MEDICATIONS ORDERED IN ED: ?Medications  ?LORazepam (ATIVAN) injection 1 mg (has no administration in time range)  ? ? ? ?IMPRESSION / MDM / ASSESSMENT AND PLAN / ED COURSE  ?I reviewed the triage vital signs and the nursing notes. ?             ?               ? ?Differential diagnosis includes, but is not limited to, thoracic or cervical epidural abscess, osteomyelitis discitis, osteomyelitis of the tibia ? ?The patient is a 36 year old female with a history of IV drug use who presents with both neck and back pain as well as increasing size of the wound and increasing pain of the left tib-fib.  In terms of her back pain is tingling on for several days is not associated with other neurologic symptoms.  She has not had fevers or systemic symptoms.  Does have focal midline upper thoracic and lower C-spine tenderness.  Neurologically she is intact.  I am concerned about potential osteomyelitis discitis or epidural abscess given her history of IV drug use so we will obtain an MRI of the C-spine and thoracic spine.  T and x-ray of the left tib-fib to evaluate for underlying osteomyelitis there is some suggestion of cortical involvement.  We will obtain MRI of the tib-fib as well.  Labs are overall reassuring no leukocytosis however ESR is quite elevated at 70.  CMP notable for elevated alk phos. ? ?Signed out to oncoming provider pending her imaging.  Ativan is ordered for MRI. ? ?  ? ? ?FINAL CLINICAL IMPRESSION(S) / ED DIAGNOSES  ? ?Final diagnoses:  ?Wound of left lower extremity, initial encounter  ? ? ? ?Rx / DC Orders  ? ?ED Discharge Orders   ? ? None  ? ?  ? ? ? ?Note:  This document was prepared using Dragon voice recognition software and may include unintentional dictation errors. ?  ?Rada Hay, MD ?06/04/21 1555 ? ?

## 2021-06-05 NOTE — ED Notes (Addendum)
Pt to ED at this time for staff to confirm IV removal. Writer and Rachael, RN witnessed bilateral arms with no IV present. Pt also provided with discharge papers that she left before receiving with education on follow up care and prescriptions.  ?

## 2021-06-20 ENCOUNTER — Ambulatory Visit: Payer: Medicaid Other | Admitting: Internal Medicine

## 2021-07-11 ENCOUNTER — Emergency Department
Admission: EM | Admit: 2021-07-11 | Discharge: 2021-07-12 | Discharge status: Against Medical Advice | Payer: Medicaid Other | Attending: Emergency Medicine | Admitting: Emergency Medicine

## 2021-07-11 ENCOUNTER — Encounter: Payer: Self-pay | Admitting: Emergency Medicine

## 2021-07-11 DIAGNOSIS — R451 Restlessness and agitation: Secondary | ICD-10-CM | POA: Insufficient documentation

## 2021-07-11 DIAGNOSIS — R000 Tachycardia, unspecified: Secondary | ICD-10-CM | POA: Insufficient documentation

## 2021-07-11 DIAGNOSIS — L03116 Cellulitis of left lower limb: Secondary | ICD-10-CM | POA: Insufficient documentation

## 2021-07-11 DIAGNOSIS — F1990 Other psychoactive substance use, unspecified, uncomplicated: Secondary | ICD-10-CM | POA: Insufficient documentation

## 2021-07-11 DIAGNOSIS — F419 Anxiety disorder, unspecified: Secondary | ICD-10-CM | POA: Insufficient documentation

## 2021-07-11 HISTORY — DX: Endocarditis, valve unspecified: I38

## 2021-07-11 MED ORDER — VANCOMYCIN HCL IN DEXTROSE 1-5 GM/200ML-% IV SOLN: 1000.0000 mg | Freq: Once | INTRAVENOUS | Status: DC

## 2021-07-11 MED ORDER — KETOROLAC TROMETHAMINE 30 MG/ML IJ SOLN: 30.0000 mg | Freq: Once | INTRAMUSCULAR | Status: DC

## 2021-07-11 MED ORDER — LACTATED RINGERS IV BOLUS (SEPSIS): 1000.0000 mL | Freq: Once | INTRAVENOUS | Status: DC

## 2021-07-11 MED ORDER — LORAZEPAM 2 MG/ML IJ SOLN: 1.0000 mg | Freq: Once | INTRAMUSCULAR | Status: DC

## 2021-07-11 MED ORDER — SODIUM CHLORIDE 0.9 % IV SOLN: 2.0000 g | Freq: Once | INTRAVENOUS | Status: DC

## 2021-07-11 MED ORDER — LACTATED RINGERS IV BOLUS (SEPSIS): 600.0000 mL | Freq: Once | INTRAVENOUS | Status: DC

## 2021-07-11 NOTE — ED Triage Notes (Addendum)
Pt presents via POV with complaints of a chronic left leg (shin) wound. The wound is dressed in maxi pads and tape - foul smelling wound with drainage. Pt is ambulatory to triage. Pt states that she has been prescribed numerous antibiotics without improvement in the wound. She notes having a wound care appointment scheduled for next week. Of note, pt is IV drug user and last used cocaine this afternoon. ?

## 2021-07-12 MED ORDER — VANCOMYCIN HCL 2000 MG/400ML IV SOLN
2000.0000 mg | Freq: Once | INTRAVENOUS | Status: DC
  Filled 2021-07-12: qty 400

## 2021-07-12 MED ORDER — VANCOMYCIN HCL 500 MG/100ML IV SOLN
500.0000 mg | Freq: Once | INTRAVENOUS | Status: DC
  Filled 2021-07-12: qty 100

## 2021-07-12 MED ORDER — SODIUM CHLORIDE 0.9 % IV SOLN: 2.0000 g | Freq: Once | INTRAVENOUS | Status: DC

## 2021-07-12 NOTE — Sepsis Progress Note (Signed)
[facility] following Code Sepsis. 

## 2021-07-12 NOTE — ED Notes (Signed)
Charge nurse states pt has left facility.  ?

## 2021-07-12 NOTE — ED Provider Notes (Signed)
? ?[facility] ?Provider Note ? ? ? Event Date/Time  ? First MD Initiated Contact with Patient 07/11/21 2335   ?  (approximate) ? ? ?History  ? ?Leg Pain ? ? ?HPI ? ?Karen Clarke is a 36 y.o. 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  States she has been on numerous antibiotics but states over the past 4 to 5 days the wound has gotten larger, more painful and has had foul-smelling purulent drainage.  She is not aware of any fever.  Denies being a diabetic.  No vomiting.  Last used IV cocaine just prior to arrival.  She denies any injury to this leg.  She states that the wound started on her leg after a lupus flare and not from injection of IV drugs. ? ? ?History provided by patient. ? ? ? ?Past Medical History:  ?Diagnosis Date  ? Endocarditis   ? IgA deficiency (HCC)   ? Lupus (HCC)   ? ? ?Past Surgical History:  ?Procedure Laterality Date  ? TEE WITHOUT CARDIOVERSION N/A 02/09/2021  ? Procedure: TRANSESOPHAGEAL ECHOCARDIOGRAM (TEE);  Surgeon: Antonieta IbaGollan, Timothy J, MD;  Location: [facility] ORS;  Service: Cardiovascular;  Laterality: N/A;  ? TONSILLECTOMY    ? ? ?MEDICATIONS:  ?Prior to Admission medications   ?Medication Sig Start Date End Date Taking? Authorizing Provider  ?acetaminophen (TYLENOL) 325 MG tablet Take 2 tablets (650 mg total) by mouth every 6 (six) hours as needed for mild pain (or Fever >/= 101). 04/06/21   Lurene ShadowAyiku, Bernard, MD  ?ibuprofen (ADVIL) 400 MG tablet Take 2 tablets (800 mg total) by mouth every 8 (eight) hours as needed for moderate pain. 04/06/21   Lurene ShadowAyiku, Bernard, MD  ?methadone (DOLOPHINE) 10 MG/ML solution Take 100 mg by mouth daily. ?Patient not taking: Reported on 06/04/2021    [provider]  ?QUEtiapine (SEROQUEL) 100 MG tablet Take 1 tablet (100 mg total) by mouth at bedtime.  04/06/21   Lurene ShadowAyiku, Bernard, MD  ?QUEtiapine (SEROQUEL) 100 MG tablet Take 1 tablet (100 mg total) by mouth at bedtime for 7 days. 06/04/21 06/11/21  Merwyn KatosBradler, Evan K, MD  ? ? ?Physical Exam  ? ?Triage Vital Signs: ?ED Triage Vitals [07/11/21 2319]  ?Enc Vitals Group  ?   BP 137/78  ?   Pulse Rate 77  ?   Resp 18  ?   Temp 99.4 ?F (37.4 ?C)  ?   Temp Source Oral  ?   SpO2 98 %  ?   Weight 220 lb (99.8 kg)  ?   Height 5\' 3"  (1.6 m)  ?   Head Circumference   ?   Peak Flow   ?   Pain Score 6  ?   Pain Loc   ?   Pain Edu?   ?   Excl. in [place]?   ? ? ?Most recent vital signs: ?Vitals:  ? 07/11/21 2319  ?BP: 137/78  ?Pulse: 77  ?Resp: 18  ?Temp: 99.4 ?F (37.4 ?C)  ?SpO2: 98%  ? ? ?CONSTITUTIONAL: Alert and oriented and responds appropriately to questions.  Obese, chronically ill-appearing, tearful and difficult to redirect ?HEAD: Normocephalic, atraumatic ?EYES: Conjunctivae clear, pupils appear equal, sclera nonicteric ?ENT: normal nose; moist mucous membranes ?NECK: Supple, normal ROM ?CARD: Regular and tachycardic; S1 and S2 appreciated; no murmurs,  no clicks, no rubs, no gallops ?RESP: Normal chest excursion without splinting or tachypnea; breath sounds clear and equal bilaterally; no wheezes, no rhonchi, no rales, no hypoxia or respiratory distress, speaking full sentences ?ABD/GI: Normal bowel sounds; non-distended; soft, non-tender, no rebound, no guarding, no peritoneal signs ?BACK: The back appears normal ?EXT: Multiple track marks to all 4 extremities.  2+ DP pulses bilaterally.  No calf tenderness or calf swelling.  Compartments soft.  She has a large wound to the left anterior shin that is currently covered with maxipads.  She is refusing to allow me to remove the maxipads. ?SKIN: Normal color for age and race; warm; no rash on exposed skin ?NEURO: Moves all extremities equally, normal speech ?PSYCH: Peers very anxious, tearful ? ? ?ED Results / Procedures / Treatments  ? ?LABS: ?(all labs ordered are listed, but only  abnormal results are displayed) ?Labs Reviewed  ?CULTURE, BLOOD (ROUTINE X 2)  ?CULTURE, BLOOD (ROUTINE X 2)  ?AEROBIC/ANAEROBIC CULTURE W GRAM STAIN (SURGICAL/DEEP WOUND)  ?URINE CULTURE  ?CBC WITH DIFFERENTIAL/PLATELET  ?COMPREHENSIVE METABOLIC PANEL  ?LACTIC ACID, PLASMA  ?LACTIC ACID, PLASMA  ?PROTIME-INR  ?APTT  ?PROCALCITONIN  ?URINALYSIS, ROUTINE W REFLEX MICROSCOPIC  ?URINE DRUG SCREEN, QUALITATIVE ([facility] ONLY)  ?ETHANOL  ? ? ? ?EKG: ? EKG Interpretation ? ?Date/Time:    ?Ventricular Rate:    ?PR Interval:    ?QRS Duration:   ?QT Interval:    ?QTC Calculation:   ?R Axis:     ?Text Interpretation:   ?  ? ?  ? ? ? ?RADIOLOGY: ?My personal review and interpretation of imaging:   ? ?I have personally reviewed all radiology reports.   ?No results found. ? ? ?PROCEDURES: ? ?Critical Care performed: Yes, see critical care procedure note(s) ? ? ?CRITICAL CARE ?Performed by: Baxter Hire Cherysh Epperly ? ? ?Total critical care time: 35 minutes ? ?Critical care time was exclusive of separately billable procedures and treating other patients. ? ?Critical care was necessary to treat or prevent imminent or life-threatening deterioration. ? ?Critical care was time spent personally by me on the following activities: development of treatment plan with patient and/or surrogate as well as nursing, discussions with consultants, evaluation of patient's response to treatment, examination of patient, obtaining history from patient or surrogate, ordering and performing treatments and interventions, ordering and review of laboratory studies, ordering and review of radiographic studies, pulse oximetry and re-evaluation of patient's condition. ? ? ?Procedures ? ? ? ?IMPRESSION / MDM / ASSESSMENT AND PLAN / ED COURSE  ?I reviewed the triage vital signs and the nursing notes. ? ? ? ?Patient here for left lower extremity infection with history of IV drug abuse. ? ?The patient is on the cardiac monitor to evaluate for evidence of arrhythmia and/or  significant heart rate changes. ? ? ?DIFFERENTIAL DIAGNOSIS (includes but not limited to):   Cellulitis, abscess, necrotizing infection, osteomyelitis, bacteremia ? ? ?PLAN: We will obtain septic work-up with CBC, CMP, urinalysis, urine drug screen, ethanol level, EKG, x-ray of the left lower extremity, blood cultures.  Will give broad-spectrum antibiotics and IV fluids.  I have ordered Toradol and Ativan for symptomatic relief.  We are attempting to remove the maxipads that are adhered to the wound on her left leg using saline.  During this process patient becomes increasingly agitated and states repeatedly "I cannot do this".  Have offered her medication to help with pain and anxiety but discussed with her that we need to be able to see this wound and  send a wound culture.  Explained to patient that we are trying to help her and she states "I know.  I know I have done this to myself".  She states that she wants to leave without further work-up.  We discussed that her symptoms could worsen leading to worsening pain, infection, severe and permanent disability, amputation of her leg and even death.  She verbalized understanding and does not appear intoxicated at this time.  Appears to have capacity to make decisions for herself.  Patient walked out of the emergency department before any work-up could be completed or I can even visualize the wound to her left leg.  She did not sign paperwork or receive discharge papers. ? ? ?MEDICATIONS GIVEN IN ED: ?Medications  ?lactated ringers bolus 1,000 mL (has no administration in time range)  ?  And  ?lactated ringers bolus 600 mL (has no administration in time range)  ?ketorolac (TORADOL) 30 MG/ML injection 30 mg (has no administration in time range)  ?LORazepam (ATIVAN) injection 1 mg (has no administration in time range)  ?cefTRIAXone (ROCEPHIN) 2 g in sodium chloride 0.9 % 100 mL IVPB (has no administration in time range)  ?vancomycin (VANCOREADY) IVPB 2000 mg/400 mL (has  no administration in time range)  ?  Followed by  ?vancomycin (VANCOREADY) IVPB 500 mg/100 mL (has no administration in time range)  ? ? ? ?ED COURSE: Charge nurse reports patient is now back in the lobby statin

## 2021-07-12 NOTE — Progress Notes (Signed)
PHARMACY -  BRIEF ANTIBIOTIC NOTE  ? ?Pharmacy has received consult(s) for Vancomycin, Aztreonam from an ED provider.  The patient's profile has been reviewed for ht/wt/allergies/indication/available labs.   ? ?One time order(s) placed for Vancomycin 2500 mg IV X 1 and Ceftriaxone 2 gm IV X 1 (ceftriaxone substituted for aztreonam b/c of prior tolerance of cefepime)  ? ?Further antibiotics/pharmacy consults should be ordered by admitting physician if indicated.       ?                ?Thank you, ?Ruslan Mccabe D ?07/12/2021  12:26 AM ? ?

## 2021-07-17 ENCOUNTER — Ambulatory Visit: Payer: Medicaid Other | Admitting: Physician Assistant

## 2021-07-31 ENCOUNTER — Ambulatory Visit: Payer: Medicaid Other | Admitting: Physician Assistant

## 2021-07-31 DIAGNOSIS — R600 Localized edema: Secondary | ICD-10-CM | POA: Insufficient documentation

## 2022-03-17 ENCOUNTER — Emergency Department: Payer: Medicaid Other

## 2022-03-17 ENCOUNTER — Inpatient Hospital Stay
Admission: EM | Admit: 2022-03-17 | Discharge: 2022-03-20 | DRG: 175 | Discharge status: Against Medical Advice | Payer: Medicaid Other | Attending: Internal Medicine | Admitting: Internal Medicine

## 2022-03-17 ENCOUNTER — Other Ambulatory Visit: Payer: Self-pay

## 2022-03-17 ENCOUNTER — Encounter: Payer: Self-pay | Admitting: Internal Medicine

## 2022-03-17 DIAGNOSIS — D62 Acute posthemorrhagic anemia: Secondary | ICD-10-CM | POA: Diagnosis not present

## 2022-03-17 DIAGNOSIS — B1920 Unspecified viral hepatitis C without hepatic coma: Secondary | ICD-10-CM | POA: Diagnosis present

## 2022-03-17 DIAGNOSIS — F1990 Other psychoactive substance use, unspecified, uncomplicated: Secondary | ICD-10-CM | POA: Diagnosis present

## 2022-03-17 DIAGNOSIS — F1120 Opioid dependence, uncomplicated: Secondary | ICD-10-CM | POA: Diagnosis present

## 2022-03-17 DIAGNOSIS — L97809 Non-pressure chronic ulcer of other part of unspecified lower leg with unspecified severity: Secondary | ICD-10-CM | POA: Diagnosis present

## 2022-03-17 DIAGNOSIS — F17210 Nicotine dependence, cigarettes, uncomplicated: Secondary | ICD-10-CM | POA: Diagnosis present

## 2022-03-17 DIAGNOSIS — Z79899 Other long term (current) drug therapy: Secondary | ICD-10-CM | POA: Diagnosis not present

## 2022-03-17 DIAGNOSIS — D509 Iron deficiency anemia, unspecified: Secondary | ICD-10-CM | POA: Diagnosis present

## 2022-03-17 DIAGNOSIS — E669 Obesity, unspecified: Secondary | ICD-10-CM | POA: Diagnosis present

## 2022-03-17 DIAGNOSIS — I2699 Other pulmonary embolism without acute cor pulmonale: Secondary | ICD-10-CM | POA: Diagnosis present

## 2022-03-17 DIAGNOSIS — R079 Chest pain, unspecified: Secondary | ICD-10-CM | POA: Diagnosis not present

## 2022-03-17 DIAGNOSIS — B957 Other staphylococcus as the cause of diseases classified elsewhere: Secondary | ICD-10-CM | POA: Diagnosis not present

## 2022-03-17 DIAGNOSIS — L88 Pyoderma gangrenosum: Secondary | ICD-10-CM | POA: Diagnosis present

## 2022-03-17 DIAGNOSIS — Z888 Allergy status to other drugs, medicaments and biological substances status: Secondary | ICD-10-CM

## 2022-03-17 DIAGNOSIS — F1910 Other psychoactive substance abuse, uncomplicated: Secondary | ICD-10-CM | POA: Diagnosis not present

## 2022-03-17 DIAGNOSIS — Z8679 Personal history of other diseases of the circulatory system: Secondary | ICD-10-CM

## 2022-03-17 DIAGNOSIS — M329 Systemic lupus erythematosus, unspecified: Secondary | ICD-10-CM | POA: Diagnosis present

## 2022-03-17 DIAGNOSIS — Z1152 Encounter for screening for COVID-19: Secondary | ICD-10-CM

## 2022-03-17 DIAGNOSIS — Z6838 Body mass index (BMI) 38.0-38.9, adult: Secondary | ICD-10-CM | POA: Diagnosis not present

## 2022-03-17 DIAGNOSIS — R0781 Pleurodynia: Secondary | ICD-10-CM | POA: Diagnosis present

## 2022-03-17 DIAGNOSIS — Z8614 Personal history of Methicillin resistant Staphylococcus aureus infection: Secondary | ICD-10-CM

## 2022-03-17 DIAGNOSIS — R0602 Shortness of breath: Secondary | ICD-10-CM | POA: Diagnosis not present

## 2022-03-17 DIAGNOSIS — N939 Abnormal uterine and vaginal bleeding, unspecified: Secondary | ICD-10-CM

## 2022-03-17 DIAGNOSIS — D500 Iron deficiency anemia secondary to blood loss (chronic): Secondary | ICD-10-CM

## 2022-03-17 DIAGNOSIS — F1490 Cocaine use, unspecified, uncomplicated: Secondary | ICD-10-CM | POA: Diagnosis present

## 2022-03-17 DIAGNOSIS — R7881 Bacteremia: Secondary | ICD-10-CM | POA: Diagnosis not present

## 2022-03-17 DIAGNOSIS — D802 Selective deficiency of immunoglobulin A [IgA]: Secondary | ICD-10-CM | POA: Diagnosis present

## 2022-03-17 DIAGNOSIS — R768 Other specified abnormal immunological findings in serum: Secondary | ICD-10-CM | POA: Diagnosis present

## 2022-03-17 DIAGNOSIS — Z5329 Procedure and treatment not carried out because of patient's decision for other reasons: Secondary | ICD-10-CM | POA: Diagnosis not present

## 2022-03-17 DIAGNOSIS — R627 Adult failure to thrive: Secondary | ICD-10-CM | POA: Diagnosis present

## 2022-03-17 DIAGNOSIS — I330 Acute and subacute infective endocarditis: Secondary | ICD-10-CM | POA: Diagnosis present

## 2022-03-17 DIAGNOSIS — Z720 Tobacco use: Secondary | ICD-10-CM | POA: Diagnosis present

## 2022-03-17 DIAGNOSIS — D638 Anemia in other chronic diseases classified elsewhere: Secondary | ICD-10-CM | POA: Diagnosis not present

## 2022-03-17 DIAGNOSIS — E876 Hypokalemia: Secondary | ICD-10-CM | POA: Diagnosis not present

## 2022-03-17 DIAGNOSIS — Z86711 Personal history of pulmonary embolism: Secondary | ICD-10-CM | POA: Diagnosis present

## 2022-03-17 LAB — URINE DRUG SCREEN, QUALITATIVE (ARMC ONLY)
Amphetamines, Ur Screen: NOT DETECTED
Barbiturates, Ur Screen: NOT DETECTED
Benzodiazepine, Ur Scrn: NOT DETECTED
Cannabinoid 50 Ng, Ur ~~LOC~~: NOT DETECTED
Cocaine Metabolite,Ur ~~LOC~~: POSITIVE — AB
MDMA (Ecstasy)Ur Screen: NOT DETECTED
Methadone Scn, Ur: POSITIVE — AB
Opiate, Ur Screen: NOT DETECTED
Phencyclidine (PCP) Ur S: NOT DETECTED
Tricyclic, Ur Screen: POSITIVE — AB

## 2022-03-17 LAB — URINALYSIS, COMPLETE (UACMP) WITH MICROSCOPIC
Bacteria, UA: NONE SEEN
Bilirubin Urine: NEGATIVE
Glucose, UA: NEGATIVE mg/dL
Ketones, ur: NEGATIVE mg/dL
Leukocytes,Ua: NEGATIVE
Nitrite: NEGATIVE
Protein, ur: 30 mg/dL — AB
Specific Gravity, Urine: 1.039 — ABNORMAL HIGH (ref 1.005–1.030)
pH: 5 (ref 5.0–8.0)

## 2022-03-17 LAB — CBC WITH DIFFERENTIAL/PLATELET
Abs Immature Granulocytes: 0.06 10*3/uL (ref 0.00–0.07)
Basophils Absolute: 0 10*3/uL (ref 0.0–0.1)
Basophils Relative: 0 %
Eosinophils Absolute: 0.2 10*3/uL (ref 0.0–0.5)
Eosinophils Relative: 1 %
HCT: 28 % — ABNORMAL LOW (ref 36.0–46.0)
Hemoglobin: 8.6 g/dL — ABNORMAL LOW (ref 12.0–15.0)
Immature Granulocytes: 0 %
Lymphocytes Relative: 17 %
Lymphs Abs: 2.6 10*3/uL (ref 0.7–4.0)
MCH: 23.3 pg — ABNORMAL LOW (ref 26.0–34.0)
MCHC: 30.7 g/dL (ref 30.0–36.0)
MCV: 75.9 fL — ABNORMAL LOW (ref 80.0–100.0)
Monocytes Absolute: 1.2 10*3/uL — ABNORMAL HIGH (ref 0.1–1.0)
Monocytes Relative: 8 %
Neutro Abs: 11.3 10*3/uL — ABNORMAL HIGH (ref 1.7–7.7)
Neutrophils Relative %: 74 %
Platelets: 531 10*3/uL — ABNORMAL HIGH (ref 150–400)
RBC: 3.69 MIL/uL — ABNORMAL LOW (ref 3.87–5.11)
RDW: 17 % — ABNORMAL HIGH (ref 11.5–15.5)
WBC: 15.4 10*3/uL — ABNORMAL HIGH (ref 4.0–10.5)
nRBC: 0 % (ref 0.0–0.2)

## 2022-03-17 LAB — BASIC METABOLIC PANEL
Anion gap: 8 (ref 5–15)
BUN: 17 mg/dL (ref 6–20)
CO2: 19 mmol/L — ABNORMAL LOW (ref 22–32)
Calcium: 8.6 mg/dL — ABNORMAL LOW (ref 8.9–10.3)
Chloride: 109 mmol/L (ref 98–111)
Creatinine, Ser: 0.81 mg/dL (ref 0.44–1.00)
GFR, Estimated: >60 mL/min (ref >60)
Glucose, Bld: 101 mg/dL — ABNORMAL HIGH (ref 70–99)
Potassium: 4 mmol/L (ref 3.5–5.1)
Sodium: 136 mmol/L (ref 135–145)

## 2022-03-17 LAB — PROCALCITONIN: Procalcitonin: 0.35 ng/mL

## 2022-03-17 LAB — RESP PANEL BY RT-PCR (RSV, FLU A&B, COVID)  RVPGX2
Influenza A by PCR: NEGATIVE
Influenza B by PCR: NEGATIVE
Resp Syncytial Virus by PCR: NEGATIVE
SARS Coronavirus 2 by RT PCR: NEGATIVE

## 2022-03-17 LAB — TROPONIN I (HIGH SENSITIVITY)
Troponin I (High Sensitivity): 5 ng/L (ref <18)
Troponin I (High Sensitivity): 4 ng/L (ref <18)

## 2022-03-17 LAB — HEPARIN LEVEL (UNFRACTIONATED): Heparin Unfractionated: <0.1 IU/mL — ABNORMAL LOW (ref 0.30–0.70)

## 2022-03-17 LAB — LACTIC ACID, PLASMA
Lactic Acid, Venous: 1.4 mmol/L (ref 0.5–1.9)
Lactic Acid, Venous: 1.6 mmol/L (ref 0.5–1.9)

## 2022-03-17 MED ORDER — SODIUM CHLORIDE 0.9 % IV SOLN
2.0000 g | Freq: Three times a day (TID) | INTRAVENOUS | Status: DC
  Administered 2022-03-17 (×2): 2 g via INTRAVENOUS
  Filled 2022-03-17 (×2): qty 12.5

## 2022-03-17 MED ORDER — SODIUM CHLORIDE 0.9 % IV SOLN: 2.0000 g | Freq: Once | INTRAVENOUS | Status: DC

## 2022-03-17 MED ORDER — QUETIAPINE FUMARATE 25 MG PO TABS
100.0000 mg | ORAL_TABLET | Freq: Every day | ORAL | Status: DC
  Administered 2022-03-17 – 2022-03-19 (×3): 100 mg via ORAL
  Filled 2022-03-17 (×3): qty 4

## 2022-03-17 MED ORDER — SENNOSIDES-DOCUSATE SODIUM 8.6-50 MG PO TABS: 1.0000 | ORAL_TABLET | Freq: Every evening | ORAL | Status: DC | PRN

## 2022-03-17 MED ORDER — HEPARIN (PORCINE) 25000 UT/250ML-% IV SOLN
1550.0000 [IU]/h | INTRAVENOUS | Status: DC
  Administered 2022-03-17: 1300 [IU]/h via INTRAVENOUS
  Filled 2022-03-17 (×2): qty 250

## 2022-03-17 MED ORDER — ACETAMINOPHEN 650 MG RE SUPP: 650.0000 mg | Freq: Four times a day (QID) | RECTAL | Status: DC | PRN

## 2022-03-17 MED ORDER — IOHEXOL 350 MG/ML SOLN
75.0000 mL | Freq: Once | INTRAVENOUS | Status: AC | PRN
  Administered 2022-03-17: 75 mL via INTRAVENOUS

## 2022-03-17 MED ORDER — ONDANSETRON HCL 4 MG/2ML IJ SOLN
4.0000 mg | Freq: Four times a day (QID) | INTRAMUSCULAR | Status: DC | PRN
  Administered 2022-03-17 – 2022-03-19 (×2): 4 mg via INTRAVENOUS
  Filled 2022-03-17 (×2): qty 2

## 2022-03-17 MED ORDER — NICOTINE 21 MG/24HR TD PT24: 21.0000 mg | MEDICATED_PATCH | Freq: Every day | TRANSDERMAL | Status: DC | PRN

## 2022-03-17 MED ORDER — METHADONE HCL 10 MG/ML PO CONC
110.0000 mg | Freq: Every day | ORAL | Status: DC
  Administered 2022-03-18 – 2022-03-19 (×2): 110 mg via ORAL
  Filled 2022-03-17 (×3): qty 15

## 2022-03-17 MED ORDER — MORPHINE SULFATE (PF) 2 MG/ML IV SOLN
2.0000 mg | INTRAVENOUS | Status: DC | PRN
  Administered 2022-03-17 – 2022-03-20 (×8): 2 mg via INTRAVENOUS
  Filled 2022-03-17 (×8): qty 1

## 2022-03-17 MED ORDER — ACETAMINOPHEN 325 MG PO TABS
650.0000 mg | ORAL_TABLET | Freq: Four times a day (QID) | ORAL | Status: DC | PRN
  Administered 2022-03-17 – 2022-03-19 (×3): 650 mg via ORAL
  Filled 2022-03-17 (×3): qty 2

## 2022-03-17 MED ORDER — VANCOMYCIN HCL 750 MG/150ML IV SOLN
750.0000 mg | Freq: Two times a day (BID) | INTRAVENOUS | Status: DC
  Administered 2022-03-18: 750 mg via INTRAVENOUS
  Filled 2022-03-17: qty 150

## 2022-03-17 MED ORDER — VANCOMYCIN HCL 2000 MG/400ML IV SOLN
2000.0000 mg | Freq: Once | INTRAVENOUS | Status: AC
  Administered 2022-03-17: 2000 mg via INTRAVENOUS
  Filled 2022-03-17: qty 400

## 2022-03-17 MED ORDER — SODIUM CHLORIDE 0.9 % IV SOLN: Freq: Once | INTRAVENOUS | Status: AC

## 2022-03-17 MED ORDER — HEPARIN BOLUS VIA INFUSION
5300.0000 [IU] | Freq: Once | INTRAVENOUS | Status: AC
  Administered 2022-03-17: 5300 [IU] via INTRAVENOUS
  Filled 2022-03-17: qty 5300

## 2022-03-17 MED ORDER — SODIUM CHLORIDE 0.9 % IV BOLUS: 1000.0000 mL | Freq: Once | INTRAVENOUS | Status: DC

## 2022-03-17 MED ORDER — LEVOFLOXACIN IN D5W 750 MG/150ML IV SOLN: 750.0000 mg | Freq: Once | INTRAVENOUS | Status: DC

## 2022-03-17 MED ORDER — ONDANSETRON HCL 4 MG PO TABS: 4.0000 mg | ORAL_TABLET | Freq: Four times a day (QID) | ORAL | Status: DC | PRN

## 2022-03-17 MED ORDER — KETOROLAC TROMETHAMINE 15 MG/ML IJ SOLN
15.0000 mg | Freq: Four times a day (QID) | INTRAMUSCULAR | Status: DC | PRN
  Administered 2022-03-18 (×3): 15 mg via INTRAVENOUS
  Filled 2022-03-17 (×4): qty 1

## 2022-03-17 MED ORDER — ONDANSETRON HCL 4 MG/2ML IJ SOLN
4.0000 mg | Freq: Once | INTRAMUSCULAR | Status: AC
  Administered 2022-03-17: 4 mg via INTRAVENOUS
  Filled 2022-03-17: qty 2

## 2022-03-17 MED ORDER — SODIUM CHLORIDE 0.9 % IV BOLUS (SEPSIS)
1000.0000 mL | Freq: Once | INTRAVENOUS | Status: AC
  Administered 2022-03-17: 1000 mL via INTRAVENOUS

## 2022-03-17 NOTE — Assessment & Plan Note (Signed)
-  Patient states she last injected IV drugs 5 days ago.  Over the last 4 days she has not been feeling well and was not able to use IV drugs

## 2022-03-17 NOTE — Assessment & Plan Note (Signed)
-  Continue heparin per pharmacy to treat for PE

## 2022-03-17 NOTE — ED Notes (Signed)
Ultrasound IV was attempted by Elie Goody RN at this time. Pt was unable to tolerate the advancement of the IV. IV team has been contacted at this time.

## 2022-03-17 NOTE — Assessment & Plan Note (Signed)
-  As needed nicotine patch ordered ?

## 2022-03-17 NOTE — Hospital Course (Signed)
Ms. Karen Clarke is a 37 year old female with history of endocarditis, current IV drug user, history of PE, who presents emergency department for chief concerns of chest pain and shortness of breath.  Initial vitals in the emergency department showed temperature of 98.3, respiration rate of 18, heart rate of 96, blood pressure 129/76, SpO2 of 96% on room air.  Serum sodium is 136, potassium 4.0, chloride 109, bicarb 19, BUN of 17, serum creatinine of 0.81, eGFR greater than 60, nonfasting blood glucose 101, WBC was 15.4, hemoglobin 8.6, platelets of 531.  Lactic acid was 1-1.4.  High sensitive troponin was 5 and on repeat was 4.  COVID/influenza A/influenza B/RSV PCR were negative.    CTA for PE: Was read as focal filling defect at the branch point of the left interlobar artery into the basilar segmental branches compatible with pulmonary embolus.  Filling defect suspected within the medial basilar segment on the right.  Airspace opacity at the lung bases, left greater than right related to pulmonary emboli.  Infection is considered less likely.  ED treatment ceftriaxone 2 g IV one-time dose, levofloxacin 750 mg IV one-time dose, sodium chloride 1 L bolus.  [facility] admission request for PE.

## 2022-03-17 NOTE — Consult Note (Addendum)
PHARMACY -  BRIEF ANTIBIOTIC NOTE   Pharmacy has received consult(s) for levaquin from an ED provider.  The patient's profile has been reviewed for ht/wt/allergies/indication/available labs.    Patient has tolerated cefepime in the past and penicillin skin test negative. Given historical tolerance to cephalosporins in the past (aside from ancef) will adjust to Ceftriaxone 2g IV x1  Further antibiotics/pharmacy consults should be ordered by admitting physician if indicated.                       Thank you, Darrick Penna 03/17/2022  3:23 PM

## 2022-03-17 NOTE — ED Notes (Addendum)
The pt is agreeable to stay at this time s/p MD. Masy, speaking with the pt and further explaining the risk of leaving at this time. The pt has been ordered PRN morphine and Toradol for better pain management. Close monitoring continued.

## 2022-03-17 NOTE — ED Triage Notes (Addendum)
Pt states  a cough that started several days ago that started on her right collar bone, then went down to the left hip, up the left side and now to the left shoulder. Pt states history of endocarditis. Pt states pain when taking a deep breath. PT states she also started vomiting this morning.   Pt states wound to the left leg, and it turned black as well.

## 2022-03-17 NOTE — ED Provider Notes (Signed)
[facility] Provider Note    Event Date/Time   First MD Initiated Contact with Patient 03/17/22 0845     (approximate)   History   Left sided chest pain, sob   HPI  Karen Clarke is a 37 y.o. female  who presents to the emergency department today because of concern for left sided chest pain and shortness of breath. The patient states that she first started having discomfort in her chest roughly 4 days ago. Located in the right upper chest. It then moved to the left side of her chest and that is where it is currently present. 2 days ago she started having difficulty with breathing. Feels it is hard to take a deep breath and that's what makes the pain worse. The patient has not had any fevers. She states that she has had endocarditis in the past and that this reminds her of how she felt then. She continues to use IV drugs.       Physical Exam   Triage Vital Signs: ED Triage Vitals  Enc Vitals Group     BP 03/17/22 0838 132/81     Pulse Rate 03/17/22 0838 99     Resp 03/17/22 0838 16     Temp 03/17/22 0838 98.7 F (37.1 C)     Temp src --      SpO2 03/17/22 0838 98 %     Weight 03/17/22 0838 215 lb (97.5 kg)     Height 03/17/22 0838 5\' 3"  (1.6 m)     Head Circumference --      Peak Flow --      Pain Score 03/17/22 0841 8     Pain Loc --      Pain Edu? --      Excl. in [place]? --     Most recent vital signs: Vitals:   03/17/22 0838  BP: 132/81  Pulse: 99  Resp: 16  Temp: 98.7 F (37.1 C)  SpO2: 98%   General: Awake, alert, oriented. CV:  Good peripheral perfusion. Regular rate and rhythm. Resp:  Normal effort. Lungs clear. Abd:  No distention.     ED Results / Procedures / Treatments   Labs (all labs ordered are listed, but only abnormal results are displayed) Labs Reviewed  CBC WITH DIFFERENTIAL/PLATELET - Abnormal; Notable for the following components:      Result Value   WBC 15.4 (*)    RBC 3.69 (*)    Hemoglobin 8.6 (*)    HCT  28.0 (*)    MCV 75.9 (*)    MCH 23.3 (*)    RDW 17.0 (*)    Platelets 531 (*)    Neutro Abs 11.3 (*)    Monocytes Absolute 1.2 (*)    All other components within normal limits  BASIC METABOLIC PANEL - Abnormal; Notable for the following components:   CO2 19 (*)    Glucose, Bld 101 (*)    Calcium 8.6 (*)    All other components within normal limits  RESP PANEL BY RT-PCR (RSV, FLU A&B, COVID)  RVPGX2  CULTURE, BLOOD (ROUTINE X 2)  CULTURE, BLOOD (ROUTINE X 2)  LACTIC ACID, PLASMA  LACTIC ACID, PLASMA  TROPONIN I (HIGH SENSITIVITY)  TROPONIN I (HIGH SENSITIVITY)     EKG  I, Nance Pear, attending physician, personally viewed and interpreted this EKG  EKG Time: 0844 Rate: 105 Rhythm: sinus tachycardia Axis: normal Intervals: qtc 430 QRS: narrow ST changes: no st elevation Impression: abnormal  ekg   RADIOLOGY I independently interpreted and visualized the CXR. My interpretation: No pneumonia Radiology interpretation:  IMPRESSION:  Lower lung volumes.  No acute cardiopulmonary abnormality.    I independently interpreted and visualized the ct angio. My interpretation: No central PE Radiology interpretation:  IMPRESSION:  1. Focal filling defect at the branch point of the left intralobar  artery into the basilar segmental branches compatible with a  pulmonary embolus.  2. Filling defect is suspected within the medial basilar segment on  the right.  3. Airspace opacities at the lung bases, left greater than right  likely related to the pulmonary emboli. Infection is considered less  likely.  4. Small left effusion.  5. Enlarged left axillary node measuring up to 12 mm in short  access. Other smaller axillary nodes are present bilaterally. These  are likely reactive.      PROCEDURES:  Critical Care performed: Yes, see critical care procedure note(s)  Procedures  CRITICAL CARE Performed by: Nance Pear   Total critical care time: 30  minutes  Critical care time was exclusive of separately billable procedures and treating other patients.  Critical care was necessary to treat or prevent imminent or life-threatening deterioration.  Critical care was time spent personally by me on the following activities: development of treatment plan with patient and/or surrogate as well as nursing, discussions with consultants, evaluation of patient's response to treatment, examination of patient, obtaining history from patient or surrogate, ordering and performing treatments and interventions, ordering and review of laboratory studies, ordering and review of radiographic studies, pulse oximetry and re-evaluation of patient's condition.   MEDICATIONS ORDERED IN ED: Medications - No data to display   IMPRESSION / MDM / [place] / ED COURSE  I reviewed the triage vital signs and the nursing notes.                              Differential diagnosis includes, but is not limited to, pneumonia, PE, endocarditis  Patient's presentation is most consistent with acute presentation with potential threat to life or bodily function.   The patient is on the cardiac monitor to evaluate for evidence of arrhythmia and/or significant heart rate changes.  Patient presented to the emergency department today because of concern for left sided chest pain and sob. Blood work with leukocytosis. Given history of PE CT angio was ordered. Slight delay given difficulty obtaining IV access, however once CT was obtained it did show findings concerning for PE. Will plan on admission for IV heparin. Additionally will start IV abx given leukocytosis and airspace disease seen on ct angio.     FINAL CLINICAL IMPRESSION(S) / ED DIAGNOSES   Final diagnoses:  Other pulmonary embolism without acute cor pulmonale, unspecified chronicity ([place])    Note:  This document was prepared using Dragon voice recognition software and may include unintentional dictation  errors.    Nance Pear, MD 03/17/22 1535

## 2022-03-17 NOTE — Assessment & Plan Note (Addendum)
Leukocytosis - It is reassuring that patient's lactic acid and high sensitive troponin were not elevated and that she does not have a fever - Given marked leukocytosis of 15.4 which could may be reactive secondary to PE, however in setting of IV drug use and history of endocarditis, sepsis cannot be excluded at this time - Blood cultures x 2 have been collected and are in process - Check procalcitonin and UA with urine culture - Initiate broad-spectrum antibiotic with cefepime and vancomycin per pharmacy

## 2022-03-17 NOTE — Consult Note (Signed)
Pharmacy Antibiotic Note  Karen Clarke is a 37 y.o. female admitted on 03/17/2022 with sepsis.  Pharmacy has been consulted for vancomycin and cefepime dosing.  Plan: Initiate cefepime 2 gram Q8H Vancomycin 2000 mg x 1 LD Initiate Vancomycin 750 mg Q12H. Goal AUC 400-600 Estimated AUC 429/Cmin: 12.1 Scr 0.81, IBW, Vd 0.5 (BMI 37.9)    Height: 5\' 3"  (160 cm) Weight: 97.5 kg (215 lb) IBW/kg (Calculated) : 52.4  Temp (24hrs), Avg:98.5 F (36.9 C), Min:98.3 F (36.8 C), Max:98.7 F (37.1 C)  Recent Labs  Lab 03/17/22 1030  WBC 15.4*  CREATININE 0.81  LATICACIDVEN 1.4    Estimated Creatinine Clearance: 108.8 mL/min (by C-G formula based on SCr of 0.81 mg/dL).    Allergies  Allergen Reactions   Capsaicin Hives and Itching   Cefazolin Rash    Tolerated cefepime 02/20/20 Negative penicillin skin test 12/27/2020    Antimicrobials this admission: 1/14 cefepime >>  1/14 Vancomycin >>   Dose adjustments this admission:   Microbiology results: 1/14 BCx: sent 1/14 UCx: sent   Thank you for allowing pharmacy to be a part of this patient's care.  Dorothe Pea, PharmD, BCPS Clinical Pharmacist   03/17/2022 3:54 PM

## 2022-03-17 NOTE — Consult Note (Addendum)
ANTICOAGULATION CONSULT NOTE - Initial Consult  Pharmacy Consult for heparin infusion Indication: pulmonary embolus  Allergies  Allergen Reactions   Capsaicin Hives and Itching   Cefazolin Rash    Tolerated cefepime 02/20/20 Negative penicillin skin test 12/27/2020    Patient Measurements: Height: 5\' 3"  (160 cm) Weight: 97.5 kg (215 lb) IBW/kg (Calculated) : 52.4 Heparin Dosing Weight: 75.1 kg  Vital Signs: Temp: 98.3 F (36.8 C) (01/14 1500) Temp Source: Oral (01/14 1500) BP: 129/76 (01/14 1500) Pulse Rate: 96 (01/14 1500)  Labs: Recent Labs    03/17/22 1030 03/17/22 1431  HGB 8.6*  --   HCT 28.0*  --   PLT 531*  --   CREATININE 0.81  --   TROPONINIHS 5 4    Estimated Creatinine Clearance: 108.8 mL/min (by C-G formula based on SCr of 0.81 mg/dL).   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 CTA compatible with a pulmonary embolus. Pharmacy consulted for management of heparin infusion.    Goal of Therapy:  Heparin level 0.3-0.7 units/ml Monitor platelets by anticoagulation protocol: Yes  Date Time aPTT/HL Rate/Comment   Plan:  Order STAT aPTT Give 5300 units bolus x1; then start heparin infusion at 1300 units/hr Check anti-Xa level in 6 hours and daily once consecutively therapeutic. Continue to monitor H&H and platelets daily while on heparin gtt.   [place] Pharmacist 03/17/2022 3:32 PM

## 2022-03-17 NOTE — Assessment & Plan Note (Addendum)
-  Patient states that she takes methadone 100 mg daily - Initial pharmacy verification, she states that she is no longer on it - On my evaluation of her she states that she will need her methadone resumed - I checked [place] and did not see a prescription and I informed her of this finding and she started yelling at me and became very agitated.  She then states that she would like another doctor.  I informed her that it is just based on what I see on PDMP aware.  She started yelling at me again.  I then exited the room and informed her that another provider will see her in the a.m. - Repeat med reconciliation was performed, and patient states that she took methadone 100 mg daily at 5 AM in the morning prior to ED presentation - At this time methadone 100 mg daily has been tentatively resumed for 03/18/2022 - Current pharmacy verification in process for methadone prescription at [facility]

## 2022-03-17 NOTE — Progress Notes (Signed)
Received IV consult for PIV start for CT angio 20 gauge or larger. Assessed patient's bilateral lower& upper arms with ultrasound. No suitable veins found. Primary RN notified.

## 2022-03-17 NOTE — ED Notes (Signed)
MD. Masy at the pt's bedside, the pt has requested to leave AMA at this time. The pt sates she wants more pain management and she receives better pain management at [facility]. The pt has been educated on her admitting diagnoses and the potential life threatening complications of leaving. The pt has acknowledged her understanding, but has continued to request to leave.

## 2022-03-17 NOTE — H&P (Addendum)
History and Physical   Karen Clarke UUV:[id number] DOB: [birth year] DOA: 03/17/2022  PCP: Pcp, No  Patient coming from: home  I have personally briefly reviewed patient's old medical records in [place].  Chief Concern: cough, shortness of breath, chest pain  HPI: Karen Clarke is a 37 year old female with history of endocarditis, current IV drug user, history of PE, who presents emergency department for chief concerns of chest pain and shortness of breath.  Initial vitals in the emergency department showed temperature of 98.3, respiration rate of 18, heart rate of 96, blood pressure 129/76, SpO2 of 96% on room air.  Serum sodium is 136, potassium 4.0, chloride 109, bicarb 19, BUN of 17, serum creatinine of 0.81, eGFR greater than 60, nonfasting blood glucose 101, WBC was 15.4, hemoglobin 8.6, platelets of 531.  Lactic acid was 1-1.4.  High sensitive troponin was 5 and on repeat was 4.  COVID/influenza A/influenza B/RSV PCR were negative.    CTA for PE: Was read as focal filling defect at the branch point of the left interlobar artery into the basilar segmental branches compatible with pulmonary embolus.  Filling defect suspected within the medial basilar segment on the right.  Airspace opacity at the lung bases, left greater than right related to pulmonary emboli.  Infection is considered less likely.  ED treatment ceftriaxone 2 g IV one-time dose, levofloxacin 750 mg IV one-time dose, sodium chloride 1 L bolus.  [facility] admission request for PE. ---------------------------------- At bedside, she is able to tell me her name, age age, current location, current calendar year.  She reports chest pain that started 4 days ago. The pain is worse with inhalation.  She stopped using IV cocaine 4 days ago. She endorses subjective fever and has been taking daily tylenol and ibuprofen for the chills.   Social history: She lives with her boyfriend. She endorses tobacco use, 1/2 ppd,  she started at age 21. She denies etoh. She does uses daily cocaine via injections. She endorses daily methadone use. She does not work right now. She is in the middle of filing for disability.   ROS: Constitutional: no weight change, + fever ENT/Mouth: no sore throat, no rhinorrhea Eyes: no eye pain, no vision changes Cardiovascular: + chest pain, no dyspnea,  no edema, no palpitations Respiratory: no cough, no sputum, no wheezing Gastrointestinal: no nausea, no vomiting, no diarrhea, no constipation Genitourinary: no urinary incontinence, no dysuria, no hematuria Musculoskeletal: no arthralgias, no myalgias Skin: no skin lesions, no pruritus, Neuro: + weakness, no loss of consciousness, no syncope Psych: no anxiety, no depression, + decrease appetite Heme/Lymph: no bruising, no bleeding  ED Course: Discussed with emergency medicine provider, patient requiring hospitalization for chief concerns of bilateral pulmonary embolism.  Assessment/Plan  Principal Problem:   Bilateral pulmonary embolism (HCC) Active Problems:   Hx of bacterial endocarditis   IVDU (intravenous drug user)   Positive hepatitis C antibody test   Polysubstance abuse ([place])   Tobacco abuse   Methadone dependence (HCC)   Assessment and Plan:  * Bilateral pulmonary embolism (HCC) - Continue heparin per pharmacy to treat for PE  IVDU (intravenous drug user) - Patient states she last injected IV drugs 5 days ago.  Over the last 4 days she has not been feeling well and was not able to use IV drugs  Hx of bacterial endocarditis Leukocytosis - It is reassuring that patient's lactic acid and high sensitive troponin were not elevated and that she does not have a  fever - Given marked leukocytosis of 15.4 which could may be reactive secondary to PE, however in setting of IV drug use and history of endocarditis, sepsis cannot be excluded at this time - Blood cultures x 2 have been collected and are in process - Check  procalcitonin and UA with urine culture - Initiate broad-spectrum antibiotic with cefepime and vancomycin per pharmacy  Methadone dependence [facility]) - Patient states that she takes methadone 100 mg daily - Initial pharmacy verification, she states that she is no longer on it - On my evaluation of her she states that she will need her methadone resumed - I checked PDMP database and did not see a prescription and I informed her of this finding and she started yelling at me and became very agitated.  She then states that she would like another doctor.  I informed her that it is just based on what I see on PDMP aware.  She started yelling at me again.  I then exited the room and informed her that another provider will see her in the a.m. - Repeat med reconciliation was performed, and patient states that she took methadone 100 mg daily at 5 AM in the morning prior to ED presentation - At this time methadone 100 mg daily has been tentatively resumed for 03/18/2022 - Current pharmacy verification in process for methadone prescription at [facility]  Tobacco abuse - As needed nicotine patch ordered  Chart reviewed.   DVT prophylaxis: heparin gtt  Code Status: full code Diet: regular diet Family Communication: no Disposition Plan: pending clinical course Consults called: none at this time Admission status: pcu, inpatient  Past Medical History:  Diagnosis Date   Endocarditis    IgA deficiency (HCC)    Lupus (HCC)    Past Surgical History:  Procedure Laterality Date   TEE WITHOUT CARDIOVERSION N/A 02/09/2021   Procedure: TRANSESOPHAGEAL ECHOCARDIOGRAM (TEE);  Surgeon: Antonieta Iba, MD;  Location: [facility] ORS;  Service: Cardiovascular;  Laterality: N/A;   TONSILLECTOMY     Social History:  reports that she has been smoking cigarettes. She has never used smokeless tobacco. She reports current drug use. Drugs: IV and Cocaine. She reports that she does not drink  alcohol.  Allergies  Allergen Reactions   Capsaicin Hives and Itching   Cefazolin Rash    Tolerated cefepime 02/20/20 Negative penicillin skin test 12/27/2020   History reviewed. No pertinent family history. Family history: Family history reviewed and not pertinent.  Prior to Admission medications   Medication Sig Start Date End Date Taking? Authorizing Provider  acetaminophen (TYLENOL) 325 MG tablet Take 2 tablets (650 mg total) by mouth every 6 (six) hours as needed for mild pain (or Fever >/= 101). 04/06/21   Lurene Shadow, MD  ibuprofen (ADVIL) 400 MG tablet Take 2 tablets (800 mg total) by mouth every 8 (eight) hours as needed for moderate pain. 04/06/21   Lurene Shadow, MD  methadone (DOLOPHINE) 10 MG/ML solution Take 100 mg by mouth daily. Patient not taking: Reported on 06/04/2021    [provider]  QUEtiapine (SEROQUEL) 100 MG tablet Take 1 tablet (100 mg total) by mouth at bedtime. Patient not taking: Reported on 03/17/2022 04/06/21   Lurene Shadow, MD  QUEtiapine (SEROQUEL) 100 MG tablet Take 1 tablet (100 mg total) by mouth at bedtime for 7 days. 06/04/21 06/11/21  Merwyn Katos, MD   Physical Exam: Vitals:   03/17/22 8756 03/17/22 1430 03/17/22 1500 03/17/22 1530  BP: 132/81  115/70 129/76 124/74  Pulse: 99 96 96 90  Resp: 16 18 18    Temp: 98.7 F (37.1 C)  98.3 F (36.8 C)   TempSrc:   Oral   SpO2: 98% 95% 96% 95%  Weight: 97.5 kg     Height: 5\' 3"  (1.6 m)      Constitutional: appears age-appropriate, NAD, comfortable Eyes: PERRL, lids and conjunctivae normal ENMT: Mucous membranes are dry. Hearing appropriate Neck: normal, supple, no masses, no thyromegaly Respiratory: Normal respiratory effort. No accessory muscle use.  Cardiovascular: Unable to complete as patient is yelling Abdomen: Morbidly obese. She declined further examination  Musculoskeletal: no clubbing / cyanosis. No joint deformity upper and lower extremities. Good ROM, no contractures, no  atrophy. Normal muscle tone.  Skin: no rashes, lesions, ulcers on visible skin with her cloths on. She declined further examinations Neurologic: Patient declined further physical examination. She was able to move all her extremities Psychiatric:  Alert and oriented x 3. Agitated mood.   EKG: independently reviewed, showing sinus tachycardia with rate of 105, QTc 430  Chest x-ray on Admission: I personally reviewed and I agree with radiologist reading as below.  CT Angio Chest PE W and/or Wo Contrast  Result Date: 03/17/2022 CLINICAL DATA:  Left-sided chest pain. Pain when taking a deep breath. EXAM: CT ANGIOGRAPHY CHEST WITH CONTRAST TECHNIQUE: Multidetector CT imaging of the chest was performed using the standard protocol during bolus administration of intravenous contrast. Multiplanar CT image reconstructions and MIPs were obtained to evaluate the vascular anatomy. RADIATION DOSE REDUCTION: This exam was performed according to the departmental dose-optimization program which includes automated exposure control, adjustment of the mA and/or kV according to patient size and/or use of iterative reconstruction technique. CONTRAST:  58mL OMNIPAQUE IOHEXOL 350 MG/ML SOLN COMPARISON:  Two-view chest x-ray 03/17/2022. CT angio chest 02/07/2021 FINDINGS: Cardiovascular: The heart size is normal. Aorta and great vessel origins are within normal limits. Pulmonary artery opacification is satisfactory. Focal filling defect is present at the branch point of the left intralobar artery into the basilar segmental branches, best seen on image 171 of series 6. Contrast opacification over the inferior lungs is suboptimal. Filling defect is suspected within the medial basilar segment on the right is concerning for additional small emboli. RV:LV ratio is within normal limits. Mediastinum/Nodes: Subcentimeter paratracheal nodes are present. No significant mediastinal, hilar nodes are present. A left axillary node measures up  to 12 mm in short access. Other smaller axillary nodes are present bilaterally. Lungs/Pleura: Airspace opacities are present at the lung bases, left greater than right. A small left effusion is present. No nodule or mass lesion is present. No pneumothorax is present. Upper Abdomen: Visualized upper abdomen is unremarkable. Musculoskeletal: No chest wall abnormality. No acute or significant osseous findings. Review of the MIP images confirms the above findings. IMPRESSION: 1. Focal filling defect at the branch point of the left intralobar artery into the basilar segmental branches compatible with a pulmonary embolus. 2. Filling defect is suspected within the medial basilar segment on the right. 3. Airspace opacities at the lung bases, left greater than right likely related to the pulmonary emboli. Infection is considered less likely. 4. Small left effusion. 5. Enlarged left axillary node measuring up to 12 mm in short access. Other smaller axillary nodes are present bilaterally. These are likely reactive. Electronically Signed   By: 03/19/2022 M.D.   On: 03/17/2022 15:13   DG Chest 2 View  Result Date: 03/17/2022 CLINICAL DATA:  37 year old female  with chest pain and shortness of breath. History of endocarditis. EXAM: CHEST - 2 VIEW COMPARISON:  Chest radiographs 06/04/2021 and earlier. FINDINGS: Mildly lower lung volumes. Mediastinal contours remain normal. Visualized tracheal air column is within normal limits. Lung markings are stable, within normal limits. No pneumothorax or pleural effusion. No acute pulmonary opacity. No acute osseous abnormality identified. Negative visible bowel gas pattern. IMPRESSION: Lower lung volumes.  No acute cardiopulmonary abnormality. Electronically Signed   By: Genevie Ann M.D.   On: 03/17/2022 10:21    Labs on Admission: I have personally reviewed following labs  CBC: Recent Labs  Lab 03/17/22 1030  WBC 15.4*  NEUTROABS 11.3*  HGB 8.6*  HCT 28.0*  MCV 75.9*   PLT 675*   Basic Metabolic Panel: Recent Labs  Lab 03/17/22 1030  NA 136  K 4.0  CL 109  CO2 19*  GLUCOSE 101*  BUN 17  CREATININE 0.81  CALCIUM 8.6*   GFR: Estimated Creatinine Clearance: 108.8 mL/min (by C-G formula based on SCr of 0.81 mg/dL).  Urine analysis:    Component Value Date/Time   COLORURINE YELLOW (A) 03/17/2022 1558   APPEARANCEUR HAZY (A) 03/17/2022 1558   LABSPEC 1.039 (H) 03/17/2022 1558   PHURINE 5.0 03/17/2022 1558   GLUCOSEU NEGATIVE 03/17/2022 1558   HGBUR SMALL (A) 03/17/2022 1558   BILIRUBINUR NEGATIVE 03/17/2022 1558   KETONESUR NEGATIVE 03/17/2022 1558   PROTEINUR 30 (A) 03/17/2022 1558   UROBILINOGEN 0.2 02/21/2009 1848   NITRITE NEGATIVE 03/17/2022 1558   LEUKOCYTESUR NEGATIVE 03/17/2022 1558   This document was prepared using Dragon Voice Recognition software and may include unintentional dictation errors.  Dr. Tobie Poet [facility]  If 7PM-7AM, please contact overnight-coverage provider If 7AM-7PM, please contact day coverage provider [email or web address]  03/17/2022, 6:48 PM

## 2022-03-18 ENCOUNTER — Inpatient Hospital Stay (HOSPITAL_COMMUNITY)
Admit: 2022-03-18 | Discharge: 2022-03-18 | Disposition: A | Discharge status: Home / Self Care | Payer: Medicaid Other | Attending: Internal Medicine | Admitting: Internal Medicine

## 2022-03-18 ENCOUNTER — Encounter: Payer: Self-pay | Admitting: Internal Medicine

## 2022-03-18 ENCOUNTER — Other Ambulatory Visit (HOSPITAL_COMMUNITY): Payer: Self-pay

## 2022-03-18 ENCOUNTER — Inpatient Hospital Stay: Payer: Medicaid Other

## 2022-03-18 DIAGNOSIS — R0602 Shortness of breath: Secondary | ICD-10-CM

## 2022-03-18 DIAGNOSIS — R7881 Bacteremia: Secondary | ICD-10-CM | POA: Diagnosis not present

## 2022-03-18 DIAGNOSIS — I330 Acute and subacute infective endocarditis: Secondary | ICD-10-CM

## 2022-03-18 DIAGNOSIS — F1990 Other psychoactive substance use, unspecified, uncomplicated: Secondary | ICD-10-CM | POA: Diagnosis not present

## 2022-03-18 DIAGNOSIS — B957 Other staphylococcus as the cause of diseases classified elsewhere: Secondary | ICD-10-CM

## 2022-03-18 DIAGNOSIS — D638 Anemia in other chronic diseases classified elsewhere: Secondary | ICD-10-CM | POA: Diagnosis not present

## 2022-03-18 DIAGNOSIS — R079 Chest pain, unspecified: Secondary | ICD-10-CM

## 2022-03-18 DIAGNOSIS — F1910 Other psychoactive substance abuse, uncomplicated: Secondary | ICD-10-CM | POA: Diagnosis not present

## 2022-03-18 DIAGNOSIS — Z8679 Personal history of other diseases of the circulatory system: Secondary | ICD-10-CM | POA: Diagnosis not present

## 2022-03-18 DIAGNOSIS — I2699 Other pulmonary embolism without acute cor pulmonale: Secondary | ICD-10-CM

## 2022-03-18 LAB — COMPREHENSIVE METABOLIC PANEL
ALT: 34 U/L (ref 0–44)
AST: 24 U/L (ref 15–41)
Albumin: 3.1 g/dL — ABNORMAL LOW (ref 3.5–5.0)
Alkaline Phosphatase: 179 U/L — ABNORMAL HIGH (ref 38–126)
Anion gap: 9 (ref 5–15)
BUN: 14 mg/dL (ref 6–20)
CO2: 19 mmol/L — ABNORMAL LOW (ref 22–32)
Calcium: 7.9 mg/dL — ABNORMAL LOW (ref 8.9–10.3)
Chloride: 110 mmol/L (ref 98–111)
Creatinine, Ser: 0.75 mg/dL (ref 0.44–1.00)
GFR, Estimated: >60 mL/min (ref >60)
Glucose, Bld: 110 mg/dL — ABNORMAL HIGH (ref 70–99)
Potassium: 3.5 mmol/L (ref 3.5–5.1)
Sodium: 138 mmol/L (ref 135–145)
Total Bilirubin: 0.3 mg/dL (ref 0.3–1.2)
Total Protein: 7.1 g/dL (ref 6.5–8.1)

## 2022-03-18 LAB — MRSA NEXT GEN BY PCR, NASAL: MRSA by PCR Next Gen: DETECTED — AB

## 2022-03-18 LAB — CBC
HCT: 26.1 % — ABNORMAL LOW (ref 36.0–46.0)
Hemoglobin: 7.5 g/dL — ABNORMAL LOW (ref 12.0–15.0)
MCH: 22.7 pg — ABNORMAL LOW (ref 26.0–34.0)
MCHC: 28.7 g/dL — ABNORMAL LOW (ref 30.0–36.0)
MCV: 78.9 fL — ABNORMAL LOW (ref 80.0–100.0)
Platelets: 454 10*3/uL — ABNORMAL HIGH (ref 150–400)
RBC: 3.31 MIL/uL — ABNORMAL LOW (ref 3.87–5.11)
RDW: 17.2 % — ABNORMAL HIGH (ref 11.5–15.5)
WBC: 11.6 10*3/uL — ABNORMAL HIGH (ref 4.0–10.5)
nRBC: 0 % (ref 0.0–0.2)

## 2022-03-18 LAB — ECHOCARDIOGRAM COMPLETE
AR max vel: 2.71 cm2
AV Area VTI: 2.4 cm2
AV Area mean vel: 2.43 cm2
AV Mean grad: 8 mmHg
AV Peak grad: 16.3 mmHg
Ao pk vel: 2.02 m/s
Area-P 1/2: 4.52 cm2
Height: 63 in
MV VTI: 3.18 cm2
S' Lateral: 2.7 cm
Weight: 3440 oz

## 2022-03-18 LAB — BLOOD CULTURE ID PANEL (REFLEXED) - BCID2

## 2022-03-18 LAB — HIV ANTIBODY (ROUTINE TESTING W REFLEX): HIV Screen 4th Generation wRfx: NONREACTIVE

## 2022-03-18 LAB — HEPARIN LEVEL (UNFRACTIONATED): Heparin Unfractionated: 0.21 IU/mL — ABNORMAL LOW (ref 0.30–0.70)

## 2022-03-18 LAB — URINE CULTURE: Culture: <10000 — AB

## 2022-03-18 LAB — SEDIMENTATION RATE: Sed Rate: 107 mm/hr — ABNORMAL HIGH (ref 0–20)

## 2022-03-18 LAB — PROCALCITONIN: Procalcitonin: 0.29 ng/mL

## 2022-03-18 LAB — C-REACTIVE PROTEIN: CRP: 20.5 mg/dL — ABNORMAL HIGH (ref <1.0)

## 2022-03-18 MED ORDER — VANCOMYCIN HCL IN DEXTROSE 1-5 GM/200ML-% IV SOLN
1000.0000 mg | Freq: Two times a day (BID) | INTRAVENOUS | Status: DC
  Administered 2022-03-18 – 2022-03-20 (×4): 1000 mg via INTRAVENOUS
  Filled 2022-03-18 (×6): qty 200

## 2022-03-18 MED ORDER — APIXABAN 5 MG PO TABS: 5.0000 mg | ORAL_TABLET | Freq: Two times a day (BID) | ORAL | Status: DC

## 2022-03-18 MED ORDER — APIXABAN 5 MG PO TABS
10.0000 mg | ORAL_TABLET | Freq: Two times a day (BID) | ORAL | Status: DC
  Administered 2022-03-18 – 2022-03-19 (×3): 10 mg via ORAL
  Filled 2022-03-18 (×3): qty 2

## 2022-03-18 MED ORDER — HEPARIN BOLUS VIA INFUSION
2300.0000 [IU] | Freq: Once | INTRAVENOUS | Status: AC
  Administered 2022-03-18: 2300 [IU] via INTRAVENOUS
  Filled 2022-03-18: qty 2300

## 2022-03-18 MED ORDER — SODIUM CHLORIDE 0.9 % IV SOLN
2.0000 g | Freq: Three times a day (TID) | INTRAVENOUS | Status: DC
  Administered 2022-03-18 – 2022-03-20 (×6): 2 g via INTRAVENOUS
  Filled 2022-03-18: qty 12.5
  Filled 2022-03-18: qty 2
  Filled 2022-03-18 (×2): qty 12.5
  Filled 2022-03-18: qty 2
  Filled 2022-03-18: qty 12.5
  Filled 2022-03-18: qty 2
  Filled 2022-03-18: qty 12.5

## 2022-03-18 NOTE — Progress Notes (Signed)
PHARMACY - PHYSICIAN COMMUNICATION CRITICAL VALUE ALERT - BLOOD CULTURE IDENTIFICATION (BCID)  Karen Clarke is an 37 y.o. female who presented to [facility] on 03/17/2022 with a chief complaint of cough, shortness of breath, chest pain  Assessment:  Blood cultures from 1/14 currently GPC in 1 of 4 bottles, BCID detects MRSE.  Found to have PE.  Does have PMH of IVDU and endocarditis with enterococcus in Dec 2022. H/o leaving hospital AMA.    Name of physician (or Provider) Contacted: Dr Algis Liming  Current antibiotics: none...vancomycin and cefepime just stopped  Changes to prescribed antibiotics recommended: resuming vancomycin (no doses missed) and consulting ID   Results for orders placed or performed during the hospital encounter of 03/17/22  Blood Culture ID Panel (Reflexed) (Collected: 03/17/2022 10:30 AM)  Result Value Ref Range   Enterococcus faecalis NOT DETECTED NOT DETECTED   Enterococcus Faecium NOT DETECTED NOT DETECTED   Listeria monocytogenes NOT DETECTED NOT DETECTED   Staphylococcus species DETECTED (A) NOT DETECTED   Staphylococcus aureus (BCID) NOT DETECTED NOT DETECTED   Staphylococcus epidermidis DETECTED (A) NOT DETECTED   Staphylococcus lugdunensis NOT DETECTED NOT DETECTED   Streptococcus species NOT DETECTED NOT DETECTED   Streptococcus agalactiae NOT DETECTED NOT DETECTED   Streptococcus pneumoniae NOT DETECTED NOT DETECTED   Streptococcus pyogenes NOT DETECTED NOT DETECTED   A.calcoaceticus-baumannii NOT DETECTED NOT DETECTED   Bacteroides fragilis NOT DETECTED NOT DETECTED   Enterobacterales NOT DETECTED NOT DETECTED   Enterobacter cloacae complex NOT DETECTED NOT DETECTED   Escherichia coli NOT DETECTED NOT DETECTED   Klebsiella aerogenes NOT DETECTED NOT DETECTED   Klebsiella oxytoca NOT DETECTED NOT DETECTED   Klebsiella pneumoniae NOT DETECTED NOT DETECTED   Proteus species NOT DETECTED NOT DETECTED   Salmonella species NOT DETECTED NOT DETECTED    Serratia marcescens NOT DETECTED NOT DETECTED   Haemophilus influenzae NOT DETECTED NOT DETECTED   Neisseria meningitidis NOT DETECTED NOT DETECTED   Pseudomonas aeruginosa NOT DETECTED NOT DETECTED   Stenotrophomonas maltophilia NOT DETECTED NOT DETECTED   Candida albicans NOT DETECTED NOT DETECTED   Candida auris NOT DETECTED NOT DETECTED   Candida glabrata NOT DETECTED NOT DETECTED   Candida krusei NOT DETECTED NOT DETECTED   Candida parapsilosis NOT DETECTED NOT DETECTED   Candida tropicalis NOT DETECTED NOT DETECTED   Cryptococcus neoformans/gattii NOT DETECTED NOT DETECTED   Methicillin resistance mecA/C DETECTED (A) NOT DETECTED   Doreene Eland, PharmD, BCPS, BCIDP Work Cell: [phone] 03/18/2022 8:13 AM

## 2022-03-18 NOTE — Consult Note (Signed)
ANTICOAGULATION CONSULT NOTE  Pharmacy Consult for heparin infusion Indication: pulmonary embolus  Allergies  Allergen Reactions   Capsaicin Hives and Itching   Cefazolin Rash    Tolerated cefepime 02/20/20 Negative penicillin skin test 12/27/2020    Patient Measurements: Height: 5\' 3"  (160 cm) Weight: 97.5 kg (215 lb) IBW/kg (Calculated) : 52.4 Heparin Dosing Weight: 75.1 kg  Vital Signs: Temp: 98.3 F (36.8 C) (01/14 2131) Temp Source: Oral (01/14 2131) BP: 114/59 (01/14 2130) Pulse Rate: 106 (01/14 2130)  Labs: Recent Labs    03/17/22 1030 03/17/22 1431 03/17/22 2330  HGB 8.6*  --   --   HCT 28.0*  --   --   PLT 531*  --   --   HEPARINUNFRC  --   --  <0.10*  CREATININE 0.81  --   --   TROPONINIHS 5 4  --      Estimated Creatinine Clearance: 108.8 mL/min (by C-G formula based on SCr of 0.81 mg/dL).   Medical History: Past Medical History:  Diagnosis Date   Endocarditis    IgA deficiency (HCC)    Lupus ([place])     Medications:  PTA: N/A Inpatient: Heparin infusion 1/14 >> Allergies: No AC/APT related allergies  Assessment: 37 year old female with history of bacterial endocarditis (10/2018), and IVDU presents to ED with cough and pain associated with takking a deep breath. CTA compatible with a pulmonary embolus. Pharmacy consulted for management of heparin infusion.    Goal of Therapy:  Heparin level 0.3-0.7 units/ml Monitor platelets by anticoagulation protocol: Yes  Date Time aPTT/HL Rate/Comment 1/14 2330 HL <0.1 Subtherapeutic  Plan:  Give 2300 units bolus x1 Increase heparin infusion to 1550 units/hr Recheck HL in 6 hr after rate change CBC daily while on heparin  Renda Rolls, PharmD, [facility] 03/18/2022 12:22 AM

## 2022-03-18 NOTE — Consult Note (Signed)
Pharmacy Antibiotic Note  Karen Clarke is a 37 y.o. female admitted on 03/17/2022 with sepsis with concerns for MRSE bacteremia in setting of IVDU and previous bacterial endocarditis.  Pharmacy has been consulted for vancomycin dosing.  Today, 03/18/2022 Day 1 vancomycin Renal: SCr WNL and at baseline WBC improved  Afebrile Blood cx from 1/14 with GPC in 1 of 4 bottles, BCID = MRSE Vancomycin and cefepime stopped momentarily until blood cultures results became known and now resuming vancomycin   Plan: Resuming vancomycin at 1gm IV q12h.  This dose was previously given at [facility] and levels acceptable.   Goal AUC 400-600 Estimated AUC 566/Cmin: 15.9 Scr 0.8, IBW, Vd 0.5 (BMI 37.9)   Follow renal function, cultures and need to check vancomycin levels based on duration of antibiotics ECHO ordered  Height: 5\' 3"  (160 cm) Weight: 97.5 kg (215 lb) IBW/kg (Calculated) : 52.4  Temp (24hrs), Avg:98.3 F (36.8 C), Min:98.3 F (36.8 C), Max:98.3 F (36.8 C)  Recent Labs  Lab 03/17/22 1030 03/17/22 2321 03/18/22 0511  WBC 15.4*  --  11.6*  CREATININE 0.81  --  0.75  LATICACIDVEN 1.4 1.6  --      Estimated Creatinine Clearance: 110.1 mL/min (by C-G formula based on SCr of 0.75 mg/dL).    Allergies  Allergen Reactions   Capsaicin Hives and Itching   Cefazolin Rash    Tolerated cefepime 02/20/20 Negative penicillin skin test 12/27/2020    Antimicrobials this admission: 1/14 cefepime >> 1/15 1/14 Vancomycin >>   Dose adjustments this admission:   Microbiology results: 1/14 BCx: 1/4 GPC, MRSE 1/14 UCx: sent   Thank you for allowing pharmacy to be a part of this patient's care.  Doreene Eland, PharmD, BCPS, BCIDP Work Cell: [phone] 03/18/2022 9:01 AM

## 2022-03-18 NOTE — Progress Notes (Signed)
MEDICATION RELATED NOTE - FOLLOW UP   Pharmacy Follow Up for Methadone dose   Allergies  Allergen Reactions   Capsaicin Hives and Itching   Cefazolin Rash    Tolerated cefepime 02/20/20 Negative penicillin skin test 12/27/2020   Medications:  Methadone dosage   Assessment: Called [facility] to verify patient dosage.  Patient takes Methadone 110mg  daily   Paulina Fusi, PharmD, BCPS 03/18/2022 9:12 AM

## 2022-03-18 NOTE — Progress Notes (Signed)
*  PRELIMINARY RESULTS* Echocardiogram 2D Echocardiogram has been performed.  Karen Clarke 03/18/2022, 11:20 AM

## 2022-03-18 NOTE — Progress Notes (Signed)
ANTICOAGULATION CONSULT NOTE - Initial Consult  Pharmacy Consult for Apixaban Indication: pulmonary embolus  Allergies  Allergen Reactions   Capsaicin Hives and Itching   Cefazolin Rash    Tolerated cefepime 02/20/20 Negative penicillin skin test 12/27/2020    Patient Measurements: Height: 5\' 3"  (160 cm) Weight: 97.5 kg (215 lb) IBW/kg (Calculated) : 52.4 Heparin Dosing Weight:   Vital Signs: Temp: 98.3 F (36.8 C) (01/14 2131) Temp Source: Oral (01/14 2131) BP: 106/56 (01/15 0321) Pulse Rate: 95 (01/15 0321)  Labs: Recent Labs    03/17/22 1030 03/17/22 1431 03/17/22 2330 03/18/22 0511  HGB 8.6*  --   --  7.5*  HCT 28.0*  --   --  26.1*  PLT 531*  --   --  454*  HEPARINUNFRC  --   --  <0.10* 0.21*  CREATININE 0.81  --   --  0.75  TROPONINIHS 5 4  --   --     Estimated Creatinine Clearance: 110.1 mL/min (by C-G formula based on SCr of 0.75 mg/dL).   Medical History: Past Medical History:  Diagnosis Date   Endocarditis    IgA deficiency ([place])    Lupus [facility])    Assessment: 37 year old female with history of bacterial endocarditis (10/2018), and IVDU presents to ED with cough and pain associated with takking a deep breath. CTA compatible with a pulmonary embolus. Patient has been on Heparin infusion, pharmacy consulted to transition patient to Apixaban.   Plan:  Will discontinue Heparin infusion and begin Apixaban 10mg  twice daily for 7 days then 5mg  twice daily. Will ask TOC pharmacy to check on copays.  Paulina Fusi, PharmD, BCPS 03/18/2022 8:57 AM

## 2022-03-18 NOTE — Progress Notes (Signed)
PROGRESS NOTE   Karen Clarke  ZOX:[id number]    DOB: December 07, 1985    DOA: 03/17/2022  PCP: Pcp, No   I have briefly reviewed patients previous medical records in [facility].  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  Admitted for bilateral PE noted on CTA chest, leukocytosis (working up for recurrent endocarditis).   Assessment & Plan:  Principal Problem:   Bilateral pulmonary embolism (HCC) Active Problems:   Hx of bacterial endocarditis   IVDU (intravenous drug user)   Positive hepatitis C antibody test   Polysubstance abuse (HCC)   Tobacco abuse   Methadone dependence (HCC)   Bilateral acute pulmonary embolism/pleuritic chest pain: CTA chest 1/14: Focal filling defect at the branch point of the left interlobar artery into the basilar segmental branches compatible with PE.  Filling defect within the medial basilar segment on the right.  Airspace opacities at lung bases, left greater than right also felt to be related to PE.  Patient reports having clots in the lungs before but never being on anticoagulation (?  Septic pulmonary emboli).  Has a copper T IUD and not on hormonal supplements.  No recent history of long distance travel.  Does have history of  ongoing IVDA and lupus, both of which could be precipitating factors.  No mention of right heart strain on CT.  Mild sinus tachycardia but no hypoxia.  Check lower extremity venous Doppler, 2D echo.  Transition IV heparin to oral anticoagulation.  Discussed in detail with patient regarding risks, benefits and adverse effects of anticoagulation, choices including warfarin, Eliquis, Xarelto and Pradaxa.  Patient opted for Eliquis.  Compliance was extensively counseled.  Chest pain may be related to PE.  Leukocytosis/Recurrent bacterial endocarditis: Presented with WBC of 15.4 which is improved to 11.6.  Lactate normal.  Procalcitonin 0.35 > 0.29.  Influenza A, B and SARS coronavirus are 2 PCR negative.  UA not indicative of UTI.  Although her leukocytosis may be reactive versus due to chronic subacute cellulitis ongoing and bilateral forearms from ongoing IVDA, bilateral axillary lymphadenitis related to this.  However need to rule out endocarditis given prior history.  BC ID positive for Staphylococcus epidermidis.  Follow blood culture results to completion.  Continue IV vancomycin.  Follow TTE, may need TEE.  ID consulted.  Polysubstance abuse (IVDA, THC, tobacco): Cessation counseled.  Nicotine patch.  Patient has been counseled that opioids will be used judiciously with out unnecessary escalation.  She reports that Toradol is actually working well.  Opioid use disorder on chronic methadone: Follows with [facility] 6 days a week for her methadone treatment.  Outpatient follow-up with the clinic.  Continue home dose of methadone.  Chronic left leg wound: Patient reports that this is due to lupus related pyoderma gangrenosum.  Reportedly was healing  until she bumped her leg onto her bed recently.  Examined and no clinical concern for acute infection.  [place] RN consulted.  Outpatient follow-up with [facility] wound care clinic.  Patient has extensive induration bilateral dorsal foot warmth at  site of IVDA.  SLE: Does not appear to be on any meds PTA.  Outpatient follow-up.  Subacute on chronic microcytic anemia: Baseline hemoglobin may be in the 8 g range.  Presented with hemoglobin of 8.6 which is dropped to 7.5, likely related to hemodilution.  No active bleeding reported.  May have underlying anemia of chronic disease but other etiologies will need to be ruled out as outpatient.  Follow CBC daily and transfuse for hemoglobin 7 g or less.  Body mass index is 38.09 kg/m./Obesity: Complicates overall care and prognosis.  Outpatient follow-up.  Failure to thrive: Unfortunate situation in this young patient, mostly due to polysubstance abuse.   DVT prophylaxis: Place TED hose Start: 03/17/22 1533 currently on IV heparin drip, planning to transition to Eliquis.   Code Status: Full Code:  Family Communication: None at bedside. Disposition:  Status is: Inpatient Remains inpatient appropriate because: Acute PE with pleuritic chest pain.  Workup ongoing.  Concern for recurrent endocarditis.     Consultants:   Infectious disease  Procedures:     Antimicrobials:      Subjective:  Seen this morning while still in the ED.  Seen along with patient's female RN in the room as chaperone.  Reports left-sided chest and neck pain for 4 to 5 days, worse with deep inspiration, severe at times.  Associated dyspnea on exertion, unable to easily climb stairs at home.  Chills.  Unable to say if she had fevers because has been taking frequent doses of Tylenol at home.  Left leg wound recently worsened after trauma.  She says that she actually came to the ED due to concern for recurrent endocarditis.  Objective:   Vitals:   03/17/22 1730 03/17/22 2130 03/17/22 2131 03/18/22 0321  BP: 116/72 (!) 114/59  (!) 106/56  Pulse: 87 (!) 106  95  Resp: (!) 21 (!) 22  19  Temp:   98.3 F (36.8 C)   TempSrc:   Oral   SpO2: 98% 98%  98%  Weight:      Height:        General exam: Young  female, moderately built and obese lying propped up in bed with mild DOE.  Does not appear to be in painful distress. Respiratory system: Occasional basal crackles but otherwise clear to auscultation.  No increased work of breathing. Cardiovascular system: S1 & S2 heard, RRR. No JVD, murmurs, rubs, gallops or clicks. No pedal edema.  Was not on telemetry, requested ED RN that she be placed on telemetry as soon as possible. Gastrointestinal system: Abdomen is nondistended, soft and nontender. No organomegaly or masses felt. Normal bowel sounds heard. Central nervous system: Alert and oriented. No focal neurological deficits. Extremities: Symmetric 5 x 5 power. Skin: Large tattoo on anterior abdominal wall.  Bilateral dorsal forearms with extensive induration likely from prior abscesses.  Has tiny pinhole lesions on the distal dorsal forearm on the left but no obvious drainage.  Large ulcer on left shin, covering dressing soiled just like the ones on bilateral forearms.  No evidence of acute infection or drainage. Psychiatry: Judgement and insight appear normal. Mood & affect appropriate.     Data Reviewed:   I have personally reviewed following labs and imaging studies   CBC:  Recent Labs  Lab 03/17/22 1030 03/18/22 0511  WBC 15.4* 11.6*  NEUTROABS 11.3*  --   HGB 8.6* 7.5*  HCT 28.0* 26.1*  MCV 75.9* 78.9*  PLT 531* 454*    Basic Metabolic Panel: Recent Labs  Lab 03/17/22 1030 03/18/22 0511  NA 136 138  K 4.0 3.5  CL 109 110  CO2 19* 19*  GLUCOSE 101* 110*  BUN 17 14  CREATININE 0.81 0.75  CALCIUM 8.6* 7.9*    Liver Function Tests: Recent Labs  Lab 03/18/22 0511  AST 24  ALT 34  ALKPHOS 179*  BILITOT 0.3  PROT 7.1  ALBUMIN 3.1*    CBG: No results for input(s): "GLUCAP" in the last 168 hours.  Microbiology Studies:   Recent Results (from the past 240 hour(s))  Blood culture (routine x 2)     Status: None (Preliminary result)   Collection Time: 03/17/22  10:30 AM   Specimen: BLOOD  Result Value Ref Range Status   Specimen Description BLOOD BLOOD RIGHT FOREARM  Final   Special Requests   Final    BOTTLES DRAWN AEROBIC AND ANAEROBIC Blood Culture adequate volume   Culture  Setup Time   Final    GRAM POSITIVE COCCI Organism ID to follow AEROBIC BOTTLE ONLY CRITICAL RESULT CALLED TO, READ BACK BY AND VERIFIED WITH: [facility] MITCHELLE 03/18/22 0719 MW Performed at [facility], [address] [place], [place] [address]    Culture GRAM POSITIVE COCCI  Final   Report Status PENDING  Incomplete  Blood culture (routine x 2)     Status: None (Preliminary result)   Collection Time: 03/17/22 10:30 AM   Specimen: BLOOD  Result Value Ref Range Status   Specimen Description BLOOD BLOOD RIGHT ARM  Final   Special Requests   Final    BOTTLES DRAWN AEROBIC AND ANAEROBIC Blood Culture adequate volume   Culture   Final    NO GROWTH < 24 HOURS Performed at [facility], [address], [place], [place] [address]    Report Status PENDING  Incomplete  Resp panel by RT-PCR (RSV, Flu A&B, Covid) Anterior Nasal Swab     Status: None   Collection Time: 03/17/22 10:30 AM   Specimen: Anterior Nasal Swab  Result Value Ref Range Status   SARS Coronavirus 2 by RT PCR NEGATIVE NEGATIVE Final    Comment: (NOTE) SARS-CoV-2 target nucleic acids are NOT DETECTED.  The SARS-CoV-2 RNA is generally detectable in upper respiratory specimens during the acute phase of infection. The lowest concentration of SARS-CoV-2 viral copies this assay can detect is 138 copies/mL. A negative result does not preclude SARS-Cov-2 infection and should not be used as the sole basis for treatment or other patient management decisions. A negative result may occur with  improper specimen collection/handling, submission of specimen other than nasopharyngeal swab, presence of viral mutation(s) within the areas targeted by this assay, and inadequate number of  viral copies(<138 copies/mL). A negative result must be combined with clinical observations, patient history, and epidemiological information. The expected result is Negative.  Fact Sheet for Patients:  BloggerCourse.com  Fact Sheet for Healthcare Providers:  SeriousBroker.it  This test is no t yet approved or cleared by the [place] FDA and  has been authorized for detection and/or diagnosis of SARS-CoV-2 by FDA under an Emergency Use Authorization (EUA). This EUA will remain  in effect (meaning this test can be used) for the duration of the COVID-19 declaration under Section 564(b)(1) of the  Act, 21 U.S.C.section 360bbb-3(b)(1), unless the authorization is terminated  or revoked sooner.       Influenza A by PCR NEGATIVE NEGATIVE Final   Influenza B by PCR NEGATIVE NEGATIVE Final    Comment: (NOTE) The Xpert Xpress SARS-CoV-2/FLU/RSV plus assay is intended as an aid in the diagnosis of influenza from Nasopharyngeal swab specimens and should not be used as a sole basis for treatment. Nasal washings and aspirates are unacceptable for Xpert Xpress SARS-CoV-2/FLU/RSV testing.  Fact Sheet for Patients: BloggerCourse.com  Fact Sheet for Healthcare Providers: SeriousBroker.it  This test is not yet approved or cleared by the [place] FDA and has been authorized for detection and/or diagnosis of SARS-CoV-2 by FDA under an Emergency Use Authorization (EUA). This EUA will remain in effect (meaning this test can be used) for the duration of the COVID-19 declaration under Section 564(b)(1) of the Act, 21 U.S.C. section 360bbb-3(b)(1), unless the authorization is terminated or revoked.     Resp Syncytial Virus by PCR NEGATIVE NEGATIVE Final    Comment: (NOTE) Fact Sheet for Patients: BloggerCourse.com  Fact Sheet for Healthcare  Providers: SeriousBroker.it  This test is not yet approved or cleared by the [place] FDA and has been authorized for detection and/or diagnosis of SARS-CoV-2 by FDA under an Emergency Use Authorization (EUA). This EUA will remain in effect (meaning this test can be used) for the duration of the COVID-19 declaration under Section 564(b)(1) of the Act, 21 U.S.C. section 360bbb-3(b)(1), unless the authorization is terminated or revoked.  Performed at [facility], [address] [place], [place] [address]   Blood Culture ID Panel (Reflexed)     Status: Abnormal   Collection Time: 03/17/22 10:30 AM  Result Value Ref Range Status   Enterococcus faecalis NOT DETECTED NOT DETECTED Final   Enterococcus Faecium NOT DETECTED NOT DETECTED Final   Listeria monocytogenes NOT DETECTED NOT DETECTED Final   Staphylococcus species DETECTED (A) NOT DETECTED Final    Comment: CRITICAL RESULT CALLED TO, READ BACK BY AND VERIFIED WITH: DEVAN MITCHELLE 03/18/22 0719 MW    Staphylococcus aureus (BCID) NOT DETECTED NOT DETECTED Final   Staphylococcus epidermidis DETECTED (A) NOT DETECTED Final    Comment: Methicillin (oxacillin) resistant coagulase negative staphylococcus. Possible blood culture contaminant (unless isolated from more than one blood culture draw or clinical case suggests pathogenicity). No antibiotic treatment is indicated for blood  culture contaminants. CRITICAL RESULT CALLED TO, READ BACK BY AND VERIFIED WITH: DEVAN MITCHELLE 03/18/22 0719 MW    Staphylococcus lugdunensis NOT DETECTED NOT DETECTED Final   Streptococcus species NOT DETECTED NOT DETECTED Final   Streptococcus agalactiae NOT DETECTED NOT DETECTED Final   Streptococcus pneumoniae NOT DETECTED NOT DETECTED Final   Streptococcus pyogenes NOT DETECTED NOT DETECTED Final   A.calcoaceticus-baumannii NOT DETECTED NOT DETECTED Final   Bacteroides fragilis NOT DETECTED NOT DETECTED Final    Enterobacterales NOT DETECTED NOT DETECTED Final   Enterobacter cloacae complex NOT DETECTED NOT DETECTED Final   Escherichia coli NOT DETECTED NOT DETECTED Final   Klebsiella aerogenes NOT DETECTED NOT DETECTED Final   Klebsiella oxytoca NOT DETECTED NOT DETECTED Final   Klebsiella pneumoniae NOT DETECTED NOT DETECTED Final   Proteus species NOT DETECTED NOT DETECTED Final   Salmonella species NOT DETECTED NOT DETECTED Final   Serratia marcescens NOT DETECTED NOT DETECTED Final   Haemophilus influenzae NOT DETECTED NOT DETECTED Final   Neisseria meningitidis NOT DETECTED NOT DETECTED Final   Pseudomonas aeruginosa NOT DETECTED NOT DETECTED Final  Stenotrophomonas maltophilia NOT DETECTED NOT DETECTED Final   Candida albicans NOT DETECTED NOT DETECTED Final   Candida auris NOT DETECTED NOT DETECTED Final   Candida glabrata NOT DETECTED NOT DETECTED Final   Candida krusei NOT DETECTED NOT DETECTED Final   Candida parapsilosis NOT DETECTED NOT DETECTED Final   Candida tropicalis NOT DETECTED NOT DETECTED Final   Cryptococcus neoformans/gattii NOT DETECTED NOT DETECTED Final   Methicillin resistance mecA/C DETECTED (A) NOT DETECTED Final    Comment: CRITICAL RESULT CALLED TO, READ BACK BY AND VERIFIED WITH: [facility] MITCHELLE 03/18/22 0719 MW Performed at [facility], [address] [place], [place] [address]     Radiology Studies:  CT Angio Chest PE W and/or Wo Contrast  Result Date: 03/17/2022 CLINICAL DATA:  Left-sided chest pain. Pain when taking a deep breath. EXAM: CT ANGIOGRAPHY CHEST WITH CONTRAST TECHNIQUE: Multidetector CT imaging of the chest was performed using the standard protocol during bolus administration of intravenous contrast. Multiplanar CT image reconstructions and MIPs were obtained to evaluate the vascular anatomy. RADIATION DOSE REDUCTION: This exam was performed according to the departmental dose-optimization program which includes automated exposure  control, adjustment of the mA and/or kV according to patient size and/or use of iterative reconstruction technique. CONTRAST:  81mL OMNIPAQUE IOHEXOL 350 MG/ML SOLN COMPARISON:  Two-view chest x-ray 03/17/2022. CT angio chest 02/07/2021 FINDINGS: Cardiovascular: The heart size is normal. Aorta and great vessel origins are within normal limits. Pulmonary artery opacification is satisfactory. Focal filling defect is present at the branch point of the left intralobar artery into the basilar segmental branches, best seen on image 171 of series 6. Contrast opacification over the inferior lungs is suboptimal. Filling defect is suspected within the medial basilar segment on the right is concerning for additional small emboli. RV:LV ratio is within normal limits. Mediastinum/Nodes: Subcentimeter paratracheal nodes are present. No significant mediastinal, hilar nodes are present. A left axillary node measures up to 12 mm in short access. Other smaller axillary nodes are present bilaterally. Lungs/Pleura: Airspace opacities are present at the lung bases, left greater than right. A small left effusion is present. No nodule or mass lesion is present. No pneumothorax is present. Upper Abdomen: Visualized upper abdomen is unremarkable. Musculoskeletal: No chest wall abnormality. No acute or significant osseous findings. Review of the MIP images confirms the above findings. IMPRESSION: 1. Focal filling defect at the branch point of the left intralobar artery into the basilar segmental branches compatible with a pulmonary embolus. 2. Filling defect is suspected within the medial basilar segment on the right. 3. Airspace opacities at the lung bases, left greater than right likely related to the pulmonary emboli. Infection is considered less likely. 4. Small left effusion. 5. Enlarged left axillary node measuring up to 12 mm in short access. Other smaller axillary nodes are present bilaterally. These are likely reactive.  Electronically Signed   By: Marin Roberts M.D.   On: 03/17/2022 15:13   DG Chest 2 View  Result Date: 03/17/2022 CLINICAL DATA:  37 year old female with chest pain and shortness of breath. History of endocarditis. EXAM: CHEST - 2 VIEW COMPARISON:  Chest radiographs 06/04/2021 and earlier. FINDINGS: Mildly lower lung volumes. Mediastinal contours remain normal. Visualized tracheal air column is within normal limits. Lung markings are stable, within normal limits. No pneumothorax or pleural effusion. No acute pulmonary opacity. No acute osseous abnormality identified. Negative visible bowel gas pattern. IMPRESSION: Lower lung volumes.  No acute cardiopulmonary abnormality. Electronically Signed   By: Rexene Edison  [place] Crane M.D.   On: 03/17/2022 10:21    Scheduled Meds:    methadone  110 mg Oral Daily   QUEtiapine  100 mg Oral QHS    Continuous Infusions:    heparin 1,550 Units/hr (03/18/22 0033)     LOS: 1 day     Vernell Leep, MD,  FACP, FHM, [facility], [facility], [place]     To contact the attending provider between 7A-7P or the covering provider during after hours 7P-7A, please log into the web site [email or web address] and access using universal [place] password for that web site. If you do not have the password, please call the hospital operator.  03/18/2022, 8:20 AM

## 2022-03-18 NOTE — Consult Note (Signed)
NAME: Karen Clarke  DOB: May 16, 1985  MRN: [id number]  Date/Time: 03/18/2022 11:06 AM  REQUESTING PROVIDER: Dr. Waymon Amato Subjective:  REASON FOR CONSULT: bacteremia ? Karen Clarke is a 37 y.o. 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 Pt says 10-14  days  ago she hurt her left leg on a bed board- The wound which is chronic for which she is followed at [facility], started to bleed, and then turned greenish black   The left hip started to hurt and then the rt collar bone and the left shoulder and left side of chest. The past 2 days it was just the left side of chest especially with breathing or coughing- She came to the ED because of that- Her last cocaine use was 5 days ago - IV - she used the back of the both forearms Vitals in the ED  03/17/22  BP 114/59 !  Temp 98.3 F (36.8 C)  Pulse Rate 106 !  Resp 22 !  SpO2 98 %  Weight 215 lb     Latest Reference Range & Units 03/17/22  WBC 4.0 - 10.5 K/uL 15.4 (H)  Hemoglobin 12.0 - 15.0 g/dL 8.6 (L) [3]  HCT 53.6 - 46.0 % 28.0 (L)  Platelets 150 - 400 K/uL 531 (H)  Creatinine 0.44 - 1.00 mg/dL 1.44   CTA showed focal filling defect of both lungs basilar artery segments consistent with PE- she was started on heparin. Blood culture sent  She was also started on broad spectrum antibiotics I am seeing her as blood culture 1 of 4 coag neg staph She has had some chills and sweats   Past Medical History:  Diagnosis Date   Endocarditis    IgA deficiency (HCC)    Lupus (HCC)     Past Surgical History:   Procedure Laterality Date   TEE WITHOUT CARDIOVERSION N/A 02/09/2021   Procedure: TRANSESOPHAGEAL ECHOCARDIOGRAM (TEE);  Surgeon: Antonieta Iba, MD;  Location: [facility] ORS;  Service: Cardiovascular;  Laterality: N/A;   TONSILLECTOMY      Social History   Socioeconomic History   Marital status: Single    Spouse name: Not on file   Number of children: Not on file   Years of education: Not on file   Highest education level: Not on file  Occupational History   Not on file  Tobacco Use   Smoking status: Every Day    Types: Cigarettes   Smokeless tobacco: Never  Substance and Sexual Activity   Alcohol use: Never   Drug use: Yes    Types: IV, Cocaine   Sexual activity: Yes  Other Topics Concern   Not on file  Social History Narrative   Not on file   Social Determinants of Health   Financial Resource Strain: Not on file  Food Insecurity: No Food Insecurity (03/18/2022)   Hunger Vital Sign    Worried About Running Out of Food in the Last Year: Never true    Ran Out of Food in the Last Year: Never true  Transportation Needs: No Transportation Needs (03/18/2022)   PRAPARE -  Hydrologist (Medical): No    Lack of Transportation (Non-Medical): No  Physical Activity: Not on file  Stress: Not on file  Social Connections: Not on file  Intimate Partner Violence: Not At Risk (03/18/2022)   Humiliation, Afraid, Rape, and Kick questionnaire    Fear of Current or Ex-Partner: No    Emotionally Abused: No    Physically Abused: No    Sexually Abused: No    History reviewed. No pertinent family history. Allergies  Allergen Reactions   Capsaicin Hives and Itching   Cefazolin Rash    Tolerated cefepime 02/20/20 Negative penicillin skin test 12/27/2020   ? Current Facility-Administered Medications  Medication Dose Route Frequency Provider Last Rate Last Admin   acetaminophen (TYLENOL) tablet 650 mg  650 mg Oral Q6H PRN Cox, Amy N, DO   650 mg at 03/17/22  1546   Or   acetaminophen (TYLENOL) suppository 650 mg  650 mg Rectal Q6H PRN Cox, Amy N, DO       apixaban (ELIQUIS) tablet 10 mg  10 mg Oral BID Vira Blanco, RPH   10 mg at 03/18/22 1009   Followed by   Derrill Memo ON 03/25/2022] apixaban (ELIQUIS) tablet 5 mg  5 mg Oral BID Vira Blanco, RPH       ketorolac (TORADOL) 15 MG/ML injection 15 mg  15 mg Intravenous Q6H PRN Mansy, Jan A, MD   15 mg at 03/18/22 0014   methadone (DOLOPHINE) 10 MG/ML solution 110 mg  110 mg Oral Daily Cox, Amy N, DO   110 mg at 03/18/22 0455   morphine (PF) 2 MG/ML injection 2 mg  2 mg Intravenous Q4H PRN Mansy, Jan A, MD   2 mg at 03/18/22 1019   nicotine (NICODERM CQ - dosed in mg/24 hours) patch 21 mg  21 mg Transdermal Daily PRN Cox, Amy N, DO       ondansetron (ZOFRAN) tablet 4 mg  4 mg Oral Q6H PRN Cox, Amy N, DO       Or   ondansetron (ZOFRAN) injection 4 mg  4 mg Intravenous Q6H PRN Cox, Amy N, DO   4 mg at 03/17/22 1547   QUEtiapine (SEROQUEL) tablet 100 mg  100 mg Oral QHS Cox, Amy N, DO   100 mg at 03/17/22 2124   senna-docusate (Senokot-S) tablet 1 tablet  1 tablet Oral QHS PRN Cox, Amy N, DO       vancomycin (VANCOCIN) IVPB 1000 mg/200 mL premix  1,000 mg Intravenous Q12H Berton Mount, RPH       Current Outpatient Medications  Medication Sig Dispense Refill   methadone (DOLOPHINE) 10 MG/ML solution Take 110 mg by mouth daily.     QUEtiapine (SEROQUEL) 100 MG tablet Take 1 tablet (100 mg total) by mouth at bedtime. 20 tablet 0   acetaminophen (TYLENOL) 325 MG tablet Take 2 tablets (650 mg total) by mouth every 6 (six) hours as needed for mild pain (or Fever >/= 101).     ibuprofen (ADVIL) 400 MG tablet Take 2 tablets (800 mg total) by mouth every 8 (eight) hours as needed for moderate pain.     QUEtiapine (SEROQUEL) 100 MG tablet Take 1 tablet (100 mg total) by mouth at bedtime for 7 days. 7 tablet 0     Abtx:  Anti-infectives (From admission, onward)    Start     Dose/Rate Route Frequency  Ordered Stop   03/18/22 1800  vancomycin (VANCOCIN) IVPB  1000 mg/200 mL premix        1,000 mg 200 mL/hr over 60 Minutes Intravenous Every 12 hours 03/18/22 0856     03/18/22 0500  vancomycin (VANCOREADY) IVPB 750 mg/150 mL  Status:  Discontinued       See Hyperspace for full Linked Orders Report.   750 mg 150 mL/hr over 60 Minutes Intravenous Every 12 hours 03/17/22 1554 03/18/22 0817   03/17/22 1700  vancomycin (VANCOREADY) IVPB 2000 mg/400 mL       See Hyperspace for full Linked Orders Report.   2,000 mg 200 mL/hr over 120 Minutes Intravenous  Once 03/17/22 1554 03/17/22 2027   03/17/22 1600  ceFEPIme (MAXIPIME) 2 g in sodium chloride 0.9 % 100 mL IVPB  Status:  Discontinued        2 g 200 mL/hr over 30 Minutes Intravenous Every 8 hours 03/17/22 1550 03/18/22 0817   03/17/22 1530  levofloxacin (LEVAQUIN) IVPB 750 mg  Status:  Discontinued        750 mg 100 mL/hr over 90 Minutes Intravenous  Once 03/17/22 1518 03/17/22 1525   03/17/22 1530  cefTRIAXone (ROCEPHIN) 2 g in sodium chloride 0.9 % 100 mL IVPB  Status:  Discontinued        2 g 200 mL/hr over 30 Minutes Intravenous  Once 03/17/22 1525 03/17/22 1536       REVIEW OF SYSTEMS:  Const: negative fever, ++ chills, negative weight loss Eyes: negative diplopia or visual changes, negative eye pain ENT: negative coryza, negative sore throat Resp: + cough, + dyspnea + sputum Cards: + chest pain, no palpitations, lower extremity edema GU: negative for frequency, dysuria and hematuria GI: had nausea and vomiting after coming to hospital Skin: skin lesions Heme: negative for easy bruising and gum/nose bleeding MS: general weakness Neurolo:negative for headaches, dizziness, vertigo, memory problems  Psych:  anxiety, depression  Endocrine: negative for thyroid, diabetes Allergy/Immunology- cefazolin rash Objective:  VITALS:  BP 123/68   Pulse 93   Temp 98.3 F (36.8 C) (Oral)   Resp 14   Ht 5\' 3"  (1.6 m)   Wt 97.5 kg   SpO2  95%   BMI 38.09 kg/m   PHYSICAL EXAM:  General: Alert, cooperative, no distress, appears stated age. Chronically ill Head: Normocephalic, without obvious abnormality, atraumatic. Eyes: Conjunctivae clear, anicteric sclerae. Pupils are equal ENT Nares normal. No drainage or sinus tenderness. Lips, mucosa, and tongue normal. No Thrush Neck: Supple, symmetrical, no adenopathy, thyroid: non tender no carotid bruit and no JVD. Back: No CVA tenderness. Lungs: Clear to auscultation bilaterally. No Wheezing or Rhonchi. No rales. Heart: Regular rate and rhythm, no murmur, rub or gallop. Abdomen: Soft, non-tender,not distended. Bowel sounds normal. No masses Extremities: atraumatic, no cyanosis. No edema. No clubbing Skin: b/l forearm- post aspect- inflamed areas with muliple small wounds  Left shoin wound- patient did not want me to examine the wound or culture today Lymph: Cervical, supraclavicular normal. Neurologic: Grossly non-focal Pertinent Labs Lab Results CBC    Component Value Date/Time   WBC 11.6 (H) 03/18/2022 0511   RBC 3.31 (L) 03/18/2022 0511   HGB 7.5 (L) 03/18/2022 0511   HCT 26.1 (L) 03/18/2022 0511   PLT 454 (H) 03/18/2022 0511   MCV 78.9 (L) 03/18/2022 0511   MCH 22.7 (L) 03/18/2022 0511   MCHC 28.7 (L) 03/18/2022 0511   RDW 17.2 (H) 03/18/2022 0511   LYMPHSABS 2.6 03/17/2022 1030   MONOABS 1.2 (H) 03/17/2022 1030   EOSABS 0.2  03/17/2022 1030   BASOSABS 0.0 03/17/2022 1030       Latest Ref Rng & Units 03/18/2022    5:11 AM 03/17/2022   10:30 AM 06/04/2021    9:38 AM  CMP  Glucose 70 - 99 mg/dL 585  929  244   BUN 6 - 20 mg/dL 14  17  17    Creatinine 0.44 - 1.00 mg/dL  6.28  6.38   Sodium 135 - 145 mmol/L 138  136  137   Potassium 3.5 - 5.1 mmol/L 3.5  4.0  3.6   Chloride 98 - 111 mmol/L 110  109  103   CO2 22 - 32 mmol/L 19  19  27    Calcium 8.9 - 10.3 mg/dL 7.9  8.6  8.3   Total Protein 6.5 - 8.1 g/dL 7.1   7.0   Total Bilirubin 0.3 - 1.2 mg/dL 0.3    0.3   Alkaline Phos 38 - 126 U/L 179   162   AST 15 - 41 U/L 24   25   ALT 0 - 44 U/L 34   28       Microbiology: Recent Results (from the past 240 hour(s))  Blood culture (routine x 2)     Status: None (Preliminary result)   Collection Time: 03/17/22 10:30 AM   Specimen: BLOOD  Result Value Ref Range Status   Specimen Description BLOOD BLOOD RIGHT FOREARM  Final   Special Requests   Final    BOTTLES DRAWN AEROBIC AND ANAEROBIC Blood Culture adequate volume   Culture  Setup Time   Final    GRAM POSITIVE COCCI Organism ID to follow AEROBIC BOTTLE ONLY CRITICAL RESULT CALLED TO, READ BACK BY AND VERIFIED WITH: [facility] MITCHELLE 03/18/22 0719 MW Performed at [facility], [address] [place], [address] [place]    Culture GRAM POSITIVE COCCI  Final   Report Status PENDING  Incomplete  Blood culture (routine x 2)     Status: None (Preliminary result)   Collection Time: 03/17/22 10:30 AM   Specimen: BLOOD  Result Value Ref Range Status   Specimen Description BLOOD BLOOD RIGHT ARM  Final   Special Requests   Final    BOTTLES DRAWN AEROBIC AND ANAEROBIC Blood Culture adequate volume   Culture   Final    NO GROWTH < 24 HOURS Performed at [facility], [address] [place], [address] [place]    Report Status PENDING  Incomplete  Resp panel by RT-PCR (RSV, Flu A&B, Covid) Anterior Nasal Swab     Status: None   Collection Time: 03/17/22 10:30 AM   Specimen: Anterior Nasal Swab  Result Value Ref Range Status   SARS Coronavirus 2 by RT PCR NEGATIVE NEGATIVE Final    Comment: (NOTE) SARS-CoV-2 target nucleic acids are NOT DETECTED.  The SARS-CoV-2 RNA is generally detectable in upper respiratory specimens during the acute phase of infection. The lowest concentration of SARS-CoV-2 viral copies this assay can detect is 138 copies/mL. A negative result does not preclude SARS-Cov-2 infection and should not be used as the sole basis for treatment or other  patient management decisions. A negative result may occur with  improper specimen collection/handling, submission of specimen other than nasopharyngeal swab, presence of viral mutation(s) within the areas targeted by this assay, and inadequate number of viral copies(<138 copies/mL). A negative result must be combined with clinical observations, patient history, and epidemiological information. The expected result is Negative.  Fact Sheet for Patients:  EntrepreneurPulse.com.au  Fact Sheet for Healthcare Providers:  IncredibleEmployment.be  This test is no t yet approved or cleared by the [place] FDA and  has been authorized for detection and/or diagnosis of SARS-CoV-2 by FDA under an Emergency Use Authorization (EUA). This EUA will remain  in effect (meaning this test can be used) for the duration of the COVID-19 declaration under Section 564(b)(1) of the Act, 21 U.S.C.section 360bbb-3(b)(1), unless the authorization is terminated  or revoked sooner.       Influenza A by PCR NEGATIVE NEGATIVE Final   Influenza B by PCR NEGATIVE NEGATIVE Final    Comment: (NOTE) The Xpert Xpress SARS-CoV-2/FLU/RSV plus assay is intended as an aid in the diagnosis of influenza from Nasopharyngeal swab specimens and should not be used as a sole basis for treatment. Nasal washings and aspirates are unacceptable for Xpert Xpress SARS-CoV-2/FLU/RSV testing.  Fact Sheet for Patients: EntrepreneurPulse.com.au  Fact Sheet for Healthcare Providers: IncredibleEmployment.be  This test is not yet approved or cleared by the [place] FDA and has been authorized for detection and/or diagnosis of SARS-CoV-2 by FDA under an Emergency Use Authorization (EUA). This EUA will remain in effect (meaning this test can be used) for the duration of the COVID-19 declaration under Section 564(b)(1) of the Act, 21 U.S.C. section  360bbb-3(b)(1), unless the authorization is terminated or revoked.     Resp Syncytial Virus by PCR NEGATIVE NEGATIVE Final    Comment: (NOTE) Fact Sheet for Patients: EntrepreneurPulse.com.au  Fact Sheet for Healthcare Providers: IncredibleEmployment.be  This test is not yet approved or cleared by the [place] FDA and has been authorized for detection and/or diagnosis of SARS-CoV-2 by FDA under an Emergency Use Authorization (EUA). This EUA will remain in effect (meaning this test can be used) for the duration of the COVID-19 declaration under Section 564(b)(1) of the Act, 21 U.S.C. section 360bbb-3(b)(1), unless the authorization is terminated or revoked.  Performed at [facility], [address] [place], [place] [address]   Blood Culture ID Panel (Reflexed)     Status: Abnormal   Collection Time: 03/17/22 10:30 AM  Result Value Ref Range Status   Enterococcus faecalis NOT DETECTED NOT DETECTED Final   Enterococcus Faecium NOT DETECTED NOT DETECTED Final   Listeria monocytogenes NOT DETECTED NOT DETECTED Final   Staphylococcus species DETECTED (A) NOT DETECTED Final    Comment: CRITICAL RESULT CALLED TO, READ BACK BY AND VERIFIED WITH: DEVAN MITCHELLE 03/18/22 0719 MW    Staphylococcus aureus (BCID) NOT DETECTED NOT DETECTED Final   Staphylococcus epidermidis DETECTED (A) NOT DETECTED Final    Comment: Methicillin (oxacillin) resistant coagulase negative staphylococcus. Possible blood culture contaminant (unless isolated from more than one blood culture draw or clinical case suggests pathogenicity). No antibiotic treatment is indicated for blood  culture contaminants. CRITICAL RESULT CALLED TO, READ BACK BY AND VERIFIED WITH: DEVAN MITCHELLE 03/18/22 0719 MW    Staphylococcus lugdunensis NOT DETECTED NOT DETECTED Final   Streptococcus species NOT DETECTED NOT DETECTED Final   Streptococcus agalactiae NOT DETECTED NOT  DETECTED Final   Streptococcus pneumoniae NOT DETECTED NOT DETECTED Final   Streptococcus pyogenes NOT DETECTED NOT DETECTED Final   A.calcoaceticus-baumannii NOT DETECTED NOT DETECTED Final   Bacteroides fragilis NOT DETECTED NOT DETECTED Final   Enterobacterales NOT DETECTED NOT DETECTED Final   Enterobacter cloacae complex NOT DETECTED NOT DETECTED Final   Escherichia coli NOT DETECTED NOT DETECTED Final   Klebsiella aerogenes NOT DETECTED NOT DETECTED Final   Klebsiella oxytoca  NOT DETECTED NOT DETECTED Final   Klebsiella pneumoniae NOT DETECTED NOT DETECTED Final   Proteus species NOT DETECTED NOT DETECTED Final   Salmonella species NOT DETECTED NOT DETECTED Final   Serratia marcescens NOT DETECTED NOT DETECTED Final   Haemophilus influenzae NOT DETECTED NOT DETECTED Final   Neisseria meningitidis NOT DETECTED NOT DETECTED Final   Pseudomonas aeruginosa NOT DETECTED NOT DETECTED Final   Stenotrophomonas maltophilia NOT DETECTED NOT DETECTED Final   Candida albicans NOT DETECTED NOT DETECTED Final   Candida auris NOT DETECTED NOT DETECTED Final   Candida glabrata NOT DETECTED NOT DETECTED Final   Candida krusei NOT DETECTED NOT DETECTED Final   Candida parapsilosis NOT DETECTED NOT DETECTED Final   Candida tropicalis NOT DETECTED NOT DETECTED Final   Cryptococcus neoformans/gattii NOT DETECTED NOT DETECTED Final   Methicillin resistance mecA/C DETECTED (A) NOT DETECTED Final    Comment: CRITICAL RESULT CALLED TO, READ BACK BY AND VERIFIED WITH: [facility] MITCHELLE 03/18/22 0719 MW Performed at [facility], [address] [place], [place] [address]     IMAGING RESULTS: CTA- showed focal filling defect of both lungs basilar artery segments consistent with PE  I have personally reviewed the films ? Impression/Recommendation ?37 y.o. 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 She also has chronic anterior shin ulceration,  Lupus Presents with chest pain, sob and cough  Acute PE b/l- on heparin  Because of IVDA concern for infection/endocarditis BC 1/4 staph epidermidis- very likely skin contamination rather than a true pathogen On vanco Will check 2 d echo  B/l wounds fore arms, left shin Will need culture Add cefepime to the vancomycin  IVDA- cocaine  H/o multiple bacteremias, endocarditis ?   ___________________________________________________ Discussed with patient, requesting provider

## 2022-03-18 NOTE — Consult Note (Addendum)
[facility] Nurse Consult Note: Reason for Consult: Consult requested for left leg.  Pt states she has gone to the outpatient wound center at [facility] in the past and wound was improving until recently; when she hit it against the bed and it declined with brown outer wound bed, increased tan drainage, and odor during the past week.  Pt is very knowledgeable regarding wound care and several other previous wounds have healed and are intact scar tissue. 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  It is difficult to promote healing for this complex medical condition and it is very painful.  She was previously using Vaseline gauze, but now needs a dressing which will absorb drainage.  She is on systemic antibiotics.   Wound type: Left anterior calf with full thickness wound; 11X8X.2cm, 80% red, 20% yellow, large amt green drainage and strong odor.  Dressing procedure/placement/frequency: Topical treatment orders provided for bedside nurses to perform as follows to provide antimicrobial benifits: Apply Aquacel to left leg wound Q day Kellie Simmering # [phone]) and cover with ABD pad and kerlex.  Moisten with NS each time to remove dressing.   Pt could benefit from follow-up at the outpatient wound care center; this must be by physician referral.  Please order if desired upon discharge.  Please re-consult if further assistance is needed.  Thank-you,  Julien Girt MSN, [place], [place], [place], [place]

## 2022-03-19 DIAGNOSIS — E876 Hypokalemia: Secondary | ICD-10-CM

## 2022-03-19 DIAGNOSIS — I2699 Other pulmonary embolism without acute cor pulmonale: Secondary | ICD-10-CM | POA: Diagnosis not present

## 2022-03-19 LAB — CBC
HCT: 23.1 % — ABNORMAL LOW (ref 36.0–46.0)
HCT: 24 % — ABNORMAL LOW (ref 36.0–46.0)
Hemoglobin: 7 g/dL — ABNORMAL LOW (ref 12.0–15.0)
Hemoglobin: 7.4 g/dL — ABNORMAL LOW (ref 12.0–15.0)
MCH: 22.9 pg — ABNORMAL LOW (ref 26.0–34.0)
MCH: 23.2 pg — ABNORMAL LOW (ref 26.0–34.0)
MCHC: 30.3 g/dL (ref 30.0–36.0)
MCHC: 30.8 g/dL (ref 30.0–36.0)
MCV: 75.5 fL — ABNORMAL LOW (ref 80.0–100.0)
MCV: 75.2 fL — ABNORMAL LOW (ref 80.0–100.0)
Platelets: 439 10*3/uL — ABNORMAL HIGH (ref 150–400)
Platelets: 515 10*3/uL — ABNORMAL HIGH (ref 150–400)
RBC: 3.06 MIL/uL — ABNORMAL LOW (ref 3.87–5.11)
RBC: 3.19 MIL/uL — ABNORMAL LOW (ref 3.87–5.11)
RDW: 17.4 % — ABNORMAL HIGH (ref 11.5–15.5)
RDW: 17.2 % — ABNORMAL HIGH (ref 11.5–15.5)
WBC: 10.9 10*3/uL — ABNORMAL HIGH (ref 4.0–10.5)
WBC: 9.2 10*3/uL (ref 4.0–10.5)
nRBC: 0 % (ref 0.0–0.2)
nRBC: 0 % (ref 0.0–0.2)

## 2022-03-19 LAB — BASIC METABOLIC PANEL
Anion gap: 7 (ref 5–15)
BUN: 10 mg/dL (ref 6–20)
CO2: 21 mmol/L — ABNORMAL LOW (ref 22–32)
Calcium: 8 mg/dL — ABNORMAL LOW (ref 8.9–10.3)
Chloride: 108 mmol/L (ref 98–111)
Creatinine, Ser: 0.69 mg/dL (ref 0.44–1.00)
GFR, Estimated: >60 mL/min (ref >60)
Glucose, Bld: 109 mg/dL — ABNORMAL HIGH (ref 70–99)
Potassium: 3.2 mmol/L — ABNORMAL LOW (ref 3.5–5.1)
Sodium: 136 mmol/L (ref 135–145)

## 2022-03-19 LAB — GLUCOSE, CAPILLARY
Glucose-Capillary: 89 mg/dL (ref 70–99)
Glucose-Capillary: 123 mg/dL — ABNORMAL HIGH (ref 70–99)

## 2022-03-19 LAB — MAGNESIUM: Magnesium: 2.4 mg/dL (ref 1.7–2.4)

## 2022-03-19 LAB — IRON AND TIBC
Iron: 13 ug/dL — ABNORMAL LOW (ref 28–170)
Saturation Ratios: 3 % — ABNORMAL LOW (ref 10.4–31.8)
TIBC: 389 ug/dL (ref 250–450)
UIBC: 376 ug/dL

## 2022-03-19 LAB — VITAMIN B12: Vitamin B-12: 421 pg/mL (ref 180–914)

## 2022-03-19 LAB — RETICULOCYTES
Immature Retic Fract: 13.9 % (ref 2.3–15.9)
RBC.: 3.37 MIL/uL — ABNORMAL LOW (ref 3.87–5.11)
Retic Count, Absolute: 51.6 10*3/uL (ref 19.0–186.0)
Retic Ct Pct: 1.5 % (ref 0.4–3.1)

## 2022-03-19 LAB — APTT: aPTT: 91 seconds — ABNORMAL HIGH (ref 24–36)

## 2022-03-19 LAB — HEPATITIS C ANTIBODY: HCV Ab: REACTIVE — AB

## 2022-03-19 LAB — FERRITIN: Ferritin: 32 ng/mL (ref 11–307)

## 2022-03-19 LAB — HEPARIN LEVEL (UNFRACTIONATED): Heparin Unfractionated: >1.1 IU/mL — ABNORMAL HIGH (ref 0.30–0.70)

## 2022-03-19 LAB — ABO/RH: ABO/RH(D): O POS

## 2022-03-19 LAB — FOLATE: Folate: 20.6 ng/mL (ref >5.9)

## 2022-03-19 MED ORDER — HEPARIN SOD (PORK) LOCK FLUSH 100 UNIT/ML IV SOLN: 500.0000 [IU] | Freq: Every day | INTRAVENOUS | Status: DC | PRN

## 2022-03-19 MED ORDER — SODIUM CHLORIDE 0.9 % IV SOLN
200.0000 mg | INTRAVENOUS | Status: DC
  Administered 2022-03-19: 200 mg via INTRAVENOUS
  Filled 2022-03-19: qty 200
  Filled 2022-03-19: qty 10

## 2022-03-19 MED ORDER — SODIUM CHLORIDE 0.9% FLUSH: 3.0000 mL | INTRAVENOUS | Status: DC | PRN

## 2022-03-19 MED ORDER — HEPARIN SOD (PORK) LOCK FLUSH 100 UNIT/ML IV SOLN: 250.0000 [IU] | INTRAVENOUS | Status: DC | PRN

## 2022-03-19 MED ORDER — HEPARIN (PORCINE) 25000 UT/250ML-% IV SOLN
1400.0000 [IU]/h | INTRAVENOUS | Status: DC
  Administered 2022-03-19: 1250 [IU]/h via INTRAVENOUS
  Filled 2022-03-19: qty 250

## 2022-03-19 MED ORDER — EPOETIN ALFA 10000 UNIT/ML IJ SOLN: 20000.0000 [IU] | INTRAMUSCULAR | Status: DC

## 2022-03-19 MED ORDER — POTASSIUM CHLORIDE CRYS ER 20 MEQ PO TBCR
40.0000 meq | EXTENDED_RELEASE_TABLET | ORAL | Status: AC
  Administered 2022-03-19 (×2): 40 meq via ORAL
  Filled 2022-03-19 (×2): qty 2

## 2022-03-19 MED ORDER — HEPARIN BOLUS VIA INFUSION
2500.0000 [IU] | Freq: Once | INTRAVENOUS | Status: AC
  Administered 2022-03-19: 2500 [IU] via INTRAVENOUS
  Filled 2022-03-19: qty 2500

## 2022-03-19 MED ORDER — DIPHENHYDRAMINE HCL 50 MG/ML IJ SOLN
25.0000 mg | Freq: Once | INTRAMUSCULAR | Status: AC
  Administered 2022-03-20: 25 mg via INTRAVENOUS
  Filled 2022-03-19: qty 1

## 2022-03-19 MED ORDER — SODIUM CHLORIDE 0.9% IV SOLUTION: 250.0000 mL | Freq: Once | INTRAVENOUS | Status: DC

## 2022-03-19 MED ORDER — SODIUM CHLORIDE 0.9% FLUSH: 10.0000 mL | INTRAVENOUS | Status: DC | PRN

## 2022-03-19 MED ORDER — SODIUM CHLORIDE 0.9 % IV SOLN: 200.0000 mg | INTRAVENOUS | Status: DC

## 2022-03-19 MED ORDER — ACETAMINOPHEN 325 MG PO TABS
650.0000 mg | ORAL_TABLET | Freq: Once | ORAL | Status: AC
  Administered 2022-03-20: 650 mg via ORAL
  Filled 2022-03-19: qty 2

## 2022-03-19 MED ORDER — METHYLPREDNISOLONE SODIUM SUCC 40 MG IJ SOLR
40.0000 mg | Freq: Once | INTRAMUSCULAR | Status: AC
  Administered 2022-03-20: 40 mg via INTRAVENOUS
  Filled 2022-03-19: qty 1

## 2022-03-19 NOTE — Progress Notes (Addendum)
PROGRESS NOTE   Karen Clarke  MVH:[id number]    DOB: [birth year]    DOA: 03/17/2022  PCP: Pcp, No   I have briefly reviewed patients previous medical records in [facility]  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  Admitted for bilateral PE noted on CTA chest, leukocytosis (working up for recurrent endocarditis).  Please see detailed discussion below regarding evaluation and management of her anemia and referred to hematology consultation note from 1/16.  Eliquis held and transitioning to IV heparin   Assessment & Plan:  Principal Problem:   Bilateral pulmonary embolism (HCC) Active Problems:   Hx of bacterial endocarditis   IVDU (intravenous drug user)   Positive hepatitis C antibody test   Polysubstance abuse (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  Filling defect within the medial basilar segment on the right.  Airspace opacities at lung bases, left greater than right also felt to be related to PE.  Patient reports having clots in the lungs before but never being on anticoagulation (?   Septic pulmonary emboli).  Has a copper T IUD and not on hormonal supplements.  No recent history of long distance travel.  Does have history of ongoing IVDA and lupus, both of which could be precipitating factors.  No mention of right heart strain on CT. transient sinus tachycardia resolved.  No hypoxia.  LEV Dopplers: Negative for DVT.  TTE 1/15: LVEF 70-75%, no right heart strain.  Briefly on IV heparin, transitioned to Eliquis 1/15, changed back to IV heparin (see detailed discussion below under anemia).  Compliance was extensively counseled.  Chest pain may be related to PE.  Leukocytosis/1 of 4 blood cultures positive for Staphylococcus epidermidis >suspecting contaminant but concern for recurrent endocarditis: Leukocytosis resolved.  Lactate normal.  Procalcitonin 0.35 > 0.29.  Influenza A, B and SARS coronavirus are 2 PCR negative.  UA not indicative of UTI.  Although her leukocytosis may be reactive versus due to chronic subacute cellulitis ongoing in bilateral forearms from ongoing IVDA, bilateral axillary lymphadenitis related to this.  BCID positive for Staphylococcus epidermidis.  1/4 blood culture positive for Staphylococcus epidermidis.  Rest negative to date.  Urine culture with insignificant growth.  TTE without mention of vegetations.  CRP 20.5 and ESR 107.  ID input much appreciated, continued IV vancomycin and cefepime was added.  Febrile to 101 F early this morning.  Follow temperature trend.  Possible cellulitis of bilateral dorsal forearm wounds (site of IVDA) and chronic left leg wound: Evaluation and antibiotic management as noted above.  ID on board.  Polysubstance abuse (IVDA, THC, tobacco): Cessation counseled.  Nicotine patch.  Patient has been counseled that opioids will be used  judiciously with out unnecessary escalation.  She reports that Toradol is actually working well.  Opioid use disorder on chronic methadone: Follows with [facility] 6 days a week for  her methadone treatment.  Outpatient follow-up with the clinic.  Continue home dose of methadone.  Chronic left leg wound: Patient reports that this is due to lupus related pyoderma gangrenosum.  Reportedly was healing until she bumped her leg onto her bed recently.  Examined and no clinical concern for acute infection.  WOC RN input 1/15 appreciated, recommends follow-up at outpatient wound care center, to be ordered at discharge by discharging provider.?  Outpatient follow-up with [facility] wound care clinic.  Patient has extensive induration bilateral dorsal forearm at site of IVDA.  SLE: Does not appear to be on any meds PTA.  Outpatient follow-up.  Subacute on chronic microcytic anemia: Baseline hemoglobin may be in the 8 g range.  Presented with hemoglobin of 8.6 which is dropped to 7.5, likely related to hemodilution.  No active bleeding reported but has ongoing menstrual cycle currently.  May have underlying anemia of chronic disease but other etiologies will need to be ruled out as outpatient.  Baseline hemoglobin likely between 8-9 g.  Hemoglobin has gradually dropped to 7 g per DL today.  Discussed PRBC transfusion with her, she indicates that she has been advised in the past that due to her IgA deficiency since birth, she should have blood transfusion which is "IgA filtered" otherwise she can have serious reactions.  She indicates that she has never had blood transfusions before.  Even while she was pregnant, they reportedly had blood that was "IgA filtered" ready and available as needed.  I have not dealt with such an issue before.  Thereby requested assistance from on-call hematologist.  Follow daily CBC.  As per communication with Dr. Alena Bills, Hematology, usually use washed PRBC products.  Dr. Alena Bills discussed with the blood bank who agreed.  They will have to order it from [place] so may be today or tomorrow.  She recommended adding iron panel/ferritin because CBC looks iron deficient and  recommended IV Venofer 200 Mg daily x 5 doses.  She will formally consult.  Addendum As per update from Dr. Alena Bills, she reviewed labs from care everywhere.  Patient was seeing rheumatology at [facility].  She has severe IgA deficiency.  Levels undetectable in 2021.  She indicates that due to this, even with washed RBC, there could be a risk for allergic reaction.  She recommends waiting until hemoglobin <6.  She will need heavy premedication before doing transfusion.  She recommends giving IV iron and Procrit for now, recheck hemoglobin tonight and she will order PRBC to keep on hold. Discussed just now with Dr. Alena Bills and she recommends transitioning to IV heparin temporarily (can be held if worsening bleeding or anemia) until we have PRBCs available (may be here by tomorrow), GYN to consult tomorrow and hopefully can start some medications to control her vaginal bleeding, following these measures, can transition back to Eliquis.  Body mass index is 38.09 kg/m./Obesity: Complicates overall care and prognosis.  Outpatient follow-up.  Failure to thrive: Unfortunate situation in this young patient, mostly due to polysubstance abuse.  Hypokalemia: Replace and follow.  Magnesium 2.4.   DVT prophylaxis: Place TED hose Start: 03/17/22 1533 Eliquis.   Code Status: Full Code:  Family Communication: None at bedside. Disposition:  Impression due to ongoing IV antibiotics, ongoing fevers, need for blood transfusion.  Not medically ready for DC.  Consultants:   Infectious disease  Procedures:     Antimicrobials:   IV vancomycin and cefepime 1/15 >   Subjective:  Fever of 101 F at 4 AM today.  Patient states that she felt "terrible" during the fever and even when I saw her late morning.  No other specific complaints.  No chest pain reported.  Ongoing menstrual bleeding, somewhat heavy.  Objective:   Vitals:   03/18/22 2034 03/19/22 0446 03/19/22 0637 03/19/22 0801  BP: 118/75 122/64   115/64  Pulse: (!) 106 (!) 111 (!) 102 94  Resp: 20 18  20   Temp: (!) 97.5 F (36.4 C) (!) 101 F (38.3 C) 98.1 F (36.7 C) 97.9 F (36.6 C)  TempSrc:  Oral Oral   SpO2: 98% 95%  94%  Weight:      Height:        General exam: Young female, moderately built and obese lying comfortably supine in bed without distress.  Did not appear septic or toxic. Respiratory system: Clear to auscultation.  No increased work of breathing. Cardiovascular system: S1 and S2 heard.  RRR.  No JVD, murmurs or pedal edema.  Telemetry personally reviewed: Sinus rhythm.  Telemetry discontinued. Gastrointestinal system: Abdomen is nondistended, soft and nontender. No organomegaly or masses felt. Normal bowel sounds heard. Central nervous system: Alert and oriented. No focal neurological deficits. Extremities: Symmetric 5 x 5 power. Skin: Large tattoo on anterior abdominal wall.  As examined on 1/15: Bilateral dorsal forearms with extensive induration likely from prior abscesses.  Has tiny pinhole lesions on the distal dorsal forearm on the left but no obvious drainage.  Large ulcer on left shin, dressing has been changed, clean and dry.   Psychiatry: Judgement and insight appear normal. Mood & affect appropriate.     Data Reviewed:   I have personally reviewed following labs and imaging studies   CBC: Recent Labs  Lab 03/17/22 1030 03/18/22 0511 03/19/22 0629  WBC 15.4* 11.6* 10.9*  NEUTROABS 11.3*  --   --   HGB 8.6* 7.5* 7.0*  HCT 28.0* 26.1* 23.1*  MCV 75.9* 78.9* 75.5*  PLT 531* 454* 439*    Basic Metabolic Panel: Recent Labs  Lab 03/17/22 1030 03/18/22 0511 03/19/22 0629  NA 136 138 136  K 4.0 3.5 3.2*  CL 109 110 108  CO2 19* 19* 21*  GLUCOSE 101* 110* 109*  BUN 17 14 10   CREATININE 0.81 0.75 0.69  CALCIUM 8.6* 7.9* 8.0*  MG  --   --  2.4    Liver Function Tests: Recent Labs  Lab 03/18/22 0511  AST 24  ALT 34  ALKPHOS 179*  BILITOT 0.3  PROT 7.1  ALBUMIN 3.1*     CBG: No results for input(s): "GLUCAP" in the last 168 hours.  Microbiology Studies:   Recent Results (from the past 240 hour(s))  Blood culture (routine x 2)     Status: Abnormal (Preliminary result)   Collection Time: 03/17/22 10:30 AM   Specimen: BLOOD  Result Value Ref Range Status   Specimen Description   Final    BLOOD BLOOD RIGHT FOREARM Performed at [facility], [address] [place], [address] [place]    Special Requests   Final    BOTTLES DRAWN AEROBIC AND ANAEROBIC Blood Culture adequate volume Performed at [facility], [address] [place], [address] [place]    Culture  Setup Time   Final    GRAM POSITIVE COCCI Organism ID to follow AEROBIC  BOTTLE ONLY CRITICAL RESULT CALLED TO, READ BACK BY AND VERIFIED WITH: [facility] MITCHELLE 03/18/22 0719 MW Performed at [facility], [address] [place], [place] [address]    Culture (A)  Final    STAPHYLOCOCCUS EPIDERMIDIS THE SIGNIFICANCE OF ISOLATING THIS ORGANISM FROM A SINGLE VENIPUNCTURE CANNOT BE PREDICTED WITHOUT FURTHER CLINICAL AND CULTURE CORRELATION. SUSCEPTIBILITIES AVAILABLE ONLY ON REQUEST. Performed at [facility], [address] [place], [place] [address]    Report Status PENDING  Incomplete  Blood culture (routine x 2)     Status: None (Preliminary result)   Collection Time: 03/17/22 10:30 AM   Specimen: BLOOD  Result Value Ref Range Status   Specimen Description BLOOD BLOOD RIGHT ARM  Final   Special Requests   Final    BOTTLES DRAWN AEROBIC AND ANAEROBIC Blood Culture adequate volume   Culture   Final    NO GROWTH 2 DAYS Performed at [facility], [address], [place], [place] [address]    Report Status PENDING  Incomplete  Resp panel by RT-PCR (RSV, Flu A&B, Covid) Anterior Nasal Swab     Status: None   Collection Time: 03/17/22 10:30 AM   Specimen: Anterior Nasal Swab  Result Value Ref Range Status   SARS Coronavirus 2 by RT PCR  NEGATIVE NEGATIVE Final    Comment: (NOTE) SARS-CoV-2 target nucleic acids are NOT DETECTED.  The SARS-CoV-2 RNA is generally detectable in upper respiratory specimens during the acute phase of infection. The lowest concentration of SARS-CoV-2 viral copies this assay can detect is 138 copies/mL. A negative result does not preclude SARS-Cov-2 infection and should not be used as the sole basis for treatment or other patient management decisions. A negative result may occur with  improper specimen collection/handling, submission of specimen other than nasopharyngeal swab, presence of viral mutation(s) within the areas targeted by this assay, and inadequate number of viral copies(<138 copies/mL). A negative result must be combined with clinical observations, patient history, and epidemiological information. The expected result is Negative.  Fact Sheet for Patients:  BloggerCourse.com  Fact Sheet for Healthcare Providers:  SeriousBroker.it  This test is no t yet approved or cleared by the [place] FDA and  has been authorized for detection and/or diagnosis of SARS-CoV-2 by FDA under an Emergency Use Authorization (EUA). This EUA will remain  in effect (meaning this test can be used) for the duration of the COVID-19 declaration under Section 564(b)(1) of the Act, 21 U.S.C.section 360bbb-3(b)(1), unless the authorization is terminated  or revoked sooner.       Influenza A by PCR NEGATIVE NEGATIVE Final   Influenza B by PCR NEGATIVE NEGATIVE Final    Comment: (NOTE) The Xpert Xpress SARS-CoV-2/FLU/RSV plus assay is intended as an aid in the diagnosis of influenza from Nasopharyngeal swab specimens and should not be used as a sole basis for treatment. Nasal washings and aspirates are unacceptable for Xpert Xpress SARS-CoV-2/FLU/RSV testing.  Fact Sheet for Patients: BloggerCourse.com  Fact Sheet for  Healthcare Providers: SeriousBroker.it  This test is not yet approved or cleared by the [place] FDA and has been authorized for detection and/or diagnosis of SARS-CoV-2 by FDA under an Emergency Use Authorization (EUA). This EUA will remain in effect (meaning this test can be used) for the duration of the COVID-19 declaration under Section 564(b)(1) of the Act, 21 U.S.C. section 360bbb-3(b)(1), unless the authorization is terminated or revoked.     Resp Syncytial Virus by PCR NEGATIVE NEGATIVE Final  Comment: (NOTE) Fact Sheet for Patients: BloggerCourse.comhttps://www.fda.gov/media/152166/download  Fact Sheet for Healthcare Providers: SeriousBroker.ithttps://www.fda.gov/media/152162/download  This test is not yet approved or cleared by the [place] FDA and has been authorized for detection and/or diagnosis of SARS-CoV-2 by FDA under an Emergency Use Authorization (EUA). This EUA will remain in effect (meaning this test can be used) for the duration of the COVID-19 declaration under Section 564(b)(1) of the Act, 21 U.S.C. section 360bbb-3(b)(1), unless the authorization is terminated or revoked.  Performed at [facility], [address] [place], [place] [address]   Blood Culture ID Panel (Reflexed)     Status: Abnormal   Collection Time: 03/17/22 10:30 AM  Result Value Ref Range Status   Enterococcus faecalis NOT DETECTED NOT DETECTED Final   Enterococcus Faecium NOT DETECTED NOT DETECTED Final   Listeria monocytogenes NOT DETECTED NOT DETECTED Final   Staphylococcus species DETECTED (A) NOT DETECTED Final    Comment: CRITICAL RESULT CALLED TO, READ BACK BY AND VERIFIED WITH: DEVAN MITCHELLE 03/18/22 0719 MW    Staphylococcus aureus (BCID) NOT DETECTED NOT DETECTED Final   Staphylococcus epidermidis DETECTED (A) NOT DETECTED Final    Comment: Methicillin (oxacillin) resistant coagulase negative staphylococcus. Possible blood culture contaminant (unless  isolated from more than one blood culture draw or clinical case suggests pathogenicity). No antibiotic treatment is indicated for blood  culture contaminants. CRITICAL RESULT CALLED TO, READ BACK BY AND VERIFIED WITH: DEVAN MITCHELLE 03/18/22 0719 MW    Staphylococcus lugdunensis NOT DETECTED NOT DETECTED Final   Streptococcus species NOT DETECTED NOT DETECTED Final   Streptococcus agalactiae NOT DETECTED NOT DETECTED Final   Streptococcus pneumoniae NOT DETECTED NOT DETECTED Final   Streptococcus pyogenes NOT DETECTED NOT DETECTED Final   A.calcoaceticus-baumannii NOT DETECTED NOT DETECTED Final   Bacteroides fragilis NOT DETECTED NOT DETECTED Final   Enterobacterales NOT DETECTED NOT DETECTED Final   Enterobacter cloacae complex NOT DETECTED NOT DETECTED Final   Escherichia coli NOT DETECTED NOT DETECTED Final   Klebsiella aerogenes NOT DETECTED NOT DETECTED Final   Klebsiella oxytoca NOT DETECTED NOT DETECTED Final   Klebsiella pneumoniae NOT DETECTED NOT DETECTED Final   Proteus species NOT DETECTED NOT DETECTED Final   Salmonella species NOT DETECTED NOT DETECTED Final   Serratia marcescens NOT DETECTED NOT DETECTED Final   Haemophilus influenzae NOT DETECTED NOT DETECTED Final   Neisseria meningitidis NOT DETECTED NOT DETECTED Final   Pseudomonas aeruginosa NOT DETECTED NOT DETECTED Final   Stenotrophomonas maltophilia NOT DETECTED NOT DETECTED Final   Candida albicans NOT DETECTED NOT DETECTED Final   Candida auris NOT DETECTED NOT DETECTED Final   Candida glabrata NOT DETECTED NOT DETECTED Final   Candida krusei NOT DETECTED NOT DETECTED Final   Candida parapsilosis NOT DETECTED NOT DETECTED Final   Candida tropicalis NOT DETECTED NOT DETECTED Final   Cryptococcus neoformans/gattii NOT DETECTED NOT DETECTED Final   Methicillin resistance mecA/C DETECTED (A) NOT DETECTED Final    Comment: CRITICAL RESULT CALLED TO, READ BACK BY AND VERIFIED WITH: [facility] MITCHELLE 03/18/22 0719  MW Performed at [facility], [address] [place], [place] [address]   Urine Culture     Status: Abnormal   Collection Time: 03/17/22  3:58 PM   Specimen: Urine, Clean Catch  Result Value Ref Range Status   Specimen Description   Final    URINE, CLEAN CATCH Performed at [facility], [address] [place], [place] [address]    Special Requests   Final  NONE Performed at [facility], [address], [place], [place] [address]    Culture (A)  Final    <10,000 COLONIES/mL INSIGNIFICANT GROWTH Performed at [facility], [address] [place], [place] [address]    Report Status 03/18/2022 FINAL  Final  MRSA Next Gen by PCR, Nasal     Status: Abnormal   Collection Time: 03/18/22 11:23 AM   Specimen: Nasal Mucosa; Nasal Swab  Result Value Ref Range Status   MRSA by PCR Next Gen DETECTED (A) NOT DETECTED Final    Comment: RESULT CALLED TO, READ BACK BY AND VERIFIED WITH: Karrie Meres RN [id number] 03/18/22 HNM Performed at [facility], [address] [place], [place] [address]     Radiology Studies:  ECHOCARDIOGRAM COMPLETE  Result Date: 03/18/2022    ECHOCARDIOGRAM REPORT   Patient Name:   Karen Clarke Date of Exam: 03/18/2022 Medical Rec #:  [id number]  Height:       63.0 in Accession #:    [id number] Weight:       215.0 lb Date of Birth:  02-19-1986  BSA:          1.994 m Patient Age:    36 years   BP:           106/56 mmHg Patient Gender: F          HR:           87 bpm. Exam Location:  [facility] Procedure: 2D Echo, Cardiac Doppler and Color Doppler Indications:     Pulmonary embolus  History:         Patient has prior history of Echocardiogram examinations, most                  recent 02/07/2021. Signs/Symptoms:Bacteremia; Risk                  Factors:Current Smoker. Substance abuse, IVDU.  Sonographer:     Mikki Harbor Referring Phys:  [id number] Theadora Rama D Tidus [place] Diagnosing Phys: Lorine Bears MD IMPRESSIONS  1. Left ventricular ejection  fraction, by estimation, is 70 to 75%. The left ventricle has hyperdynamic function. The left ventricle has no regional wall motion abnormalities. Left ventricular diastolic parameters were normal.  2. Right ventricular systolic function is normal. The right ventricular size is normal. Tricuspid regurgitation signal is inadequate for assessing PA pressure.  3. The mitral valve is normal in structure. No evidence of mitral valve regurgitation. No evidence of mitral stenosis.  4. The aortic valve is normal in structure. Aortic valve regurgitation is not visualized. No aortic stenosis is present.  5. The inferior vena cava is normal in size with greater than 50% respiratory variability, suggesting right atrial pressure of 3 mmHg. FINDINGS  Left Ventricle: Left ventricular ejection fraction, by estimation, is 70 to 75%. The left ventricle has hyperdynamic function. The left ventricle has no regional wall motion abnormalities. The left ventricular internal cavity size was normal in size. There is no left ventricular hypertrophy. Left ventricular diastolic parameters were normal. Right Ventricle: The right ventricular size is normal. No increase in right ventricular wall thickness. Right ventricular systolic function is normal. Tricuspid regurgitation signal is inadequate for assessing PA pressure. The tricuspid regurgitant velocity is 2.45 m/s, and with an assumed right atrial pressure of 3 mmHg, the estimated right ventricular systolic pressure is 27.0 mmHg. Left Atrium: Left atrial size was normal in size. Right Atrium: Right atrial size was normal in size. Pericardium: There is no  evidence of pericardial effusion. Mitral Valve: The mitral valve is normal in structure. No evidence of mitral valve regurgitation. No evidence of mitral valve stenosis. MV peak gradient, 5.6 mmHg. The mean mitral valve gradient is 2.0 mmHg. Tricuspid Valve: The tricuspid valve is normal in structure. Tricuspid valve regurgitation is  trivial. No evidence of tricuspid stenosis. Aortic Valve: The aortic valve is normal in structure. Aortic valve regurgitation is not visualized. No aortic stenosis is present. Aortic valve mean gradient measures 8.0 mmHg. Aortic valve peak gradient measures 16.3 mmHg. Aortic valve area, by VTI measures 2.40 cm. Pulmonic Valve: The pulmonic valve was normal in structure. Pulmonic valve regurgitation is not visualized. No evidence of pulmonic stenosis. Aorta: The aortic root is normal in size and structure. Venous: The inferior vena cava is normal in size with greater than 50% respiratory variability, suggesting right atrial pressure of 3 mmHg. IAS/Shunts: No atrial level shunt detected by color flow Doppler.  LEFT VENTRICLE PLAX 2D LVIDd:         4.80 cm   Diastology LVIDs:         2.70 cm   LV e' medial:    16.20 cm/s LV PW:         1.30 cm   LV E/e' medial:  7.6 LV IVS:        1.00 cm   LV e' lateral:   17.10 cm/s LVOT diam:     2.10 cm   LV E/e' lateral: 7.2 LV SV:         95 LV SV Index:   48 LVOT Area:     3.46 cm  RIGHT VENTRICLE RV Basal diam:  3.30 cm RV Mid diam:    3.50 cm RV S prime:     18.00 cm/s TAPSE (M-mode): 2.6 cm LEFT ATRIUM             Index        RIGHT ATRIUM           Index LA diam:        3.70 cm 1.86 cm/m   RA Area:     18.00 cm LA Vol (A2C):   54.4 ml 27.28 ml/m  RA Volume:   46.20 ml  23.17 ml/m LA Vol (A4C):   39.4 ml 19.76 ml/m LA Biplane Vol: 48.2 ml 24.17 ml/m  AORTIC VALVE                     PULMONIC VALVE AV Area (Vmax):    2.71 cm      PV Vmax:       1.00 m/s AV Area (Vmean):   2.43 cm      PV Peak grad:  4.0 mmHg AV Area (VTI):     2.40 cm AV Vmax:           202.00 cm/s AV Vmean:          127.000 cm/s AV VTI:            0.395 m AV Peak Grad:      16.3 mmHg AV Mean Grad:      8.0 mmHg LVOT Vmax:         158.00 cm/s LVOT Vmean:        89.000 cm/s LVOT VTI:          0.274 m LVOT/AV VTI ratio: 0.69  AORTA Ao Root diam: 3.60 cm MITRAL VALVE  TRICUSPID VALVE MV  Area (PHT): 4.52 cm     TR Peak grad:   24.0 mmHg MV Area VTI:   3.18 cm     TR Vmax:        245.00 cm/s MV Peak grad:  5.6 mmHg MV Mean grad:  2.0 mmHg     SHUNTS MV Vmax:       1.18 m/s     Systemic VTI:  0.27 m MV Vmean:      72.8 cm/s    Systemic Diam: 2.10 cm MV Decel Time: 168 msec MV E velocity: 123.00 cm/s MV A velocity: 78.00 cm/s MV E/A ratio:  1.58 Lorine Bears MD Electronically signed by Lorine Bears MD Signature Date/Time: 03/18/2022/1:16:45 PM    Final    US Venous Img Lower Bilateral (DVT)  Result Date: 03/18/2022 CLINICAL DATA:  Pulmonary embolism. EXAM: BILATERAL LOWER EXTREMITY VENOUS DOPPLER ULTRASOUND TECHNIQUE: Gray-scale sonography with graded compression, as well as color Doppler and duplex ultrasound were performed to evaluate the lower extremity deep venous systems from the level of the common femoral vein and including the common femoral, femoral, profunda femoral, popliteal and calf veins including the posterior tibial, peroneal and gastrocnemius veins when visible. The superficial great saphenous vein was also interrogated. Spectral Doppler was utilized to evaluate flow at rest and with distal augmentation maneuvers in the common femoral, femoral and popliteal veins. COMPARISON:  None Available. FINDINGS: RIGHT LOWER EXTREMITY Common Femoral Vein: No evidence of thrombus. Normal compressibility, respiratory phasicity and response to augmentation. Saphenofemoral Junction: No evidence of thrombus. Normal compressibility and flow on color Doppler imaging. Profunda Femoral Vein: No evidence of thrombus. Normal compressibility and flow on color Doppler imaging. Femoral Vein: No evidence of thrombus. Normal compressibility, respiratory phasicity and response to augmentation. Popliteal Vein: No evidence of thrombus. Normal compressibility, respiratory phasicity and response to augmentation. Calf Veins: No evidence of thrombus. Normal compressibility and flow on color Doppler imaging.  Superficial Great Saphenous Vein: No evidence of thrombus. Normal compressibility. Venous Reflux:  None. Other Findings:  None. LEFT LOWER EXTREMITY Common Femoral Vein: No evidence of thrombus. Normal compressibility, respiratory phasicity and response to augmentation. Saphenofemoral Junction: No evidence of thrombus. Normal compressibility and flow on color Doppler imaging. Profunda Femoral Vein: No evidence of thrombus. Normal compressibility and flow on color Doppler imaging. Femoral Vein: No evidence of thrombus. Normal compressibility, respiratory phasicity and response to augmentation. Popliteal Vein: No evidence of thrombus. Normal compressibility, respiratory phasicity and response to augmentation. Calf Veins: No evidence of thrombus. Normal compressibility and flow on color Doppler imaging. Superficial Great Saphenous Vein: No evidence of thrombus. Normal compressibility. Venous Reflux:  None. Other Findings:  None. IMPRESSION: No evidence of deep venous thrombosis in either lower extremity. Electronically Signed   By: Rudie Meyer M.D.   On: 03/18/2022 09:07   CT Angio Chest PE W and/or Wo Contrast  Result Date: 03/17/2022 CLINICAL DATA:  Left-sided chest pain. Pain when taking a deep breath. EXAM: CT ANGIOGRAPHY CHEST WITH CONTRAST TECHNIQUE: Multidetector CT imaging of the chest was performed using the standard protocol during bolus administration of intravenous contrast. Multiplanar CT image reconstructions and MIPs were obtained to evaluate the vascular anatomy. RADIATION DOSE REDUCTION: This exam was performed according to the departmental dose-optimization program which includes automated exposure control, adjustment of the mA and/or kV according to patient size and/or use of iterative reconstruction technique. CONTRAST:  75mL OMNIPAQUE IOHEXOL 350 MG/ML SOLN COMPARISON:  Two-view chest x-ray 03/17/2022. CT angio chest 02/07/2021  FINDINGS: Cardiovascular: The heart size is normal. Aorta and  great vessel origins are within normal limits. Pulmonary artery opacification is satisfactory. Focal filling defect is present at the branch point of the left intralobar artery into the basilar segmental branches, best seen on image 171 of series 6. Contrast opacification over the inferior lungs is suboptimal. Filling defect is suspected within the medial basilar segment on the right is concerning for additional small emboli. RV:LV ratio is within normal limits. Mediastinum/Nodes: Subcentimeter paratracheal nodes are present. No significant mediastinal, hilar nodes are present. A left axillary node measures up to 12 mm in short access. Other smaller axillary nodes are present bilaterally. Lungs/Pleura: Airspace opacities are present at the lung bases, left greater than right. A small left effusion is present. No nodule or mass lesion is present. No pneumothorax is present. Upper Abdomen: Visualized upper abdomen is unremarkable. Musculoskeletal: No chest wall abnormality. No acute or significant osseous findings. Review of the MIP images confirms the above findings. IMPRESSION: 1. Focal filling defect at the branch point of the left intralobar artery into the basilar segmental branches compatible with a pulmonary embolus. 2. Filling defect is suspected within the medial basilar segment on the right. 3. Airspace opacities at the lung bases, left greater than right likely related to the pulmonary emboli. Infection is considered less likely. 4. Small left effusion. 5. Enlarged left axillary node measuring up to 12 mm in short access. Other smaller axillary nodes are present bilaterally. These are likely reactive. Electronically Signed   By: San Morelle M.D.   On: 03/17/2022 15:13    Scheduled Meds:    apixaban  10 mg Oral BID   Followed by   Derrill Memo ON 03/25/2022] apixaban  5 mg Oral BID   methadone  110 mg Oral Daily   potassium chloride  40 mEq Oral Q4H   QUEtiapine  100 mg Oral QHS    Continuous  Infusions:    ceFEPime (MAXIPIME) IV 2 g (03/19/22 1317)   vancomycin 1,000 mg (03/19/22 0651)     LOS: 2 days     Vernell Leep, MD,  FACP, FHM, SFHM, [facility], [place]     To contact the attending provider between 7A-7P or the covering provider during after hours 7P-7A, please log into the web site [email or web address] and access using universal [place] password for that web site. If you do not have the password, please call the hospital operator.  03/19/2022, 1:30 PM

## 2022-03-19 NOTE — Consult Note (Addendum)
[facility]  [phone]) [phone] [phone]  ID: Lanice Shirts OB: 11/28/1985  MR#: [id number]  OZD#:[id number]  Patient Care Team: Pcp, No as PCP - General  REFERRING PROVIDER: Dr. Waymon Amato  REASON FOR REFERRAL: bilateral PE, anemia, IgA deficiency.   HPI: Shabreka Coulon is a 37 y.o. 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  CTA chest showed acute PE of the left interlobar artery into the basilar segmental branches and mid basilar segment on the right.  Venous Dopplers of lower extremity negative for DVT.  Patient was on IV heparin and then transition to Eliquis.  Patient denied any provoking factors for PE such as recent airflight, and use of oral contraceptive or family history or surgery.  No prior blood clots in the past.  On lab evaluation, patient was found to have chronic microcytic anemia.  On admission hemoglobin 8.6.  Today hemoglobin 7.  Patient is having vaginal bleeding.    REVIEW OF SYSTEMS:   ROS  As per HPI. Otherwise, a complete review of systems is negative.  PAST MEDICAL HISTORY: Past Medical History:  Diagnosis Date   Endocarditis    IgA deficiency (HCC)    Lupus (HCC)     PAST SURGICAL HISTORY: Past Surgical History:  Procedure Laterality Date   TEE WITHOUT CARDIOVERSION N/A 02/09/2021   Procedure: TRANSESOPHAGEAL ECHOCARDIOGRAM (TEE);  Surgeon: Antonieta Iba, MD;  Location: [facility] ORS;  Service: Cardiovascular;  Laterality: N/A;   TONSILLECTOMY      FAMILY HISTORY: History reviewed. No pertinent family history.  HEALTH MAINTENANCE: Social History   Tobacco Use   Smoking status: Every Day    Types:  Cigarettes   Smokeless tobacco: Never  Substance Use Topics   Alcohol use: Never   Drug use: Yes    Types: IV, Cocaine     Allergies  Allergen Reactions   Cefazolin Rash    Tolerated cefepime 02/20/20 Negative penicillin skin test 12/27/2020    Current Facility-Administered Medications  Medication Dose Route Frequency Provider Last Rate Last Admin   acetaminophen (TYLENOL) tablet 650 mg  650 mg Oral Q6H PRN Cox, Amy N, DO   650 mg at 03/19/22 2595   Or   acetaminophen (TYLENOL) suppository 650 mg  650 mg Rectal Q6H PRN Cox, Amy N, DO       apixaban (ELIQUIS) tablet 10 mg  10 mg Oral BID Foye Deer, RPH   10 mg at 03/19/22 1023   Followed by   Melene Muller ON 03/25/2022] apixaban (ELIQUIS) tablet 5 mg  5 mg Oral BID Foye Deer, RPH       ceFEPIme (MAXIPIME) 2 g in sodium chloride 0.9 % 100 mL IVPB  2 g Intravenous Q8H Ravishankar, Rhodia Albright, MD 200 mL/hr at 03/19/22 1317 2 g at 03/19/22 1317   iron sucrose (VENOFER) 200 mg in sodium chloride 0.9 % 100 mL IVPB  200 mg Intravenous Q24H Michaelyn Barter, MD       methadone (DOLOPHINE) 10 MG/ML solution 110 mg  110 mg Oral Daily Cox, Amy N, DO   110 mg at 03/19/22 6387   morphine (PF) 2 MG/ML injection 2 mg  2 mg Intravenous Q4H PRN Mansy, Vernetta Honey, MD   2 mg  at 03/19/22 1314   nicotine (NICODERM CQ - dosed in mg/24 hours) patch 21 mg  21 mg Transdermal Daily PRN Cox, Amy N, DO       ondansetron (ZOFRAN) tablet 4 mg  4 mg Oral Q6H PRN Cox, Amy N, DO       Or   ondansetron (ZOFRAN) injection 4 mg  4 mg Intravenous Q6H PRN Cox, Amy N, DO   4 mg at 03/17/22 1547   potassium chloride SA (KLOR-CON M) CR tablet 40 mEq  40 mEq Oral Q4H Hongalgi, Anand D, MD   40 mEq at 03/19/22 1313   QUEtiapine (SEROQUEL) tablet 100 mg  100 mg Oral QHS Cox, Amy N, DO   100 mg at 03/18/22 2208   senna-docusate (Senokot-S) tablet 1 tablet  1 tablet Oral QHS PRN Cox, Amy N, DO       vancomycin (VANCOCIN) IVPB 1000 mg/200 mL premix  1,000 mg Intravenous Q12H  Aleda Grana, RPH 200 mL/hr at 03/19/22 0651 1,000 mg at 03/19/22 0651    OBJECTIVE: Vitals:   03/19/22 0801 03/19/22 1536  BP: 115/64 124/64  Pulse: 94 95  Resp: 20 (!) 22  Temp: 97.9 F (36.6 C) 98.5 F (36.9 C)  SpO2: 94% 96%     Body mass index is 38.09 kg/m.      General: Well-developed, well-nourished, no acute distress. Eyes: Pink conjunctiva, anicteric sclera. HEENT: Normocephalic, moist mucous membranes, clear oropharnyx. Lungs: Clear to auscultation bilaterally. Heart: Regular rate and rhythm. No rubs, murmurs, or gallops. Abdomen: Soft, nontender, nondistended. No organomegaly noted, normoactive bowel sounds. Musculoskeletal: No edema, cyanosis, or clubbing. Neuro: Alert, answering all questions appropriately. Cranial nerves grossly intact. Skin: No rashes or petechiae noted. Psych: Normal affect. Lymphatics: No cervical, calvicular, axillary or inguinal LAD.   LAB RESULTS:  Lab Results  Component Value Date   NA 136 03/19/2022   K 3.2 (L) 03/19/2022   CL 108 03/19/2022   CO2 21 (L) 03/19/2022   GLUCOSE 109 (H) 03/19/2022   BUN 10 03/19/2022   CREATININE 0.69 03/19/2022   CALCIUM 8.0 (L) 03/19/2022   PROT 7.1 03/18/2022   ALBUMIN 3.1 (L) 03/18/2022   AST 24 03/18/2022   ALT 34 03/18/2022   ALKPHOS 179 (H) 03/18/2022   BILITOT 0.3 03/18/2022   GFRNONAA >60 03/19/2022    Lab Results  Component Value Date   WBC 10.9 (H) 03/19/2022   NEUTROABS 11.3 (H) 03/17/2022   HGB 7.0 (L) 03/19/2022   HCT 23.1 (L) 03/19/2022   MCV 75.5 (L) 03/19/2022   PLT 439 (H) 03/19/2022    No results found for: "TIBC", "FERRITIN", "IRONPCTSAT"   STUDIES: ECHOCARDIOGRAM COMPLETE  Result Date: 03/18/2022    ECHOCARDIOGRAM REPORT   Patient Name:   MAELYS KINNICK Date of Exam: 03/18/2022 Medical Rec #:  [id number]  Height:       63.0 in Accession #:    [id number] Weight:       215.0 lb Date of Birth:  1986-02-08  BSA:          1.994 m Patient Age:    36 years   BP:            106/56 mmHg Patient Gender: F          HR:           87 bpm. Exam Location:  [facility] Procedure: 2D Echo, Cardiac Doppler and Color Doppler Indications:     Pulmonary embolus  History:  Patient has prior history of Echocardiogram examinations, most                  recent 02/07/2021. Signs/Symptoms:Bacteremia; Risk                  Factors:Current Smoker. Substance abuse, IVDU.  Sonographer:     Wenda Low Referring Phys:  [place] Diagnosing Phys: Kathlyn Sacramento MD IMPRESSIONS  1. Left ventricular ejection fraction, by estimation, is 70 to 75%. The left ventricle has hyperdynamic function. The left ventricle has no regional wall motion abnormalities. Left ventricular diastolic parameters were normal.  2. Right ventricular systolic function is normal. The right ventricular size is normal. Tricuspid regurgitation signal is inadequate for assessing PA pressure.  3. The mitral valve is normal in structure. No evidence of mitral valve regurgitation. No evidence of mitral stenosis.  4. The aortic valve is normal in structure. Aortic valve regurgitation is not visualized. No aortic stenosis is present.  5. The inferior vena cava is normal in size with greater than 50% respiratory variability, suggesting right atrial pressure of 3 mmHg. FINDINGS  Left Ventricle: Left ventricular ejection fraction, by estimation, is 70 to 75%. The left ventricle has hyperdynamic function. The left ventricle has no regional wall motion abnormalities. The left ventricular internal cavity size was normal in size. There is no left ventricular hypertrophy. Left ventricular diastolic parameters were normal. Right Ventricle: The right ventricular size is normal. No increase in right ventricular wall thickness. Right ventricular systolic function is normal. Tricuspid regurgitation signal is inadequate for assessing PA pressure. The tricuspid regurgitant velocity is 2.45 m/s, and with an assumed right atrial pressure of 3  mmHg, the estimated right ventricular systolic pressure is 18.2 mmHg. Left Atrium: Left atrial size was normal in size. Right Atrium: Right atrial size was normal in size. Pericardium: There is no evidence of pericardial effusion. Mitral Valve: The mitral valve is normal in structure. No evidence of mitral valve regurgitation. No evidence of mitral valve stenosis. MV peak gradient, 5.6 mmHg. The mean mitral valve gradient is 2.0 mmHg. Tricuspid Valve: The tricuspid valve is normal in structure. Tricuspid valve regurgitation is trivial. No evidence of tricuspid stenosis. Aortic Valve: The aortic valve is normal in structure. Aortic valve regurgitation is not visualized. No aortic stenosis is present. Aortic valve mean gradient measures 8.0 mmHg. Aortic valve peak gradient measures 16.3 mmHg. Aortic valve area, by VTI measures 2.40 cm. Pulmonic Valve: The pulmonic valve was normal in structure. Pulmonic valve regurgitation is not visualized. No evidence of pulmonic stenosis. Aorta: The aortic root is normal in size and structure. Venous: The inferior vena cava is normal in size with greater than 50% respiratory variability, suggesting right atrial pressure of 3 mmHg. IAS/Shunts: No atrial level shunt detected by color flow Doppler.  LEFT VENTRICLE PLAX 2D LVIDd:         4.80 cm   Diastology LVIDs:         2.70 cm   LV e' medial:    16.20 cm/s LV PW:         1.30 cm   LV E/e' medial:  7.6 LV IVS:        1.00 cm   LV e' lateral:   17.10 cm/s LVOT diam:     2.10 cm   LV E/e' lateral: 7.2 LV SV:         95 LV SV Index:   48 LVOT Area:     3.46 cm  RIGHT VENTRICLE RV Basal diam:  3.30 cm RV Mid diam:    3.50 cm RV S prime:     18.00 cm/s TAPSE (M-mode): 2.6 cm LEFT ATRIUM             Index        RIGHT ATRIUM           Index LA diam:        3.70 cm 1.86 cm/m   RA Area:     18.00 cm LA Vol (A2C):   54.4 ml 27.28 ml/m  RA Volume:   46.20 ml  23.17 ml/m LA Vol (A4C):   39.4 ml 19.76 ml/m LA Biplane Vol: 48.2 ml 24.17  ml/m  AORTIC VALVE                     PULMONIC VALVE AV Area (Vmax):    2.71 cm      PV Vmax:       1.00 m/s AV Area (Vmean):   2.43 cm      PV Peak grad:  4.0 mmHg AV Area (VTI):     2.40 cm AV Vmax:           202.00 cm/s AV Vmean:          127.000 cm/s AV VTI:            0.395 m AV Peak Grad:      16.3 mmHg AV Mean Grad:      8.0 mmHg LVOT Vmax:         158.00 cm/s LVOT Vmean:        89.000 cm/s LVOT VTI:          0.274 m LVOT/AV VTI ratio: 0.69  AORTA Ao Root diam: 3.60 cm MITRAL VALVE                TRICUSPID VALVE MV Area (PHT): 4.52 cm     TR Peak grad:   24.0 mmHg MV Area VTI:   3.18 cm     TR Vmax:        245.00 cm/s MV Peak grad:  5.6 mmHg MV Mean grad:  2.0 mmHg     SHUNTS MV Vmax:       1.18 m/s     Systemic VTI:  0.27 m MV Vmean:      72.8 cm/s    Systemic Diam: 2.10 cm MV Decel Time: 168 msec MV E velocity: 123.00 cm/s MV A velocity: 78.00 cm/s MV E/A ratio:  1.58 Kathlyn Sacramento MD Electronically signed by Kathlyn Sacramento MD Signature Date/Time: 03/18/2022/1:16:45 PM    Final    US Venous Img Lower Bilateral (DVT)  Result Date: 03/18/2022 CLINICAL DATA:  Pulmonary embolism. EXAM: BILATERAL LOWER EXTREMITY VENOUS DOPPLER ULTRASOUND TECHNIQUE: Gray-scale sonography with graded compression, as well as color Doppler and duplex ultrasound were performed to evaluate the lower extremity deep venous systems from the level of the common femoral vein and including the common femoral, femoral, profunda femoral, popliteal and calf veins including the posterior tibial, peroneal and gastrocnemius veins when visible. The superficial great saphenous vein was also interrogated. Spectral Doppler was utilized to evaluate flow at rest and with distal augmentation maneuvers in the common femoral, femoral and popliteal veins. COMPARISON:  None Available. FINDINGS: RIGHT LOWER EXTREMITY Common Femoral Vein: No evidence of thrombus. Normal compressibility, respiratory phasicity and response to augmentation.  Saphenofemoral Junction: No evidence of thrombus. Normal compressibility and flow on color Doppler imaging.  Profunda Femoral Vein: No evidence of thrombus. Normal compressibility and flow on color Doppler imaging. Femoral Vein: No evidence of thrombus. Normal compressibility, respiratory phasicity and response to augmentation. Popliteal Vein: No evidence of thrombus. Normal compressibility, respiratory phasicity and response to augmentation. Calf Veins: No evidence of thrombus. Normal compressibility and flow on color Doppler imaging. Superficial Great Saphenous Vein: No evidence of thrombus. Normal compressibility. Venous Reflux:  None. Other Findings:  None. LEFT LOWER EXTREMITY Common Femoral Vein: No evidence of thrombus. Normal compressibility, respiratory phasicity and response to augmentation. Saphenofemoral Junction: No evidence of thrombus. Normal compressibility and flow on color Doppler imaging. Profunda Femoral Vein: No evidence of thrombus. Normal compressibility and flow on color Doppler imaging. Femoral Vein: No evidence of thrombus. Normal compressibility, respiratory phasicity and response to augmentation. Popliteal Vein: No evidence of thrombus. Normal compressibility, respiratory phasicity and response to augmentation. Calf Veins: No evidence of thrombus. Normal compressibility and flow on color Doppler imaging. Superficial Great Saphenous Vein: No evidence of thrombus. Normal compressibility. Venous Reflux:  None. Other Findings:  None. IMPRESSION: No evidence of deep venous thrombosis in either lower extremity. Electronically Signed   By: Rudie MeyerP.  Gallerani M.D.   On: 03/18/2022 09:07   CT Angio Chest PE W and/or Wo Contrast  Result Date: 03/17/2022 CLINICAL DATA:  Left-sided chest pain. Pain when taking a deep breath. EXAM: CT ANGIOGRAPHY CHEST WITH CONTRAST TECHNIQUE: Multidetector CT imaging of the chest was performed using the standard protocol during bolus administration of intravenous  contrast. Multiplanar CT image reconstructions and MIPs were obtained to evaluate the vascular anatomy. RADIATION DOSE REDUCTION: This exam was performed according to the departmental dose-optimization program which includes automated exposure control, adjustment of the mA and/or kV according to patient size and/or use of iterative reconstruction technique. CONTRAST:  75mL OMNIPAQUE IOHEXOL 350 MG/ML SOLN COMPARISON:  Two-view chest x-ray 03/17/2022. CT angio chest 02/07/2021 FINDINGS: Cardiovascular: The heart size is normal. Aorta and great vessel origins are within normal limits. Pulmonary artery opacification is satisfactory. Focal filling defect is present at the branch point of the left intralobar artery into the basilar segmental branches, best seen on image 171 of series 6. Contrast opacification over the inferior lungs is suboptimal. Filling defect is suspected within the medial basilar segment on the right is concerning for additional small emboli. RV:LV ratio is within normal limits. Mediastinum/Nodes: Subcentimeter paratracheal nodes are present. No significant mediastinal, hilar nodes are present. A left axillary node measures up to 12 mm in short access. Other smaller axillary nodes are present bilaterally. Lungs/Pleura: Airspace opacities are present at the lung bases, left greater than right. A small left effusion is present. No nodule or mass lesion is present. No pneumothorax is present. Upper Abdomen: Visualized upper abdomen is unremarkable. Musculoskeletal: No chest wall abnormality. No acute or significant osseous findings. Review of the MIP images confirms the above findings. IMPRESSION: 1. Focal filling defect at the branch point of the left intralobar artery into the basilar segmental branches compatible with a pulmonary embolus. 2. Filling defect is suspected within the medial basilar segment on the right. 3. Airspace opacities at the lung bases, left greater than right likely related to  the pulmonary emboli. Infection is considered less likely. 4. Small left effusion. 5. Enlarged left axillary node measuring up to 12 mm in short access. Other smaller axillary nodes are present bilaterally. These are likely reactive. Electronically Signed   By: Marin Robertshristopher  Mattern M.D.   On: 03/17/2022 15:13   DG Chest 2  View  Result Date: 03/17/2022 CLINICAL DATA:  37 year old female with chest pain and shortness of breath. History of endocarditis. EXAM: CHEST - 2 VIEW COMPARISON:  Chest radiographs 06/04/2021 and earlier. FINDINGS: Mildly lower lung volumes. Mediastinal contours remain normal. Visualized tracheal air column is within normal limits. Lung markings are stable, within normal limits. No pneumothorax or pleural effusion. No acute pulmonary opacity. No acute osseous abnormality identified. Negative visible bowel gas pattern. IMPRESSION: Lower lung volumes.  No acute cardiopulmonary abnormality. Electronically Signed   By: Odessa Fleming M.D.   On: 03/17/2022 10:21    ASSESSMENT AND PLAN:   Katrice Goel is a 37 y.o. 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  CTA chest showed bilateral PE.  Venous Dopplers negative for DVT.  # Acute PE diagnosed on 03/17/2022 # Iron deficiency anemia # IgA deficiency # Acute on chronic anemia from blood loss  Patient was started on IV heparin.  Now on Eliquis.  Labs were reviewed from care everywhere.  Iron panel showed severe iron deficiency.  IgA levels checked in 2021 were less than 6.  The patient has severe IgA deficiency. Patient is having vaginal bleeding since yesterday.  Her hemoglobin has slowly trended down from 8.6-7.  She has never received blood transfusion.   With her severe IgA deficiency there carries a risk of anaphylactic reaction with blood transfusion including washed RBC. Would consider blood transfusion when hemoglobin goes below 6.  Recommend IV Venofer 200 mg x 5 doses starting today.  Add Retacrit 20,000 units.  Recheck immunoglobulin levels.   I have ordered 2 units of washed PRBC. Type/screen. Spoke with the blood bank and blood will have to be obtained from [place]. She will have to be premedicated with tylenol 650 mg, benadryl 25 mg and solumedrol 40 mg. Orders are placed. Patient should be closely monitored for anaphylactic reaction and should have emergency medications in hand - Epipen and solumedrol 125 mg. Minimize blood draws.   Consider OB consult to assess for role of any progestins to stop bleeding.   Consider transitioning to IV heparin since it has a shorter half life.    Patient expressed understanding and was in agreement with this plan. She also understands that She can call clinic at any time with any questions, concerns, or complaints.   I spent a total of 60 minutes reviewing chart data, face-to-face evaluation with the patient, counseling and coordination of care as detailed above.  Michaelyn Barter, MD   03/19/2022 4:41 PM

## 2022-03-19 NOTE — Plan of Care (Signed)
  Problem: Fluid Volume: Goal: Hemodynamic stability will improve Outcome: Progressing   Problem: Clinical Measurements: Goal: Diagnostic test results will improve Outcome: Progressing Goal: Signs and symptoms of infection will decrease Outcome: Progressing   Problem: Respiratory: Goal: Ability to maintain adequate ventilation will improve Outcome: Progressing   Problem: Education: Goal: Knowledge of General Education information will improve Description: Including pain rating scale, medication(s)/side effects and non-pharmacologic comfort measures Outcome: Progressing   Problem: Health Behavior/Discharge Planning: Goal: Ability to manage health-related needs will improve Outcome: Progressing   Problem: Clinical Measurements: Goal: Ability to maintain clinical measurements within normal limits will improve Outcome: Progressing Goal: Will remain free from infection Outcome: Progressing   Problem: Activity: Goal: Risk for activity intolerance will decrease Outcome: Progressing   Problem: Nutrition: Goal: Adequate nutrition will be maintained Outcome: Progressing   Problem: Coping: Goal: Level of anxiety will decrease Outcome: Progressing   Problem: Elimination: Goal: Will not experience complications related to bowel motility Outcome: Progressing Goal: Will not experience complications related to urinary retention Outcome: Progressing   Problem: Pain Managment: Goal: General experience of comfort will improve Outcome: Progressing   Problem: Safety: Goal: Ability to remain free from injury will improve Outcome: Progressing   Problem: Skin Integrity: Goal: Risk for impaired skin integrity will decrease Outcome: Progressing

## 2022-03-19 NOTE — Consult Note (Addendum)
ANTICOAGULATION CONSULT NOTE - Initial Consult  Pharmacy Consult for heparin Indication: pulmonary embolus  Allergies  Allergen Reactions   Cefazolin Rash    Tolerated cefepime 02/20/20 Negative penicillin skin test 12/27/2020    Patient Measurements: Height: 5\' 3"  (160 cm) Weight: 97.5 kg (215 lb) IBW/kg (Calculated) : 52.4 Heparin Dosing Weight: 75.1  Vital Signs: Temp: 98.5 F (36.9 C) (01/16 1536) BP: 124/64 (01/16 1536) Pulse Rate: 95 (01/16 1536)  Labs: Recent Labs    03/17/22 1030 03/17/22 1431 03/17/22 2330 03/18/22 0511 03/19/22 0629  HGB 8.6*  --   --  7.5* 7.0*  HCT 28.0*  --   --  26.1* 23.1*  PLT 531*  --   --  454* 439*  HEPARINUNFRC  --   --  <0.10* 0.21*  --   CREATININE 0.81  --   --  0.75 0.69  TROPONINIHS 5 4  --   --   --     Estimated Creatinine Clearance: 110.1 mL/min (by C-G formula based on SCr of 0.69 mg/dL).   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 Admitted for bilateral PE noted on CTA chest. Patient was started on apixaban on 1/15 but pharmacy has now been consulted to transition to heparin. Per Dr. Darrall Dears, transitioning to heparin until pRBCs are available, potentially as soon as 1/17. While Hgb trending down, lowering threshold for transfusion potentially as low as <6, because due to severe IgA deficiency, there is a higher risk for allergic reaction even washed RBCs.  Date Time aPTT/HL Rate/Comment       Goal of Therapy:  aPTT 66-102 seconds Monitor platelets by anticoagulation protocol: Yes  Baseline Labs: aPTT - ordered; INR - ordered Hgb - 8.6 >> 7.5 >> 7.0; Plts - 439   Plan: Last dose of  apixaban 1/16 at 1023 Give 2500 units bolus x1; then start heparin infusion at 1250 units/hr This is a half bolus due to low Hgb, but apixaban is a new start so patient not at apixaban steady state yet for an acute PE Check aPTT/Anti-Xa level in 6 hours and daily once consecutively therapeutic.  Titrate by aPTT's until lab correlation is noted, then titrate by anti-xa alone. Continue to monitor H&H and platelets daily while on heparin gtt.

## 2022-03-19 NOTE — Progress Notes (Incomplete)
Date of Admission:  03/17/2022   Total days of antibiotics ***        Day ***        Day ***        Day ***   ID: Karen Clarke is a 37 y.o. female with  *** Principal Problem:   Bilateral pulmonary embolism (HCC) Active Problems:   Hx of bacterial endocarditis   IVDU (intravenous drug user)   Positive hepatitis C antibody test   Polysubstance abuse (HCC)   Tobacco abuse   Methadone dependence (HCC)    Subjective: ***  Medications:   apixaban  10 mg Oral BID   Followed by   Derrill Memo ON 03/25/2022] apixaban  5 mg Oral BID   methadone  110 mg Oral Daily   potassium chloride  40 mEq Oral Q4H   QUEtiapine  100 mg Oral QHS    Objective: Vital signs in last 24 hours: Temp:  [97.5 F (36.4 C)-101 F (38.3 C)] 97.9 F (36.6 C) (01/16 0801) Pulse Rate:  [94-111] 94 (01/16 0801) Resp:  [18-20] 20 (01/16 0801) BP: (115-122)/(64-75) 115/64 (01/16 0801) SpO2:  [94 %-99 %] 94 % (01/16 0801)  LDA Foley Central lines Other catheters  PHYSICAL EXAM:  General: Alert, cooperative, no distress, appears stated age.  Head: Normocephalic, without obvious abnormality, atraumatic. Eyes: Conjunctivae clear, anicteric sclerae. Pupils are equal ENT Nares normal. No drainage or sinus tenderness. Lips, mucosa, and tongue normal. No Thrush Neck: Supple, symmetrical, no adenopathy, thyroid: non tender no carotid bruit and no JVD. Back: No CVA tenderness. Lungs: Clear to auscultation bilaterally. No Wheezing or Rhonchi. No rales. Heart: Regular rate and rhythm, no murmur, rub or gallop. Abdomen: Soft, non-tender,not distended. Bowel sounds normal. No masses Extremities: atraumatic, no cyanosis. No edema. No clubbing Skin: No rashes or lesions. Or bruising Lymph: Cervical, supraclavicular normal. Neurologic: Grossly non-focal  Lab Results Recent Labs    03/18/22 0511 03/19/22 0629  WBC 11.6* 10.9*  HGB 7.5* 7.0*  HCT 26.1* 23.1*  NA 138 136  K 3.5 3.2*  CL 110 108  CO2 19* 21*   BUN 14 10  CREATININE 0.75 0.69   Liver Panel Recent Labs    03/18/22 0511  PROT 7.1  ALBUMIN 3.1*  AST 24  ALT 34  ALKPHOS 179*  BILITOT 0.3   Sedimentation Rate Recent Labs    03/18/22 1215  ESRSEDRATE 107*   C-Reactive Protein Recent Labs    03/18/22 1215  CRP 20.5*    Microbiology:  Studies/Results: ECHOCARDIOGRAM COMPLETE  Result Date: 03/18/2022    ECHOCARDIOGRAM REPORT   Patient Name:   SHANETTE TAMARGO Date of Exam: 03/18/2022 Medical Rec #:  [id number]  Height:       63.0 in Accession #:    [id number] Weight:       215.0 lb Date of Birth:  1986/01/23  BSA:          1.994 m Patient Age:    78 years   BP:           106/56 mmHg Patient Gender: F          HR:           87 bpm. Exam Location:  [facility] Procedure: 2D Echo, Cardiac Doppler and Color Doppler Indications:     Pulmonary embolus  History:         Patient has prior history of Echocardiogram examinations, most  recent 02/07/2021. Signs/Symptoms:Bacteremia; Risk                  Factors:Current Smoker. Substance abuse, IVDU.  Sonographer:     Mikki Harbor Referring Phys:  [id number] Theadora Rama D HONGALGI Diagnosing Phys: Lorine Bears MD IMPRESSIONS  1. Left ventricular ejection fraction, by estimation, is 70 to 75%. The left ventricle has hyperdynamic function. The left ventricle has no regional wall motion abnormalities. Left ventricular diastolic parameters were normal.  2. Right ventricular systolic function is normal. The right ventricular size is normal. Tricuspid regurgitation signal is inadequate for assessing PA pressure.  3. The mitral valve is normal in structure. No evidence of mitral valve regurgitation. No evidence of mitral stenosis.  4. The aortic valve is normal in structure. Aortic valve regurgitation is not visualized. No aortic stenosis is present.  5. The inferior vena cava is normal in size with greater than 50% respiratory variability, suggesting right atrial pressure of 3 mmHg. FINDINGS  Left  Ventricle: Left ventricular ejection fraction, by estimation, is 70 to 75%. The left ventricle has hyperdynamic function. The left ventricle has no regional wall motion abnormalities. The left ventricular internal cavity size was normal in size. There is no left ventricular hypertrophy. Left ventricular diastolic parameters were normal. Right Ventricle: The right ventricular size is normal. No increase in right ventricular wall thickness. Right ventricular systolic function is normal. Tricuspid regurgitation signal is inadequate for assessing PA pressure. The tricuspid regurgitant velocity is 2.45 m/s, and with an assumed right atrial pressure of 3 mmHg, the estimated right ventricular systolic pressure is 27.0 mmHg. Left Atrium: Left atrial size was normal in size. Right Atrium: Right atrial size was normal in size. Pericardium: There is no evidence of pericardial effusion. Mitral Valve: The mitral valve is normal in structure. No evidence of mitral valve regurgitation. No evidence of mitral valve stenosis. MV peak gradient, 5.6 mmHg. The mean mitral valve gradient is 2.0 mmHg. Tricuspid Valve: The tricuspid valve is normal in structure. Tricuspid valve regurgitation is trivial. No evidence of tricuspid stenosis. Aortic Valve: The aortic valve is normal in structure. Aortic valve regurgitation is not visualized. No aortic stenosis is present. Aortic valve mean gradient measures 8.0 mmHg. Aortic valve peak gradient measures 16.3 mmHg. Aortic valve area, by VTI measures 2.40 cm. Pulmonic Valve: The pulmonic valve was normal in structure. Pulmonic valve regurgitation is not visualized. No evidence of pulmonic stenosis. Aorta: The aortic root is normal in size and structure. Venous: The inferior vena cava is normal in size with greater than 50% respiratory variability, suggesting right atrial pressure of 3 mmHg. IAS/Shunts: No atrial level shunt detected by color flow Doppler.  LEFT VENTRICLE PLAX 2D LVIDd:          4.80 cm   Diastology LVIDs:         2.70 cm   LV e' medial:    16.20 cm/s LV PW:         1.30 cm   LV E/e' medial:  7.6 LV IVS:        1.00 cm   LV e' lateral:   17.10 cm/s LVOT diam:     2.10 cm   LV E/e' lateral: 7.2 LV SV:         95 LV SV Index:   48 LVOT Area:     3.46 cm  RIGHT VENTRICLE RV Basal diam:  3.30 cm RV Mid diam:    3.50 cm RV S prime:  18.00 cm/s TAPSE (M-mode): 2.6 cm LEFT ATRIUM             Index        RIGHT ATRIUM           Index LA diam:        3.70 cm 1.86 cm/m   RA Area:     18.00 cm LA Vol (A2C):   54.4 ml 27.28 ml/m  RA Volume:   46.20 ml  23.17 ml/m LA Vol (A4C):   39.4 ml 19.76 ml/m LA Biplane Vol: 48.2 ml 24.17 ml/m  AORTIC VALVE                     PULMONIC VALVE AV Area (Vmax):    2.71 cm      PV Vmax:       1.00 m/s AV Area (Vmean):   2.43 cm      PV Peak grad:  4.0 mmHg AV Area (VTI):     2.40 cm AV Vmax:           202.00 cm/s AV Vmean:          127.000 cm/s AV VTI:            0.395 m AV Peak Grad:      16.3 mmHg AV Mean Grad:      8.0 mmHg LVOT Vmax:         158.00 cm/s LVOT Vmean:        89.000 cm/s LVOT VTI:          0.274 m LVOT/AV VTI ratio: 0.69  AORTA Ao Root diam: 3.60 cm MITRAL VALVE                TRICUSPID VALVE MV Area (PHT): 4.52 cm     TR Peak grad:   24.0 mmHg MV Area VTI:   3.18 cm     TR Vmax:        245.00 cm/s MV Peak grad:  5.6 mmHg MV Mean grad:  2.0 mmHg     SHUNTS MV Vmax:       1.18 m/s     Systemic VTI:  0.27 m MV Vmean:      72.8 cm/s    Systemic Diam: 2.10 cm MV Decel Time: 168 msec MV E velocity: 123.00 cm/s MV A velocity: 78.00 cm/s MV E/A ratio:  1.58 Kathlyn Sacramento MD Electronically signed by Kathlyn Sacramento MD Signature Date/Time: 03/18/2022/1:16:45 PM    Final    US Venous Img Lower Bilateral (DVT)  Result Date: 03/18/2022 CLINICAL DATA:  Pulmonary embolism. EXAM: BILATERAL LOWER EXTREMITY VENOUS DOPPLER ULTRASOUND TECHNIQUE: Gray-scale sonography with graded compression, as well as color Doppler and duplex ultrasound were performed  to evaluate the lower extremity deep venous systems from the level of the common femoral vein and including the common femoral, femoral, profunda femoral, popliteal and calf veins including the posterior tibial, peroneal and gastrocnemius veins when visible. The superficial great saphenous vein was also interrogated. Spectral Doppler was utilized to evaluate flow at rest and with distal augmentation maneuvers in the common femoral, femoral and popliteal veins. COMPARISON:  None Available. FINDINGS: RIGHT LOWER EXTREMITY Common Femoral Vein: No evidence of thrombus. Normal compressibility, respiratory phasicity and response to augmentation. Saphenofemoral Junction: No evidence of thrombus. Normal compressibility and flow on color Doppler imaging. Profunda Femoral Vein: No evidence of thrombus. Normal compressibility and flow on color Doppler imaging. Femoral Vein: No evidence of thrombus. Normal compressibility, respiratory  phasicity and response to augmentation. Popliteal Vein: No evidence of thrombus. Normal compressibility, respiratory phasicity and response to augmentation. Calf Veins: No evidence of thrombus. Normal compressibility and flow on color Doppler imaging. Superficial Great Saphenous Vein: No evidence of thrombus. Normal compressibility. Venous Reflux:  None. Other Findings:  None. LEFT LOWER EXTREMITY Common Femoral Vein: No evidence of thrombus. Normal compressibility, respiratory phasicity and response to augmentation. Saphenofemoral Junction: No evidence of thrombus. Normal compressibility and flow on color Doppler imaging. Profunda Femoral Vein: No evidence of thrombus. Normal compressibility and flow on color Doppler imaging. Femoral Vein: No evidence of thrombus. Normal compressibility, respiratory phasicity and response to augmentation. Popliteal Vein: No evidence of thrombus. Normal compressibility, respiratory phasicity and response to augmentation. Calf Veins: No evidence of thrombus. Normal  compressibility and flow on color Doppler imaging. Superficial Great Saphenous Vein: No evidence of thrombus. Normal compressibility. Venous Reflux:  None. Other Findings:  None. IMPRESSION: No evidence of deep venous thrombosis in either lower extremity. Electronically Signed   By: Marijo Sanes M.D.   On: 03/18/2022 09:07   CT Angio Chest PE W and/or Wo Contrast  Result Date: 03/17/2022 CLINICAL DATA:  Left-sided chest pain. Pain when taking a deep breath. EXAM: CT ANGIOGRAPHY CHEST WITH CONTRAST TECHNIQUE: Multidetector CT imaging of the chest was performed using the standard protocol during bolus administration of intravenous contrast. Multiplanar CT image reconstructions and MIPs were obtained to evaluate the vascular anatomy. RADIATION DOSE REDUCTION: This exam was performed according to the departmental dose-optimization program which includes automated exposure control, adjustment of the mA and/or kV according to patient size and/or use of iterative reconstruction technique. CONTRAST:  79mL OMNIPAQUE IOHEXOL 350 MG/ML SOLN COMPARISON:  Two-view chest x-ray 03/17/2022. CT angio chest 02/07/2021 FINDINGS: Cardiovascular: The heart size is normal. Aorta and great vessel origins are within normal limits. Pulmonary artery opacification is satisfactory. Focal filling defect is present at the branch point of the left intralobar artery into the basilar segmental branches, best seen on image 171 of series 6. Contrast opacification over the inferior lungs is suboptimal. Filling defect is suspected within the medial basilar segment on the right is concerning for additional small emboli. RV:LV ratio is within normal limits. Mediastinum/Nodes: Subcentimeter paratracheal nodes are present. No significant mediastinal, hilar nodes are present. A left axillary node measures up to 12 mm in short access. Other smaller axillary nodes are present bilaterally. Lungs/Pleura: Airspace opacities are present at the lung bases,  left greater than right. A small left effusion is present. No nodule or mass lesion is present. No pneumothorax is present. Upper Abdomen: Visualized upper abdomen is unremarkable. Musculoskeletal: No chest wall abnormality. No acute or significant osseous findings. Review of the MIP images confirms the above findings. IMPRESSION: 1. Focal filling defect at the branch point of the left intralobar artery into the basilar segmental branches compatible with a pulmonary embolus. 2. Filling defect is suspected within the medial basilar segment on the right. 3. Airspace opacities at the lung bases, left greater than right likely related to the pulmonary emboli. Infection is considered less likely. 4. Small left effusion. 5. Enlarged left axillary node measuring up to 12 mm in short access. Other smaller axillary nodes are present bilaterally. These are likely reactive. Electronically Signed   By: San Morelle M.D.   On: 03/17/2022 15:13     Assessment/Plan: ***

## 2022-03-19 NOTE — Progress Notes (Signed)
Responded to consult for IV. Pt currently has 2 working IV sites. Discussed treatment plan and order with RN. RN agrees okay to hold on additional IV access for now. VAS Team available for any further needs.

## 2022-03-19 NOTE — TOC Initial Note (Signed)
Transition of Care [facility]) - Initial/Assessment Note    Patient Details  Name: Karen Clarke MRN: [id number] Date of Birth: 1985-04-22  Transition of [facility]) CM/SW Contact:    Beverly Sessions, RN Phone Number: 03/19/2022, 2:17 PM  Clinical Narrative:                   Transition of Care (TOC) Screening Note   Patient Details  Name: Karen Clarke Date of Birth: [birth year]   Transition of Care [facility]) CM/SW Contact:    Beverly Sessions, RN Phone Number: 03/19/2022, 2:17 PM    Transition of Care Department [facility]) has reviewed patient and no TOC needs have been identified at this time. We will continue to monitor patient advancement through interdisciplinary progression rounds. If new patient transition needs arise, please place a TOC consult.  WOC RN recommending outpatient wound care referral.  Patient has been to [facility] outpatient wound care in the past.  If indicated MD would have to place referral.         Patient Goals and CMS Choice            Expected Discharge Plan and Services                                              Prior Living Arrangements/Services                       Activities of Daily Living Home Assistive Devices/Equipment: None ADL Screening (condition at time of admission) Patient's cognitive ability adequate to safely complete daily activities?: Yes Is the patient deaf or have difficulty hearing?: No Does the patient have difficulty seeing, even when wearing glasses/contacts?: No Does the patient have difficulty concentrating, remembering, or making decisions?: No Patient able to express need for assistance with ADLs?: Yes Does the patient have difficulty dressing or bathing?: No Independently performs ADLs?: Yes (appropriate for developmental age) Does the patient have difficulty walking or climbing stairs?: No Weakness of Legs: None Weakness of Arms/Hands: None  Permission Sought/Granted                   Emotional Assessment              Admission diagnosis:  Bilateral pulmonary embolism ([place]) [I26.99] Other pulmonary embolism without acute cor pulmonale, unspecified chronicity ([place]) [I26.99] Patient Active Problem List   Diagnosis Date Noted   Bilateral pulmonary embolism ([place]) 03/17/2022   Methadone dependence ([place]) 03/17/2022   Cellulitis of left breast 04/06/2021   Cellulitis of right breast 04/03/2021   Sepsis due to cellulitis ([place]) 03/01/2021   Bacteremia 02/08/2021   Cellulitis of right lower extremity 02/07/2021   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   Polysubstance abuse ([place]) 06/21/2018   Pseudotumor cerebri 06/21/2018   Tobacco abuse 06/21/2018   IVDU (intravenous drug user) 06/16/2018   Positive hepatitis C antibody test 03/27/2016   PCP:  Pcp, No Pharmacy:   [facility] [place] [address] Phone: [phone] Fax: [id number] - [address] [place] [address] Phone: [phone] Fax: [phone]     Social Determinants of Health (SDOH) Social History: SDOH Screenings   Food Insecurity: No Food  Insecurity (03/18/2022)  Housing: Low Risk  (03/18/2022)  Transportation Needs: No Transportation Needs (03/18/2022)  Utilities: Not At Risk (03/18/2022)  Tobacco Use: High Risk (03/18/2022)   SDOH Interventions:     Readmission Risk Interventions    04/06/2021    9:31 AM  Readmission Risk Prevention Plan  Transportation Screening Complete  PCP or Specialist Appt within 3-5 Days Complete  Social Work Consult for [place] Planning/Counseling Complete  Palliative Care Screening Not Applicable  Medication Review [employer]) Complete

## 2022-03-20 LAB — HCV RNA QUANT: HCV Quantitative: NOT DETECTED IU/mL (ref >50)

## 2022-03-20 LAB — CULTURE, BLOOD (ROUTINE X 2): Special Requests: ADEQUATE

## 2022-03-20 LAB — APTT: aPTT: 58 seconds — ABNORMAL HIGH (ref 24–36)

## 2022-03-20 LAB — PREPARE RBC (CROSSMATCH)

## 2022-03-20 MED ORDER — METHADONE HCL 5 MG/5ML PO SOLN: 110.0000 mg | Freq: Every day | ORAL | Status: DC

## 2022-03-20 MED ORDER — METHADONE HCL 10 MG/ML PO CONC
110.0000 mg | Freq: Every day | ORAL | Status: DC
  Administered 2022-03-20: 110 mg via ORAL
  Filled 2022-03-20: qty 15

## 2022-03-20 MED ORDER — HEPARIN BOLUS VIA INFUSION
1100.0000 [IU] | Freq: Once | INTRAVENOUS | Status: AC
  Administered 2022-03-20: 1100 [IU] via INTRAVENOUS
  Filled 2022-03-20: qty 1100

## 2022-03-20 NOTE — Plan of Care (Signed)
  Problem: Fluid Volume: Goal: Hemodynamic stability will improve Outcome: Progressing   Problem: Respiratory: Goal: Ability to maintain adequate ventilation will improve Outcome: Progressing   Problem: Coping: Goal: Level of anxiety will decrease Outcome: Progressing   Problem: Pain Managment: Goal: General experience of comfort will improve Outcome: Progressing   Problem: Safety: Goal: Ability to remain free from injury will improve Outcome: Progressing

## 2022-03-20 NOTE — Progress Notes (Signed)
Responded to consult for IV. Unsuccessful attempt in LUA with [place]. Vein depth and size not appropriate in L arm or RUA for peripheral IV access. Successful with small gauge catheter in R hand. Recommend considering other medication routes or access type for further treatment needs.

## 2022-03-20 NOTE — Consult Note (Signed)
ANTICOAGULATION CONSULT NOTE - Initial Consult  Pharmacy Consult for heparin Indication: pulmonary embolus  Allergies  Allergen Reactions   Cefazolin Rash    Tolerated cefepime 02/20/20 Negative penicillin skin test 12/27/2020    Patient Measurements: Height: 5\' 3"  (160 cm) Weight: 97.5 kg (215 lb) IBW/kg (Calculated) : 52.4 Heparin Dosing Weight: 75.1  Vital Signs: Temp: 98.5 F (36.9 C) (01/16 1536) BP: 122/66 (01/16 1944) Pulse Rate: 96 (01/16 1944)  Labs: Recent Labs    03/17/22 1030 03/17/22 1431 03/17/22 2330 03/18/22 0511 03/19/22 0629 03/19/22 2204 03/20/22 0055  HGB 8.6*  --   --  7.5* 7.0* 7.4*  --   HCT 28.0*  --   --  26.1* 23.1* 24.0*  --   PLT 531*  --   --  454* 439* 515*  --   APTT  --   --   --   --   --  91* 58*  HEPARINUNFRC  --   --  <0.10* 0.21*  --  >1.10*  --   CREATININE 0.81  --   --  0.75 0.69  --   --   TROPONINIHS 5 4  --   --   --   --   --      Estimated Creatinine Clearance: 110.1 mL/min (by C-G formula based on SCr of 0.69 mg/dL).   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 Admitted for bilateral PE noted on CTA chest. Patient was started on apixaban on 1/15 but pharmacy has now been consulted to transition to heparin. Per Dr. Darrall Dears, transitioning to heparin until pRBCs are available, potentially as soon as 1/17. While Hgb trending down, lowering threshold for transfusion potentially as low as <6, because due to severe IgA deficiency, there is a higher risk for allergic reaction even washed RBCs.  Date Time aPTT/HL Rate/Comment 1/17     0055    58 sec             SUBtherapeutic @ 1250  units/hr       Goal of Therapy:  aPTT 66-102 seconds Monitor platelets by anticoagulation protocol: Yes  Baseline Labs: aPTT - ordered; INR - ordered Hgb - 8.6 >> 7.5 >> 7.0; Plts - 439   Plan: 1/17:  HL @ 0055 = 58 sec, SUBtherapeutic  - Will order heparin 1100 units IV X 1 bolus and increase drip rate to 1400 units/hr. - Will check aPTT and HL 6 hrs after rate change Titrate by aPTT's until lab correlation is noted, then titrate by anti-xa alone. Continue to monitor H&H and platelets daily while on heparin gtt.

## 2022-03-20 NOTE — Progress Notes (Signed)
Date: 03/20/2022 Patient: Karen Clarke Admitted: 03/17/2022 Attending Provider: Dr. Bennetta Laos has made the decision to leave the hospital against the advice of her medical provider and understands the inherent risks, including death. Judith Demps has decided to accept the responsibility for this decision. Symphoni Helbling has signed the Leaving Against Medical Advice form prior to leaving the department.  Fuller Mandril, RN 03/20/2022

## 2022-03-21 ENCOUNTER — Telehealth: Payer: Self-pay | Admitting: Internal Medicine

## 2022-03-21 ENCOUNTER — Encounter: Payer: Self-pay | Admitting: Internal Medicine

## 2022-03-21 DIAGNOSIS — D500 Iron deficiency anemia secondary to blood loss (chronic): Secondary | ICD-10-CM

## 2022-03-21 DIAGNOSIS — I2699 Other pulmonary embolism without acute cor pulmonale: Secondary | ICD-10-CM

## 2022-03-21 DIAGNOSIS — D509 Iron deficiency anemia, unspecified: Secondary | ICD-10-CM | POA: Insufficient documentation

## 2022-03-21 LAB — TYPE AND SCREEN
ABO/RH(D): O POS
Antibody Screen: NEGATIVE
Unit division: 0
Unit division: 0

## 2022-03-21 LAB — BPAM RBC
Blood Product Expiration Date: 202401180002
Blood Product Expiration Date: 202401180023
ISSUE DATE / TIME: 202401170630
Unit Type and Rh: 5100
Unit Type and Rh: 5100

## 2022-03-21 NOTE — Telephone Encounter (Signed)
Per chat--Please schedule for hospital follow up- md visit, labs, IV venofer (first available).   then IV venofer weekly x 3   she can see APP on 1/26 with iv venofer. I will see her in 3 months with lab   Appointments made and voicemail left for patient with 1st appointment information.

## 2022-03-21 NOTE — Addendum Note (Signed)
Addended byJane Canary on: 03/21/2022 10:06 AM   Modules accepted: Orders

## 2022-03-21 NOTE — Addendum Note (Signed)
Addended byJane Canary on: 03/21/2022 08:48 AM   Modules accepted: Orders

## 2022-03-21 NOTE — Telephone Encounter (Addendum)
Pt received one dose of iv venofer inpatient.   She has severe iron deficiency.   Arrange follow up with APP on 1/26 with CBC with diff and IV venofer.  Then IV venofer weekly x 3   Follow up with Dr. Loni Muse in 3 months with labs. Will order hypercoag work up for that time.

## 2022-03-22 LAB — CULTURE, BLOOD (ROUTINE X 2)
Culture: NO GROWTH
Special Requests: ADEQUATE

## 2022-03-29 ENCOUNTER — Inpatient Hospital Stay: Payer: Medicaid Other | Admitting: Medical Oncology

## 2022-03-29 ENCOUNTER — Inpatient Hospital Stay: Payer: Medicaid Other | Attending: Internal Medicine

## 2022-03-29 ENCOUNTER — Inpatient Hospital Stay: Payer: Medicaid Other

## 2022-04-02 DIAGNOSIS — L089 Local infection of the skin and subcutaneous tissue, unspecified: Secondary | ICD-10-CM | POA: Insufficient documentation

## 2022-04-04 ENCOUNTER — Encounter: Payer: Self-pay | Admitting: Internal Medicine

## 2022-04-04 MED FILL — Iron Sucrose Inj 20 MG/ML (Fe Equiv): INTRAVENOUS | Qty: 10 | Status: AC

## 2022-04-05 ENCOUNTER — Inpatient Hospital Stay: Payer: Medicaid Other | Attending: Internal Medicine

## 2022-04-11 MED FILL — Iron Sucrose Inj 20 MG/ML (Fe Equiv): INTRAVENOUS | Qty: 10 | Status: AC

## 2022-04-12 ENCOUNTER — Inpatient Hospital Stay: Payer: Medicaid Other

## 2022-04-18 MED FILL — Iron Sucrose Inj 20 MG/ML (Fe Equiv): INTRAVENOUS | Qty: 10 | Status: AC

## 2022-04-19 ENCOUNTER — Inpatient Hospital Stay: Payer: Medicaid Other

## 2022-06-20 ENCOUNTER — Inpatient Hospital Stay: Payer: Medicaid Other | Admitting: Internal Medicine

## 2022-06-20 ENCOUNTER — Inpatient Hospital Stay: Payer: Medicaid Other

## 2022-06-27 ENCOUNTER — Inpatient Hospital Stay: Payer: Medicaid Other | Admitting: Internal Medicine

## 2022-06-27 ENCOUNTER — Inpatient Hospital Stay: Payer: Medicaid Other | Attending: Internal Medicine

## 2022-06-27 ENCOUNTER — Encounter: Payer: Self-pay | Admitting: Internal Medicine

## 2022-09-19 ENCOUNTER — Encounter: Payer: Self-pay | Admitting: Internal Medicine

## 2022-10-14 ENCOUNTER — Encounter: Payer: Self-pay | Admitting: Nurse Practitioner

## 2022-10-14 ENCOUNTER — Other Ambulatory Visit: Payer: Self-pay

## 2022-10-14 ENCOUNTER — Ambulatory Visit (INDEPENDENT_AMBULATORY_CARE_PROVIDER_SITE_OTHER): Payer: MEDICAID | Admitting: Nurse Practitioner

## 2022-10-14 VITALS — BP 130/80 | HR 98 | Temp 98.2°F | Resp 16 | Ht 63.0 in | Wt 196.8 lb

## 2022-10-14 DIAGNOSIS — R197 Diarrhea, unspecified: Secondary | ICD-10-CM

## 2022-10-14 DIAGNOSIS — Z13220 Encounter for screening for lipoid disorders: Secondary | ICD-10-CM

## 2022-10-14 DIAGNOSIS — IMO0002 Reserved for concepts with insufficient information to code with codable children: Secondary | ICD-10-CM | POA: Insufficient documentation

## 2022-10-14 DIAGNOSIS — M329 Systemic lupus erythematosus, unspecified: Secondary | ICD-10-CM

## 2022-10-14 DIAGNOSIS — S81802A Unspecified open wound, left lower leg, initial encounter: Secondary | ICD-10-CM | POA: Diagnosis not present

## 2022-10-14 DIAGNOSIS — F603 Borderline personality disorder: Secondary | ICD-10-CM

## 2022-10-14 DIAGNOSIS — G629 Polyneuropathy, unspecified: Secondary | ICD-10-CM

## 2022-10-14 DIAGNOSIS — G932 Benign intracranial hypertension: Secondary | ICD-10-CM

## 2022-10-14 DIAGNOSIS — R1084 Generalized abdominal pain: Secondary | ICD-10-CM

## 2022-10-14 DIAGNOSIS — R630 Anorexia: Secondary | ICD-10-CM

## 2022-10-14 DIAGNOSIS — Z131 Encounter for screening for diabetes mellitus: Secondary | ICD-10-CM

## 2022-10-14 DIAGNOSIS — F4310 Post-traumatic stress disorder, unspecified: Secondary | ICD-10-CM | POA: Diagnosis not present

## 2022-10-14 DIAGNOSIS — F1990 Other psychoactive substance use, unspecified, uncomplicated: Secondary | ICD-10-CM

## 2022-10-14 DIAGNOSIS — Z86711 Personal history of pulmonary embolism: Secondary | ICD-10-CM

## 2022-10-14 DIAGNOSIS — Z130 Encounter for screening for diseases of the blood and blood-forming organs and certain disorders involving the immune mechanism: Secondary | ICD-10-CM

## 2022-10-14 DIAGNOSIS — D508 Other iron deficiency anemias: Secondary | ICD-10-CM

## 2022-10-14 DIAGNOSIS — R110 Nausea: Secondary | ICD-10-CM

## 2022-10-14 MED ORDER — DOXYCYCLINE HYCLATE 100 MG PO TABS
100.0000 mg | ORAL_TABLET | Freq: Two times a day (BID) | ORAL | 0 refills | Status: AC
Start: 2022-10-14 — End: 2022-10-21

## 2022-10-14 MED ORDER — QUETIAPINE FUMARATE 100 MG PO TABS
100.0000 mg | ORAL_TABLET | Freq: Every day | ORAL | 1 refills | Status: DC
Start: 2022-10-14 — End: 2023-01-17

## 2022-10-14 MED ORDER — GABAPENTIN 100 MG PO CAPS
100.0000 mg | ORAL_CAPSULE | Freq: Three times a day (TID) | ORAL | 3 refills | Status: AC
Start: 2022-10-14 — End: ?

## 2022-10-14 MED ORDER — ONDANSETRON 4 MG PO TBDP
4.0000 mg | ORAL_TABLET | Freq: Three times a day (TID) | ORAL | 0 refills | Status: DC | PRN
Start: 2022-10-14 — End: 2022-11-05

## 2022-10-14 NOTE — Assessment & Plan Note (Signed)
Referral placed to wound care clinic. Start doxycycline.  DME order placed for wound care items.

## 2022-10-14 NOTE — Assessment & Plan Note (Signed)
Referral placed to psychiatry, refilled seroquel for patient .

## 2022-10-14 NOTE — Assessment & Plan Note (Signed)
Getting labs today, will place referral to hematology.

## 2022-10-14 NOTE — Assessment & Plan Note (Signed)
Patient reports she has been clean going on 4 years. She is currently going to the methadone clinic.

## 2022-10-14 NOTE — Assessment & Plan Note (Signed)
Patient reports she has a lot of nerve pain and has previously taken gabapentin and would like to take it again.

## 2022-10-14 NOTE — Progress Notes (Signed)
BP 130/80   Pulse 98   Temp 98.2 F (36.8 C) (Oral)   Resp 16   Ht 5\' 3"  (1.6 m)   Wt 196 lb 12.8 oz (89.3 kg)   SpO2 97%   BMI 34.86 kg/m    Subjective:    Patient ID: Karen Clarke, female    DOB: [birth year], 37 y.o.   MRN: [id number]  HPI: Karen Clarke is a 37 y.o. female  Chief Complaint  Patient presents with   Establish Care   Referral    Rheumatology for Lupus, Psychiatry   Abdominal Pain    Referral Gastroenterologist, vomiting, nausea, no appetite , diarrhea, fatigue sleeping and sweating had tumor removed 3 years again   Wound Check    Wound care referral for left leg wound   Tumor    Neurology referral    Establish care: her last physical was years ago.  Medical history includes listed below.    Health maintenance due for labs,pap.   Lupus: She says she was diagnosed when she was 13.  She reports she used to be on steroids for flares.  She would like referral to rheumatology.  Referral placed.    pseudotumor cerebri:  she was diagnosed when she was 47.  She says she has had 2 spinal taps to drain the fluid.  She says it has been a long time since she had to have it drained. She would like to establish with neurology.    Leg wound: she has had this wound on her left leg for awhile.  She says that any trauma can make it worse.  She has previously been in wound care.  She says she has had this for years.  Will place referral to wound care.  She has previously been on gabapentin, she has been off of it for about three months ago.she says the nerve pain is hard with dressing changes. Will refill gabapentin, course of antibiotics and refer to wound care.    PTSD/borderline personality disorder: she was previously on Seroquel. She says she has been out about 3-4 days ago. Seroquel 100 mg at bedtime.  Will refill.  She says she does not tolerate depression medications.  referral placed to psychiatry    10/14/2022    2:46 PM  GAD 7 : Generalized Anxiety Score  Nervous,  Anxious, on Edge 1  Control/stop worrying 1  Worry too much - different things 1  Trouble relaxing 1  Restless 0  Easily annoyed or irritable 1  Afraid - awful might happen 1  Total GAD 7 Score 6  Anxiety Difficulty Somewhat difficult      10/14/2022    2:45 PM  Depression screen PHQ 2/9  Decreased Interest 1  Down, Depressed, Hopeless 1  PHQ - 2 Score 2  Altered sleeping 2  Tired, decreased energy 1  Change in appetite 1  Feeling bad or failure about yourself  1  Trouble concentrating 1  Moving slowly or fidgety/restless 2  Suicidal thoughts 0  PHQ-9 Score 10  Difficult doing work/chores Somewhat difficult    Abdominal pain/loss of appetite/diarrhea/nausea : previously had a colonoscopy and had a tumor removed three years ago. She would like to see a different GI.  She says that she has been experiencing this over the last month. She reports the pain feels sharp/pressure,  comes and goes.  She reports weight loss, vomiting and diarrhea.   Referral placed.  Will also get labs.  [facility]: current  History iv  drug user/ currently on methadone:  she is going to a methadone clinic. She is going on 4 years clean.    Iron deficiency anemia/ history of pe: patient was admitted for pe back in January.  She is not currently on blood thinners. She did leave the hospital ama.   She has a history of iron deficiency anemia.  Will place referral to hematology and get labs.    Relevant past medical, surgical, family and social history reviewed and updated as indicated. Interim medical history since our last visit reviewed. Allergies and medications reviewed and updated.  Review of Systems  Constitutional: Negative for fever , positive for weight change.  Respiratory: Negative for cough and shortness of breath.   Cardiovascular: Negative for chest pain or palpitations.  Gastrointestinal: positive for abdominal pain, diarrhea Musculoskeletal: Negative for gait problem or joint swelling.  Skin:  Negative for rash. Positive for wound on left lower leg Neurological: Negative for dizziness or headache.  No other specific complaints in a complete review of systems (except as listed in HPI above).      Objective:    BP 130/80   Pulse 98   Temp 98.2 F (36.8 C) (Oral)   Resp 16   Ht 5\' 3"  (1.6 m)   Wt 196 lb 12.8 oz (89.3 kg)   SpO2 97%   BMI 34.86 kg/m   Wt Readings from Last 3 Encounters:  10/14/22 196 lb 12.8 oz (89.3 kg)  03/17/22 215 lb (97.5 kg)  07/11/21 220 lb (99.8 kg)    Physical Exam  Constitutional: Patient appears well-developed and well-nourished. Obese  No distress.  HEENT: head atraumatic, normocephalic, pupils equal and reactive to light, neck supple, throat within normal limits Cardiovascular: Normal rate, regular rhythm and normal heart sounds.  No murmur heard. No BLE edema. Pulmonary/Chest: Effort normal and breath sounds normal. No respiratory distress. Abdominal: Soft.  Generalized tenderness Skin: left lower leg large ulceration Psychiatric: Patient has a normal mood and affect. behavior is normal. Judgment and thought content normal.      Assessment & Plan:   Problem List Items Addressed This Visit       Nervous and Auditory   Pseudotumor cerebri    Referral to neurology placed.  Patient reports she has been doing well but wants to establish care.       Relevant Orders   Ambulatory referral to Neurology   Neuropathy    Patient reports she has a lot of nerve pain and has previously taken gabapentin and would like to take it again.       Relevant Medications   gabapentin (NEURONTIN) 100 MG capsule     Other   IVDU (intravenous drug user)    Patient reports she has been clean going on 4 years. She is currently going to the methadone clinic.       History of pulmonary embolism    will place referral to hematology.       Iron deficiency anemia    Getting labs today, will place referral to hematology.       Relevant Orders   CBC  with Differential/Platelet   Iron, TIBC and Ferritin Panel   PTSD (post-traumatic stress disorder) - Primary    Referral placed to psychiatry, refilled seroquel for patient .        Relevant Medications   QUEtiapine (SEROQUEL) 100 MG tablet   Other Relevant Orders   Ambulatory referral to Psychiatry   Borderline personality disorder (HCC)  Referral placed to psychiatry, refilled seroquel for patient .        Relevant Medications   QUEtiapine (SEROQUEL) 100 MG tablet   Other Relevant Orders   Ambulatory referral to Psychiatry   Lupus [facility])   Relevant Orders   Ambulatory referral to Rheumatology   Wound of left leg    Referral placed to wound care clinic. Start doxycycline.  DME order placed for wound care items.      Relevant Medications   doxycycline (VIBRA-TABS) 100 MG tablet   Other Relevant Orders   Ambulatory referral to Wound Clinic   For home use only DME Other see comment   Other Visit Diagnoses     Generalized abdominal pain       getting labs, stool culture and referral to gi   Relevant Orders   Ambulatory referral to Gastroenterology   CBC with Differential/Platelet   COMPLETE METABOLIC PANEL WITH GFR   Lipase   Screening for diabetes mellitus       Relevant Orders   COMPLETE METABOLIC PANEL WITH GFR   Hemoglobin A1c   Screening for deficiency anemia       Relevant Orders   CBC with Differential/Platelet   Iron, TIBC and Ferritin Panel   Screening for cholesterol level       Relevant Orders   Lipid panel   Loss of appetite       getting labs, stool culture and referral to gi   Relevant Orders   CBC with Differential/Platelet   COMPLETE METABOLIC PANEL WITH GFR   Lipase   TSH   Diarrhea, unspecified type       getting labs, stool culture and referral to gi   Relevant Orders   Ova and parasite examination   Salmonella/Shigella Cult, Campy EIA and Shiga Toxin reflex   Clostridium difficile culture-fecal   Stool Giardia/Cryptosporidium   Nausea        getting labs, stool culture and referral to gi   Relevant Medications   ondansetron (ZOFRAN-ODT) 4 MG disintegrating tablet        Follow up plan: Return in about 4 months (around 02/13/2023) for follow up.

## 2022-10-14 NOTE — Assessment & Plan Note (Signed)
will place referral to hematology.

## 2022-10-14 NOTE — Assessment & Plan Note (Signed)
Referral to neurology placed.  Patient reports she has been doing well but wants to establish care.

## 2022-10-15 ENCOUNTER — Encounter: Payer: Self-pay | Admitting: Internal Medicine

## 2022-10-15 ENCOUNTER — Encounter: Payer: Self-pay | Admitting: Emergency Medicine

## 2022-11-01 ENCOUNTER — Ambulatory Visit: Payer: Self-pay

## 2022-11-01 ENCOUNTER — Ambulatory Visit: Payer: MEDICAID | Admitting: Physician Assistant

## 2022-11-01 NOTE — Telephone Encounter (Signed)
     Chief Complaint: Pain starts in back and radiates into chest. Seen in ED yesterday "and they said my heart is fine and did a lot of tests." "I just want get seen, I do feel better today." Karen Clarke in the practice states to schedule pt. At [facility] if pt. Wants to be seen today. Symptoms: Above Frequency: 4 days ago Pertinent Negatives: Patient denies  Disposition: [] ED /[] Urgent Care (no appt availability in office) / [x] Appointment(In office/virtual)/ []  [place] [facility]/ [] Home Care/ [] Refused Recommended Disposition /[] [place] [facility]/ []  Follow-up with PCP Additional Notes: Pt. Agrees with appointment today.  Reason for Disposition  [1] Chest pain(s) lasting a few seconds AND [2] persists > 3 days  Answer Assessment - Initial Assessment Questions 1. LOCATION: "Where does it hurt?"       Left side of chest 2. RADIATION: "Does the pain go anywhere else?" (e.g., into neck, jaw, arms, back)     Starts in back and comes around to chest 3. ONSET: "When did the chest pain begin?" (Minutes, hours or days)      4 days ago 4. PATTERN: "Does the pain come and go, or has it been constant since it started?"  "Does it get worse with exertion?"      Better today, comes and goes 5. DURATION: "How long does it last" (e.g., seconds, minutes, hours)     Unsure 6. SEVERITY: "How bad is the pain?"  (e.g., Scale 1-10; mild, moderate, or severe)    - MILD (1-3): doesn't interfere with normal activities     - MODERATE (4-7): interferes with normal activities or awakens from sleep    - SEVERE (8-10): excruciating pain, unable to do any normal activities       Now - 5 7. CARDIAC RISK FACTORS: "Do you have any history of heart problems or risk factors for heart disease?" (e.g., angina, prior heart attack; diabetes, high blood pressure, high cholesterol, smoker, or strong family history of heart disease)     Yes 8. PULMONARY RISK FACTORS: "Do you have any history of lung disease?"   (e.g., blood clots in lung, asthma, emphysema, birth control pills)     No 9. CAUSE: "What do you think is causing the chest pain?"     Unsure 10. OTHER SYMPTOMS: "Do you have any other symptoms?" (e.g., dizziness, nausea, vomiting, sweating, fever, difficulty breathing, cough)       Had vomiting 11. PREGNANCY: "Is there any chance you are pregnant?" "When was your last menstrual period?"       No  Protocols used: Chest Pain-A-AH

## 2022-11-05 ENCOUNTER — Other Ambulatory Visit: Payer: Self-pay

## 2022-11-05 ENCOUNTER — Ambulatory Visit: Payer: Self-pay

## 2022-11-05 ENCOUNTER — Encounter: Payer: Self-pay | Admitting: Internal Medicine

## 2022-11-05 ENCOUNTER — Encounter: Payer: Self-pay | Admitting: Family Medicine

## 2022-11-05 ENCOUNTER — Ambulatory Visit: Payer: MEDICAID | Admitting: Family Medicine

## 2022-11-05 VITALS — BP 124/80 | HR 94 | Temp 98.4°F | Resp 28 | Ht 63.0 in | Wt 199.2 lb

## 2022-11-05 DIAGNOSIS — R32 Unspecified urinary incontinence: Secondary | ICD-10-CM

## 2022-11-05 DIAGNOSIS — Z09 Encounter for follow-up examination after completed treatment for conditions other than malignant neoplasm: Secondary | ICD-10-CM

## 2022-11-05 DIAGNOSIS — R112 Nausea with vomiting, unspecified: Secondary | ICD-10-CM

## 2022-11-05 DIAGNOSIS — R0602 Shortness of breath: Secondary | ICD-10-CM

## 2022-11-05 DIAGNOSIS — R1013 Epigastric pain: Secondary | ICD-10-CM

## 2022-11-05 DIAGNOSIS — R079 Chest pain, unspecified: Secondary | ICD-10-CM

## 2022-11-05 LAB — POCT URINALYSIS DIPSTICK
Bilirubin, UA: NEGATIVE
Glucose, UA: NEGATIVE
Ketones, UA: NEGATIVE
Nitrite, UA: NEGATIVE
Protein, UA: NEGATIVE
Spec Grav, UA: 1.02 (ref 1.010–1.025)
Urobilinogen, UA: 0.2 U/dL
pH, UA: 6 (ref 5.0–8.0)

## 2022-11-05 MED ORDER — ONDANSETRON 4 MG PO TBDP: 4.0000 mg | ORAL_TABLET | Freq: Three times a day (TID) | ORAL | 1 refills | Status: AC | PRN

## 2022-11-05 MED ORDER — PANTOPRAZOLE SODIUM 40 MG PO TBEC
40.0000 mg | DELAYED_RELEASE_TABLET | Freq: Two times a day (BID) | ORAL | 1 refills | Status: DC
Start: 2022-11-05 — End: 2022-11-05
  Filled 2022-11-05: qty 60, 30d supply, fill #0

## 2022-11-05 MED ORDER — ONDANSETRON 4 MG PO TBDP
4.0000 mg | ORAL_TABLET | Freq: Three times a day (TID) | ORAL | 1 refills | Status: DC | PRN
Start: 2022-11-05 — End: 2022-11-05
  Filled 2022-11-05: qty 20, 4d supply, fill #0

## 2022-11-05 MED ORDER — SUCRALFATE 1 G PO TABS
1.0000 g | ORAL_TABLET | Freq: Three times a day (TID) | ORAL | 1 refills | Status: DC | PRN
Start: 2022-11-05 — End: 2022-11-05
  Filled 2022-11-05 (×2): qty 30, 10d supply, fill #0

## 2022-11-05 MED ORDER — SUCRALFATE 1 G PO TABS: 1.0000 g | ORAL_TABLET | Freq: Three times a day (TID) | ORAL | 1 refills | Status: DC | PRN

## 2022-11-05 MED ORDER — PANTOPRAZOLE SODIUM 40 MG PO TBEC: 40.0000 mg | DELAYED_RELEASE_TABLET | Freq: Two times a day (BID) | ORAL | 1 refills | Status: AC

## 2022-11-05 MED ORDER — ALBUTEROL SULFATE HFA 108 (90 BASE) MCG/ACT IN AERS: 2.0000 | INHALATION_SPRAY | Freq: Four times a day (QID) | RESPIRATORY_TRACT | 0 refills | Status: DC | PRN

## 2022-11-05 MED ORDER — ALBUTEROL SULFATE HFA 108 (90 BASE) MCG/ACT IN AERS
2.0000 | INHALATION_SPRAY | Freq: Four times a day (QID) | RESPIRATORY_TRACT | 0 refills | Status: DC | PRN
Start: 2022-11-05 — End: 2022-11-05
  Filled 2022-11-05: qty 6.7, 25d supply, fill #0

## 2022-11-05 NOTE — Telephone Encounter (Signed)
Sent per pt prefernece, called her no answer unable to leave vm due to full mailbox.

## 2022-11-05 NOTE — Telephone Encounter (Signed)
  Chief Complaint: Chest pain, lightheaded, cannot take a deep breath, low fever - while taking Tylenol, shoulder blade pain, discomfort when swallowing. Symptoms: above Frequency: before 8/29 Pertinent Negatives: Patient denies  Disposition: [] ED /[] Urgent Care (no appt availability in office) / [x] Appointment(In office/virtual)/ []  [place] [facility]/ [] Home Care/ [] Refused Recommended Disposition /[] [place] [facility]/ []  Follow-up with PCP Additional Notes: Pt has had these s/s for several days. Pt was seen at ED and released.     Reason for Disposition  [1] Chest pain lasts > 5 minutes AND [2] occurred > 3 days ago (72 hours) AND [3] NO chest pain or cardiac symptoms now  Answer Assessment - Initial Assessment Questions Pt is calling with same s/s as 8/30 and 10/31/2022. Pt was seen at ED and was triaged on 8/30.  Protocols used: Chest Pain-A-AH

## 2022-11-05 NOTE — Progress Notes (Signed)
Patient ID: Karen Clarke, female    DOB: Mar 17, 1985, 37 y.o.   MRN: [id number]  PCP: Berniece Salines, FNP  Chief Complaint  Patient presents with   Chest Pain   Dizziness    Pt states has seen at ER sx still the same. Pt refused to lay down due to her chest pain for orthostatics vitals.   Shortness of Breath    Subjective:   Karen Clarke is a 37 y.o. female, presents to clinic with CC of the following:  HPI  PT presents with pleuritic CP, SOB and lightheadedness, she wanted to be seen in office with ongoing sx, she was seen 5 d ago in [facility] ED Shortness of BreathPt reports having extensive medical history and was seen at [facility], states had collapsed lung was septic, lupus, PE, anemia. Having headaches, SOB, CP mostly on inhalation. Pt also c/o lower back pain that radiates to the left shoulder and chest. +n/v. Pt has been taking doxy for pyoderma gangrenosum.  CTA done unremarkable Labs pertinent for anemia, last H/H in our system was 7.4/24/0, [facility] 8.5/27  Here today with still feeling pain to upper abd, chest radiates to back and shoulder blade, N/V difficulty eating bigger meals she has been able to tolerate small things like a banana   Patient Active Problem List   Diagnosis Date Noted   PTSD (post-traumatic stress disorder) 10/14/2022   Borderline personality disorder (HCC) 10/14/2022   Lupus (HCC) 10/14/2022   Wound of left leg 10/14/2022   Neuropathy 10/14/2022   Iron deficiency anemia 03/21/2022   History of pulmonary embolism 03/17/2022   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   Cellulitis of left breast 04/06/2021   Cellulitis of right breast 04/03/2021   Sepsis due to cellulitis (HCC) 03/01/2021   Bacteremia 02/08/2021   Cellulitis of right lower extremity 02/07/2021   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   Polysubstance abuse (HCC) 06/21/2018   Pseudotumor cerebri 06/21/2018   Tobacco abuse 06/21/2018   IVDU (intravenous drug user)  06/16/2018   Positive hepatitis C antibody test 03/27/2016      Current Outpatient Medications:    acetaminophen (TYLENOL) 325 MG tablet, Take 2 tablets (650 mg total) by mouth every 6 (six) hours as needed for mild pain (or Fever >/= 101)., Disp: , Rfl:    gabapentin (NEURONTIN) 100 MG capsule, Take 1 capsule (100 mg total) by mouth 3 (three) times daily., Disp: 90 capsule, Rfl: 3   ibuprofen (ADVIL) 400 MG tablet, Take 2 tablets (800 mg total) by mouth every 8 (eight) hours as needed for moderate pain., Disp: , Rfl:    melatonin 3 MG TABS tablet, Take 3 mg by mouth at bedtime., Disp: , Rfl:    methadone (DOLOPHINE) 10 MG/ML solution, Take 110 mg by mouth daily., Disp: , Rfl:    nitrofurantoin, macrocrystal-monohydrate, (MACROBID) 100 MG capsule, Take by mouth., Disp: , Rfl:    ondansetron (ZOFRAN-ODT) 4 MG disintegrating tablet, Take 1 tablet (4 mg total) by mouth every 8 (eight) hours as needed for nausea or vomiting., Disp: 30 tablet, Rfl: 0   phenazopyridine (PYRIDIUM) 200 MG tablet, Take by mouth., Disp: , Rfl:    QUEtiapine (SEROQUEL) 100 MG tablet, Take 1 tablet (100 mg total) by mouth at bedtime., Disp: 90 tablet, Rfl: 1   Allergies  Allergen Reactions   Cefazolin Rash    Tolerated cefepime 02/20/20 Negative penicillin skin test 12/27/2020     Social History   Tobacco Use  Smoking status: Every Day    Types: Cigarettes    Passive exposure: Current   Smokeless tobacco: Never  Vaping Use   Vaping status: Former   Substances: Nicotine, Flavoring  Substance Use Topics   Alcohol use: Never   Drug use: Yes    Types: IV, Cocaine, Opium    Comment: clean for 3 years      Chart Review Today: I personally reviewed active problem list, medication list, allergies, family history, social history, health maintenance, notes from last encounter, lab results, imaging with the patient/caregiver today.   Review of Systems  Constitutional: Negative.   HENT: Negative.    Eyes:  Negative.   Respiratory: Negative.    Cardiovascular: Negative.   Gastrointestinal: Negative.   Endocrine: Negative.   Genitourinary: Negative.   Musculoskeletal: Negative.   Skin: Negative.   Allergic/Immunologic: Negative.   Neurological: Negative.   Hematological: Negative.   Psychiatric/Behavioral: Negative.    All other systems reviewed and are negative.      Objective:   Vitals:   11/05/22 0903 11/05/22 0909  BP: 124/80   Pulse: (!) 106 94  Temp: 98.4 F (36.9 C)   TempSrc: Oral   SpO2: 98%   Weight: 199 lb 3.2 oz (90.4 kg)   Height: 5\' 3"  (1.6 m)     Body mass index is 35.29 kg/m.  Physical Exam Vitals and nursing note reviewed.  Constitutional:      General: She is not in acute distress.    Appearance: She is well-developed. She is not ill-appearing, toxic-appearing or diaphoretic.  HENT:     Head: Normocephalic and atraumatic.     Nose: Nose normal.  Eyes:     General:        Right eye: No discharge.        Left eye: No discharge.     Conjunctiva/sclera: Conjunctivae normal.  Neck:     Trachea: No tracheal deviation.  Cardiovascular:     Rate and Rhythm: Normal rate and regular rhythm.     Pulses: Normal pulses.     Heart sounds: Normal heart sounds.  Pulmonary:     Effort: Pulmonary effort is normal. No respiratory distress.     Breath sounds: Normal breath sounds. No stridor. No wheezing, rhonchi or rales.  Musculoskeletal:        General: Normal range of motion.  Skin:    General: Skin is warm and dry.     Findings: No rash.  Neurological:     Mental Status: She is alert.     Motor: No abnormal muscle tone.     Coordination: Coordination normal.  Psychiatric:        Behavior: Behavior normal.            Assessment & Plan:   1. Nausea and vomiting, unspecified vomiting type Pt continues to have N, did not get zofran, refill ordered  2. Epigastric pain Encouraged increasing tx with PPI BID, pepcid OTC BID, carafate  3. Chest  pain, unspecified type Likely non-cardiac/GI etiology Reviewed thoroughly with pt the ED/hospital findings/labs   4. Urinary incontinence, unspecified type POCT urinalysis dipstick  Result Value Ref Range   Color, UA Yellow    Clarity, UA Cloudy    Glucose, UA Negative Negative   Bilirubin, UA Negative    Ketones, UA Negative    Spec Grav, UA 1.020 1.010 - 1.025   Blood, UA Large    pH, UA 6.0 5.0 - 8.0   Protein, UA  Negative Negative   Urobilinogen, UA 0.2 0.2 or 1.0 E.U./dL   Nitrite, UA Negative    Leukocytes, UA Moderate (2+) (A) Negative   Appearance Yellow    Odor Foul   UA reviewed, no sig suprapubic ttp on exam, no CVA tenderness b/l Would not recommend adding abx unless urinary sx continue with positive culture - she just finished abx as well Will wait for culture results  - Urine Culture - POCT urinalysis dipstick  5. SOB (shortness of breath) Trial of inhaler   6. Encounter for examination following treatment at hospital Reviewed encounter, results imaging, meds, VS all with the pt today   Return for 2 week f/up chest/abd pain sx.      Danelle Berry, PA-C 11/05/22 9:21 AM

## 2022-11-05 NOTE — Telephone Encounter (Signed)
Called [facility] to cancel prescriptions

## 2022-11-05 NOTE — Telephone Encounter (Signed)
Called patient to check on her, mailbox full cannot leave a message

## 2022-11-05 NOTE — Telephone Encounter (Signed)
Pt would like for her meds to go to [facility] [place] not [facility]

## 2022-11-05 NOTE — Patient Instructions (Addendum)
Stop taking nsaids/ibuprofen  We will call you with the urine culture results, but for now I recommend not taking any antibiotics You can take tylenol for discomfort, try carafate for pain, start taking protonix/pantoprazole twice a day, this should help with pain but may take a few days or a week to start working, you can also try pepcid or famotidine (20 mg twice a day as needed)  We will recheck you in 1-2 weeks  Try the inhaler for chest tightness or shortness of breath.

## 2022-11-06 ENCOUNTER — Telehealth: Payer: Self-pay | Admitting: Nurse Practitioner

## 2022-11-06 LAB — URINE CULTURE
MICRO NUMBER:: 15414336
Result:: NO GROWTH
SPECIMEN QUALITY:: ADEQUATE

## 2022-11-06 NOTE — Telephone Encounter (Signed)
Copied from CRM [id number]. Topic: General - Other >> Nov 06, 2022  3:26 PM Karen Clarke wrote: Reason for CRM: Pt requests call back to go over results of the urinalysis. Cb# [phone]

## 2022-11-08 NOTE — Telephone Encounter (Signed)
Called - left message

## 2022-11-11 NOTE — Telephone Encounter (Signed)
Copied from CRM [id number]. Topic: General - Call Back - No Documentation >> Nov 11, 2022 10:59 AM Macon Large wrote: Reason for CRM: Pt stated that she had 3 missed calls from the office today so she was  returning the call. Cb# [phone]

## 2022-11-11 NOTE — Telephone Encounter (Signed)
Called mailbox full cannot leave a message.

## 2022-11-12 ENCOUNTER — Encounter: Payer: Self-pay | Admitting: Internal Medicine

## 2022-11-22 ENCOUNTER — Ambulatory Visit (INDEPENDENT_AMBULATORY_CARE_PROVIDER_SITE_OTHER): Payer: MEDICAID | Admitting: Nurse Practitioner

## 2022-11-22 ENCOUNTER — Other Ambulatory Visit: Payer: Self-pay

## 2022-11-22 ENCOUNTER — Encounter: Payer: Self-pay | Admitting: Nurse Practitioner

## 2022-11-22 VITALS — BP 120/78 | HR 95 | Temp 97.9°F | Resp 16 | Ht 63.0 in | Wt 205.2 lb

## 2022-11-22 DIAGNOSIS — G932 Benign intracranial hypertension: Secondary | ICD-10-CM | POA: Diagnosis not present

## 2022-11-22 DIAGNOSIS — R630 Anorexia: Secondary | ICD-10-CM

## 2022-11-22 DIAGNOSIS — R110 Nausea: Secondary | ICD-10-CM

## 2022-11-22 DIAGNOSIS — M329 Systemic lupus erythematosus, unspecified: Secondary | ICD-10-CM | POA: Diagnosis not present

## 2022-11-22 DIAGNOSIS — IMO0002 Reserved for concepts with insufficient information to code with codable children: Secondary | ICD-10-CM

## 2022-11-22 DIAGNOSIS — S81802A Unspecified open wound, left lower leg, initial encounter: Secondary | ICD-10-CM

## 2022-11-22 DIAGNOSIS — R0602 Shortness of breath: Secondary | ICD-10-CM

## 2022-11-22 DIAGNOSIS — F603 Borderline personality disorder: Secondary | ICD-10-CM

## 2022-11-22 DIAGNOSIS — Z86711 Personal history of pulmonary embolism: Secondary | ICD-10-CM

## 2022-11-22 DIAGNOSIS — R079 Chest pain, unspecified: Secondary | ICD-10-CM

## 2022-11-22 DIAGNOSIS — D508 Other iron deficiency anemias: Secondary | ICD-10-CM

## 2022-11-22 DIAGNOSIS — S81802S Unspecified open wound, left lower leg, sequela: Secondary | ICD-10-CM

## 2022-11-22 DIAGNOSIS — F4310 Post-traumatic stress disorder, unspecified: Secondary | ICD-10-CM

## 2022-11-22 DIAGNOSIS — R768 Other specified abnormal immunological findings in serum: Secondary | ICD-10-CM

## 2022-11-22 DIAGNOSIS — R197 Diarrhea, unspecified: Secondary | ICD-10-CM

## 2022-11-22 DIAGNOSIS — R1084 Generalized abdominal pain: Secondary | ICD-10-CM | POA: Diagnosis not present

## 2022-11-22 DIAGNOSIS — R7689 Other specified abnormal immunological findings in serum: Secondary | ICD-10-CM

## 2022-11-22 DIAGNOSIS — F1990 Other psychoactive substance use, unspecified, uncomplicated: Secondary | ICD-10-CM

## 2022-11-22 NOTE — Progress Notes (Signed)
BP 120/78   Pulse 95   Temp 97.9 F (36.6 C) (Oral)   Resp 16   Ht 5\' 3"  (1.6 m)   Wt 205 lb 3.2 oz (93.1 kg)   SpO2 99%   BMI 36.35 kg/m    Subjective:    Patient ID: Karen Clarke, female    DOB: 06-07-1985, 37 y.o.   MRN: [id number]  HPI: Karen Clarke is a 37 y.o. female  Chief Complaint  Patient presents with   Medical Management of Chronic Issues    Lupus: She says she was diagnosed when she was 13.  She reports she used to be on steroids for flares.  She would like referral to rheumatology.  Referral placed at last appointment.  She has not been able to get in with them because she was unable to get her records from her previous provider. Will recheck labs and place new referral.   pseudotumor cerebri:  she was diagnosed when she was 13.  She says she has had 2 spinal taps to drain the fluid.  She says it has been a long time since she had to have it drained. She would like to establish with neurology.  Referral was placed to neurology, patient did not respond when they called to schedule,  will resubmit referral and give patient number to all and schedule.   Leg wound: she has had this wound on her left leg for awhile.  She says that any trauma can make it worse.  She has previously been in wound care.  She says she has had this for years.  Will place referral to wound care.  She has previously been on gabapentin, she has been off of it for about three months ago.she says the nerve pain is hard with dressing changes. Last appointment refilled gabapentin, started antibiotics and referral placed to wound care. Patient never went to the wound clinic. Patient reports that she has gotten in touch with them.  Has appointment with wound care 11/27/2022.  PTSD/borderline personality disorder: she was previously on Seroquel. She says she has been out about 3-4 days ago. Seroquel 100 mg at bedtime.  Refill was sent.  She says she does not tolerate depression medications.  referral placed to  psychiatry last visit.  Will give her number to call and schedule.      11/22/2022    2:18 PM 11/05/2022    9:08 AM 10/14/2022    2:46 PM  GAD 7 : Generalized Anxiety Score  Nervous, Anxious, on Edge 1 3 1   Control/stop worrying 3 3 1   Worry too much - different things 3 3 1   Trouble relaxing 3 3 1   Restless 0 3 0  Easily annoyed or irritable 1 1 1   Afraid - awful might happen 0 3 1  Total GAD 7 Score 11 19 6   Anxiety Difficulty Somewhat difficult Extremely difficult Somewhat difficult      11/22/2022    2:16 PM 11/05/2022    9:03 AM 10/14/2022    2:45 PM  Depression screen PHQ 2/9  Decreased Interest 3 3 1   Down, Depressed, Hopeless 3 3 1   PHQ - 2 Score 6 6 2   Altered sleeping 1 3 2   Tired, decreased energy 3 3 1   Change in appetite 3 0 1  Feeling bad or failure about yourself  2 0 1  Trouble concentrating 3 3 1   Moving slowly or fidgety/restless 0 0 2  Suicidal thoughts 0 0 0  PHQ-9 Score  18 15 10   Difficult doing work/chores Somewhat difficult Extremely dIfficult Somewhat difficult    Abdominal pain/loss of appetite/diarrhea/nausea/chest pain : previously had a colonoscopy and had a tumor removed three years ago. She would like to see a different GI.  She says that she has been experiencing this over the last month. She reports the abdominal pain feels sharp/pressure,  comes and goes.  She reports weight loss, vomiting and diarrhea.   Labs were ordered but for some reason never done.  Referral was placed to GI, however patient never responded to their attempts to reach her to schedule appointment. Patient was seen in the ER on 10/31/2022 for multiple complaints including: chest pain, shorntess of breath, lightheadedness, abdominal pain. Er note: Pt reports having extensive medical history and was seen at [facility], states had collapsed lung was septic, lupus, PE, anemia. Having headaches, SOB, CP mostly on inhalation. Pt also c/o lower back pain that radiates to the left shoulder and  chest. +n/v. Pt has been taking doxy for pyoderma gangrenosum. Work up included labs, EKG, ct chest.  Workup was reassuring.  Patient was treated for a UTI. Patient reported that she completed the antibiotic. She was then seen in this office by Danelle Berry PA.  Her note: PT presents with pleuritic CP, SOB and lightheadedness, she wanted to be seen in office with ongoing sx, she was seen 5 d ago in [facility] ED Shortness of BreathPt reports having extensive medical history and was seen at [facility], states had collapsed lung was septic, lupus, PE, anemia. Having headaches, SOB, CP mostly on inhalation. Pt also c/o lower back pain that radiates to the left shoulder and chest. +n/v. Pt has been taking doxy for pyoderma gangrenosum.  CTA done unremarkable CTA CHEST, PE PROTOCOL:  HISTORY: Shortness of breath  COMPARISON: None  INTRAVENOUS CONTRAST: 100 mL of Omnipaque 350.  TECHNIQUE: Postcontrast angiographic images were obtained of the chest. 3-D images (volume rendered, surface rendered, and/or MIP images) were produced, and stored on PACS.CT source data was analyzed using artificial intelligence software and reviewed by the radiologist.  DICOM format image data available to non-affiliated external healthcare facilities or entities on a secure, media free, reciprocally searchable basis with patient authorization for at least a 12 month period after the study.  Dose reduction technique was used on this scan by utilizing automated exposure control, adjustment of the mA and/or kV according to the patient size.  FINDINGS: Scout film: Unremarkable  Cardiovascular: Suboptimal contrast bolus. Adequate evaluation of the pulmonary arteries to the lobar level. No evidence for central or lobar embolus. Normal caliber thoracic aorta. Heart size normal without pericardial effusion.  Pulmonary: There is no acute airspace disease. There is no pulmonary mass or suspicious nodule. There is no pleural  effusion or pneumothorax. Linear atelectasis or scarring within the left lower lobe. Mediastinum: No mediastinal or hilar adenopathy. Central airways are clear. Esophagus is unremarkable.  Thoracic cage: No acute osseous abnormality. No chest wall contusion.  Upper Abdomen: Upper abdomen is unremarkable.   IMPRESSION: 1.  Suboptimal contrast bolus. No evidence for central or lobar pulmonary embolus. 2.  No acute findings in the chest. Exam End: 10/31/22 16:28   Specimen Collected: 10/31/22 16:37 Last R   Labs pertinent for anemia, last H/H in our system was 7.4/24/0, [facility] 8.5/27  Here today with still feeling pain to upper abd, chest radiates to back and shoulder blade, N/V difficulty eating bigger meals she has been able to tolerate small  things like a banana  She was treated with zofran, albuterol inhaler, carafate, and protonix.   Today patient reports : she has gotten a hold of GI and has an appointment on 11/27/2022.  She reports that the albuterol inhaler did not help.  She reports the pain in her abdomen has improved. She reports that she has continued shortness of breath and chest tightness. She reports that she has had fever off and on. Discussed with patient the differentials from costochondritis to endocarditis. Will get sed rate, crp, cbc and refer to cardiology.  Patient understands if any worsening symptoms to seek emergency care.  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 She is going on 4 years clean.  No changes  Iron deficiency anemia/ history of pe: patient was admitted for pe back in January.  She is not currently on blood thinners. She did leave the hospital ama.   She has a history of iron deficiency anemia.  Ordered labs which were not completed.  Plan was to get labs and then refer to hematology.  Will get them today. Placed referral to hematology.   Relevant past medical, surgical, family and  social history reviewed and updated as indicated. Interim medical history since our last visit reviewed. Allergies and medications reviewed and updated.  Review of Systems  Constitutional:positive for fever , positive for weight change.  Respiratory: Negative for cough and shortness of breath.   Cardiovascular:positive for chest pain,negative palpitations.  Gastrointestinal: positive for abdominal pain, diarrhea/constipation Musculoskeletal: Negative for gait problem or joint swelling.  Skin: Negative for rash. Positive for wound on left lower leg Neurological: Negative for dizziness or headache.  No other specific complaints in a complete review of systems (except as listed in HPI above).      Objective:    BP 120/78   Pulse 95   Temp 97.9 F (36.6 C) (Oral)   Resp 16   Ht 5\' 3"  (1.6 m)   Wt 205 lb 3.2 oz (93.1 kg)   SpO2 99%   BMI 36.35 kg/m   Wt Readings from Last 3 Encounters:  11/22/22 205 lb 3.2 oz (93.1 kg)  11/05/22 199 lb 3.2 oz (90.4 kg)  10/14/22 196 lb 12.8 oz (89.3 kg)    Physical Exam  Constitutional: Patient appears well-developed and well-nourished. Obese  No distress.  HEENT: head atraumatic, normocephalic, pupils equal and reactive to light, neck supple, throat within normal limits Cardiovascular: Normal rate, regular rhythm and normal heart sounds.  No murmur heard. No BLE edema. Pulmonary/Chest: Effort normal and breath sounds normal. No respiratory distress. Abdominal: Soft.  Generalized tenderness Skin: left lower leg large ulceration ( improved) Psychiatric: Patient has a normal mood and affect. behavior is normal. Judgment and thought content normal.      Assessment & Plan:   Problem List Items Addressed This Visit       Nervous and Auditory   Pseudotumor cerebri    Information provided for neurology referral      Relevant Orders   Ambulatory referral to Neurology     Other   IVDU (intravenous drug user)    History of drug use, not  currently using      History of pulmonary embolism    Referral to hematology placed      Relevant Orders   Ambulatory referral to Hematology / Oncology   Iron deficiency anemia    Labs ordered but  have not been done yet, referral to hematology placed      Relevant Orders   Ambulatory referral to Hematology / Oncology   PTSD (post-traumatic stress disorder)    Number provided for psychiatry referral      Borderline personality disorder [facility])    Patient provided number for psychiatry referral       Lupus (HCC)    Unable to get records from previous provider at time of diagnosis.  Will get labs so patient can be seen at rheumatology.  Patient went back to the lab but then declined labs, she says she will come back and get it done.       Relevant Orders   ANA   C-reactive protein   Sedimentation rate   Ambulatory referral to Rheumatology   Ambulatory referral to Cardiology   Wound of left leg    Has appointment with wound care on 11/27/2022      Other Visit Diagnoses     Generalized abdominal pain    -  Primary   improved, still waiting on stool cultures,  patient will bring stool to the office.  lab work had not been done from last visit, she will come back and get it.   Relevant Orders   CALPROTECTIN   Diarrhea, unspecified type       improved, still waiting on stool cultures, patient will bring stool to the office. lab work had not been done from last visit, she will come back and get it.   Relevant Orders   CALPROTECTIN   Loss of appetite       Relevant Orders   CALPROTECTIN   Nausea       Relevant Orders   CALPROTECTIN   Chest pain, unspecified type       nothing concerning noted on exam. will place referral to cardiology, labs ordered but patient did not get them done today she says she will come back.   Relevant Orders   C-reactive protein   Sedimentation rate   Ambulatory referral to Cardiology   Elevated antinuclear antibody (ANA) level       labs  ordered but patient did not get them done today she says she will come back. pt aware that rheumatology will not see her without lab work   Relevant Orders   Ambulatory referral to Rheumatology   Shortness of breath       nothing concerning noted on exam.  will place referral to cardiology, labs ordered but patient did not get them done today she says she will come back.   Relevant Orders   Ambulatory referral to Cardiology       Labs were ordered and previous orders were printed out so lab work could be collected, patient went to the lab but did not get labs done again. Patient reports she is going to come back and try on a different day.  Patient is aware that it is difficult to narrow down her diagnosis with out diagnostic testing.   Follow up plan: Return if symptoms worsen or fail to improve, for needs to have labs done.

## 2022-11-22 NOTE — Assessment & Plan Note (Signed)
Unable to get records from previous provider at time of diagnosis.  Will get labs so patient can be seen at rheumatology.  Patient went back to the lab but then declined labs, she says she will come back and get it done.

## 2022-11-22 NOTE — Assessment & Plan Note (Signed)
Information provided for neurology referral

## 2022-11-22 NOTE — Assessment & Plan Note (Signed)
History of drug use, not currently using

## 2022-11-22 NOTE — Assessment & Plan Note (Signed)
Number provided for psychiatry referral

## 2022-11-22 NOTE — Assessment & Plan Note (Signed)
Has appointment with wound care on 11/27/2022

## 2022-11-22 NOTE — Assessment & Plan Note (Signed)
Referral to hematology placed.

## 2022-11-22 NOTE — Assessment & Plan Note (Addendum)
Labs ordered but  have not been done yet, referral to hematology placed

## 2022-11-22 NOTE — Assessment & Plan Note (Signed)
Patient provided number for psychiatry referral

## 2022-11-25 ENCOUNTER — Other Ambulatory Visit: Payer: Self-pay | Admitting: Family Medicine

## 2022-11-25 ENCOUNTER — Telehealth: Payer: Self-pay | Admitting: Internal Medicine

## 2022-11-25 DIAGNOSIS — R1013 Epigastric pain: Secondary | ICD-10-CM

## 2022-11-25 NOTE — Telephone Encounter (Signed)
Attempt made to reach patient to return her call. Patient would like to reschedule missed appointments from April of this year (referral also rec to re-est care). Mailbox is currently full and unable to receive messages.

## 2022-11-26 NOTE — Progress Notes (Deleted)
Celso Amy, PA-C [address]  [address]  [place], [place] [address]  Main: [phone]  Fax: [phone]   Gastroenterology Consultation  Referring Provider:     Berniece Salines, FNP Primary Care Physician:  Berniece Salines, FNP Primary Gastroenterologist:  *** Reason for Consultation:     Abdominal pain        HPI:   Karen Clarke is a 37 y.o. y/o female referred for consultation & management  by Berniece Salines, FNP.    She has chronic intermittent generalized abdominal pain, decreased appetite, episodes of diarrhea, nausea, vomiting, and chest pain.   Has been on Carafate and Protonix.  Medical history significant for polysubstance abuse including (IV drug use, cocaine, heroin, Marijuana, tobacco, and opioids); recurrent MRSA/MSSA endocarditis, IgA deficiency, history of PE (03/2022) no longer anticoagulated, pyoderma gangrenosum, pseudotumor cerebri, lupus, PTSD/borderline personality disorder.   She has been taking doxycycline for pyoderma gangrenosum.  Currently on methadone.  History of iron deficiency anemia evaluated by [facility] in [place] in 2021.  Previous EGD and Colonoscopy 12/2019 with no source for anemia.  Also history of anemia of chronic disease.  Was scheduled for outpatient capsule endoscopy but never performed.  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  She underwent repeat colonoscpy 06/01/20 with resection at prior biopsy site with pathology showing no adenomatous mucosa or resdidual granular cell tumor. She was advised to proceed with routine colon cancer screening when appropriate.   Lab 10/31/2022: Hemoglobin 8.5g, MCV 76, Plt 408.  CMP, TSH, magnesium normal.  Stool studies were ordered by her PCP 10/2022, and patient did not complete.  ED evaluation for chest pain 10/31/2022 at [facility].  Chest CT negative for PE.    Past Medical History:  Diagnosis Date   Anemia    Anxiety    Borderline personality disorder (HCC)     Depression    Endocarditis    IgA deficiency (HCC)    Lupus (HCC)    Lupus (HCC)    Pseudotumor cerebri    PTSD (post-traumatic stress disorder)     Past Surgical History:  Procedure Laterality Date   CESAREAN SECTION     COLON SURGERY     FINGER AMPUTATION     TEE WITHOUT CARDIOVERSION N/A 02/09/2021   Procedure: TRANSESOPHAGEAL ECHOCARDIOGRAM (TEE);  Surgeon: Antonieta Iba, MD;  Location: [facility] ORS;  Service: Cardiovascular;  Laterality: N/A;   TONSILLECTOMY      Prior to Admission medications   Medication Sig Start Date End Date Taking? Authorizing Provider  acetaminophen (TYLENOL) 325 MG tablet Take 2 tablets (650 mg total) by mouth every 6 (six) hours as needed for mild pain (or Fever >/= 101). 04/06/21   Lurene Shadow, MD  albuterol (VENTOLIN HFA) 108 (90 Base) MCG/ACT inhaler Inhale 2 puffs into the lungs every 6 (six) hours as needed for wheezing or shortness of breath. 11/05/22   Danelle Berry, PA-C  gabapentin (NEURONTIN) 100 MG capsule Take 1 capsule (100 mg total) by mouth 3 (three) times daily. 10/14/22   Berniece Salines, FNP  ibuprofen (ADVIL) 400 MG tablet Take 2 tablets (800 mg total) by mouth every 8 (eight) hours as needed for moderate pain. 04/06/21   Lurene Shadow, MD  melatonin 3 MG TABS tablet Take 3 mg by mouth at bedtime. 04/10/22   [provider]  methadone (DOLOPHINE) 10 MG/ML solution Take 110 mg by mouth daily.    [provider]  ondansetron (ZOFRAN-ODT) 4  MG disintegrating tablet Take 1-2 tablets (4-8 mg total) by mouth every 8 (eight) hours as needed for nausea or vomiting. 11/05/22   Danelle Berry, PA-C  pantoprazole (PROTONIX) 40 MG tablet Take 1 tablet (40 mg total) by mouth 2 (two) times daily. 11/05/22   Danelle Berry, PA-C  phenazopyridine (PYRIDIUM) 200 MG tablet Take by mouth. 11/01/22   [provider]  QUEtiapine (SEROQUEL) 100 MG tablet Take 1 tablet (100 mg total) by mouth at bedtime. 10/14/22   Berniece Salines, FNP  sucralfate  (CARAFATE) 1 g tablet TAKE 1 TABLET( 1 GRAM TOTAL) BY MOUTH THREE TIMES DAILY WITH MEALS AS NEEDED FOR GERD/ REFLUX/ EPIGASTRIC PAIN WHEN EATING OR DRINKING 11/25/22   Berniece Salines, FNP    Family History  Problem Relation Age of Onset   COPD Mother    Heart disease Mother    Mental illness Mother    Multiple sclerosis Mother    Hearing loss Father    Hypertension Father      Social History   Tobacco Use   Smoking status: Every Day    Types: Cigarettes    Passive exposure: Current   Smokeless tobacco: Never  Vaping Use   Vaping status: Former   Substances: Nicotine, Flavoring  Substance Use Topics   Alcohol use: Never   Drug use: Yes    Types: IV, Cocaine, Opium    Comment: clean for 3 years    Allergies as of 11/27/2022 - Review Complete 11/22/2022  Allergen Reaction Noted   Cefazolin Rash 11/01/2018    Review of Systems:    All systems reviewed and negative except where noted in HPI.   Physical Exam:  There were no vitals taken for this visit. No LMP recorded. (Menstrual status: IUD).  General:   Alert,  Well-developed, well-nourished, pleasant and cooperative in NAD Lungs:  Respirations even and unlabored.  Clear throughout to auscultation.   No wheezes, crackles, or rhonchi. No acute distress. Heart:  Regular rate and rhythm; no murmurs, clicks, rubs, or gallops. Abdomen:  Normal bowel sounds.  No bruits.  Soft, and non-distended without masses, hepatosplenomegaly or hernias noted.  No Tenderness.  No guarding or rebound tenderness.    Neurologic:  Alert and oriented x3;  grossly normal neurologically. Psych:  Alert and cooperative. Normal mood and affect.  Imaging Studies: No results found.  Assessment and Plan:   Karen Clarke is a 38 y.o. y/o female has been referred for   1.  Generalized abdominal pain  Rx dicyclomine 10 Mg 3 times daily  Align probiotic  Low FODMAP diet  CT abdomen pelvis with contrast  2.  Diarrhea  Stool Studies: GI Pathogen  Panal, C. Difficile Toxin PCR, Fecal Calprotectin   3.  Chronic nausea and vomiting  H. pylori stool test  EGD  Stop all marijuana and illicit substance.  4.  GERD/gastritis  Continue Protonix and Carafate  5.  Chronic iron deficiency anemia  Labs: CBC, iron panel, ferritin, B12, folate, and celiac lab.   6.  History of colon tumor:  Colonoscopy 12/2019 at [facility]:  biopsy showed a granular cell tumor.  She underwent repeat colonoscpy 06/01/20 with resection at prior biopsy site with pathology showing no adenomatous mucosa or resdidual granular cell tumor.  7.  Multiple comorbidities including polysubstance abuse, on methadone, see HPI.  Follow up ***  Celso Amy, PA-C    BP check ***

## 2022-11-27 ENCOUNTER — Ambulatory Visit: Payer: MEDICAID | Admitting: Physician Assistant

## 2022-11-27 ENCOUNTER — Encounter: Payer: MEDICAID | Attending: Internal Medicine | Admitting: Internal Medicine

## 2022-11-27 ENCOUNTER — Other Ambulatory Visit: Payer: Self-pay | Admitting: Family Medicine

## 2022-11-27 DIAGNOSIS — M329 Systemic lupus erythematosus, unspecified: Secondary | ICD-10-CM | POA: Diagnosis not present

## 2022-11-27 DIAGNOSIS — B1920 Unspecified viral hepatitis C without hepatic coma: Secondary | ICD-10-CM | POA: Insufficient documentation

## 2022-11-27 DIAGNOSIS — F17200 Nicotine dependence, unspecified, uncomplicated: Secondary | ICD-10-CM | POA: Insufficient documentation

## 2022-11-27 DIAGNOSIS — L97828 Non-pressure chronic ulcer of other part of left lower leg with other specified severity: Secondary | ICD-10-CM | POA: Diagnosis present

## 2022-11-27 DIAGNOSIS — L88 Pyoderma gangrenosum: Secondary | ICD-10-CM | POA: Diagnosis not present

## 2022-11-27 DIAGNOSIS — R0602 Shortness of breath: Secondary | ICD-10-CM

## 2022-11-27 DIAGNOSIS — D649 Anemia, unspecified: Secondary | ICD-10-CM | POA: Diagnosis not present

## 2022-11-28 NOTE — Progress Notes (Signed)
Patient/Caregiver Education Details Patient Name: Date of Service: Karen Clarke 9/25/2024andnbsp1:30 PM Medical Record Number: [id number] Patient Account Number: [id number] Date of Birth/Gender: Treating RN: Aug 19, 1985 (37 y.o. Freddy Finner Primary Care Physician: Della Goo Other Clinician: Referring Physician: Treating Physician/Extender: Chauncey Mann, MICHA EL Norlene Campbell, Gunnar Fusi in Treatment: 0 [place], Burnetta Sabin ([id number]) [id number] Page 8 of 10 Education Assessment Education Provided To: Patient Education Topics Provided Welcome [facility] Packet: o Handouts: Welcome [facility] o Methods: Explain/Verbal Responses: State content correctly Electronic Signature(s) Signed: 11/28/2022 4:15:38 PM By: Yevonne Pax RN Entered By: Yevonne Pax on 11/27/2022 11:16:37 -------------------------------------------------------------------------------- Wound Assessment Details Patient Name: Date of Service: Karen Clarke 11/27/2022 1:30 PM Medical Record Number: [id number] Patient Account Number: [id number] Date of Birth/Sex: Treating RN: [birth year] (37 y.o. Freddy Finner Primary Care Chizara Mena: Della Goo Other Clinician: Referring Guelda Batson: Treating Donyae Kohn/Extender: RO BSO N, MICHA EL Norlene Campbell, Gunnar Fusi in Treatment: 0 Wound Status Wound Number: 1 Primary Etiology: Pyoderma Wound Location: Left, Anterior Lower Leg Wound Status: Open Wounding Event: Gradually Appeared Comorbid History: Anemia, Hepatitis C, Lupus Erythematosus Date Acquired: 03/05/2019 Weeks Of Treatment: 0 Clustered Wound: No Photos Wound Measurements Length: (cm) 12 Width: (cm) 10 Depth: (cm) 0.1 Area: (cm) 94.248 Volume: (cm) 9.425 % Reduction in Area: % Reduction in Volume: Epithelialization: None Tunneling: No Undermining: No Wound Description Classification: Full Thickness Without Exposed Support Exudate Amount: Medium Exudate Type: Serosanguineous Brunty, Chameka ([id number]) Exudate Color: red, brown Structures Foul Odor After Cleansing: No Slough/Fibrino Yes [id number] Page 9 of 10 Wound Bed Granulation Amount: Large (67-100%) Exposed Structure Granulation Quality: Red Fascia Exposed: No Necrotic Amount: Small (1-33%) Fat Layer (Subcutaneous Tissue) Exposed: Yes Necrotic Quality: Adherent Slough Tendon Exposed: No Muscle Exposed: No Joint Exposed: No Bone Exposed: No Treatment Notes Wound #1 (Lower Leg) Wound Laterality: Left, Anterior Cleanser Soap and Water Discharge Instruction: Gently cleanse wound with antibacterial soap,  rinse and pat dry prior to dressing wounds Wound Cleanser Discharge Instruction: Wash your hands with soap and water. Remove old dressing, discard into plastic bag and place into trash. Cleanse the wound with Wound Cleanser prior to applying a clean dressing using gauze sponges, not tissues or cotton balls. Do not scrub or use excessive force. Pat dry using gauze sponges, not tissue or cotton balls. Peri-Wound Care Topical Clobetasol Propionate ointment 0.05%, 60 (g) tube Discharge Instruction: Apply as directed. Primary Dressing Gauze Secondary Dressing ABD Pad 5x9 (in/in) Discharge Instruction: Cover with ABD pad Secured With Tubigrip Size E, 3.5x10 (in/yds) Discharge Instruction: double layer Apply 3 Tubigrip E 3-finger-widths below knee to base of toes to secure dressing and/or for swelling. Compression Wrap Compression Stockings Add-Ons Electronic Signature(s) Signed: 11/28/2022 4:15:38 PM By: Yevonne Pax RN Entered By: Yevonne Pax on 11/27/2022 11:11:07 -------------------------------------------------------------------------------- Vitals Details Patient Name: Date of Service: Karen Clarke 11/27/2022 1:30 PM Medical Record Number: [id number] Patient Account Number: [id number] Date of Birth/Sex: Treating RN: [birth year] (37 y.o. Freddy Finner Primary Care Stephaniemarie Stoffel: Della Goo Other Clinician: Referring Alfonza Toft: Treating Arra Connaughton/Extender: RO BSO N, MICHA EL Charmian Muff in Treatment: 0 Vital Signs Time Taken: 13:58 Temperature (F): 98.3 Gawronski, Clementina ([id number]) [id number] Page 10 of 10 Height (in): 63 Pulse (bpm): 83 Source: Stated Respiratory Rate (breaths/min): 18 Weight (lbs): 200 Blood Pressure (mmHg): 127/87 Source: Stated Reference Range: 80 - 120 mg / dl Body Mass Index (BMI): 35.4 Electronic Signature(s) Signed: 11/28/2022  KEALANI, BEAUDREAU [id number]) [id number] Page 1 of 10 Visit Report for 11/27/2022 Allergy List Details Patient Name: Date of Service: Karen Clarke 11/27/2022 1:30 PM Medical Record Number: [id number] Patient Account Number: [id number] Date of Birth/Sex: Treating RN: August 04, 1985 (37 y.o. Freddy Finner Primary Care Raffaela Ladley: Della Goo Other Clinician: Referring Alexzandria Massman: Treating Bethany Cumming/Extender: RO BSO N, MICHA EL Norlene Campbell, Gunnar Fusi in Treatment: 0 Allergies Active Allergies cefazolin Allergy Notes Electronic Signature(s) Signed: 11/28/2022 4:15:38 PM By: Yevonne Pax RN Entered By: Yevonne Pax on 11/27/2022 11:13:20 -------------------------------------------------------------------------------- Arrival Information Details Patient Name: Date of Service: Karen Clarke 11/27/2022 1:30 PM Medical Record Number: [id number] Patient Account Number: [id number] Date of Birth/Sex: Treating RN: 12/14/1985 (37 y.o. Freddy Finner Primary Care Evelyne Makepeace: Della Goo Other Clinician: Referring Kash Davie: Treating Bellina Tokarczyk/Extender: RO BSO Dorris Carnes, MICHA EL Norlene Campbell, Gunnar Fusi in Treatment: 0 Visit Information Patient Arrived: Ambulatory Arrival Time: 13:56 Accompanied By: self Transfer Assistance: None Patient Identification Verified: Yes Secondary Verification Process Completed: Yes Patient Requires Transmission-Based Precautions: No Patient Has Alerts: No Electronic Signature(s) Signed: 11/28/2022 4:15:38 PM By: Yevonne Pax RN Entered By: Yevonne Pax on 11/27/2022 10:56:28 Lanice Shirts ([id number]) [id number] Page 2 of 10 -------------------------------------------------------------------------------- Clinic Level of Care Assessment Details Patient Name: Date of Service: Karen Clarke 11/27/2022 1:30 PM Medical Record Number: [id number] Patient Account Number: [id number] Date of Birth/Sex: Treating RN: [birth year] (37 y.o. Freddy Finner Primary Care Chiquitta Matty: Della Goo Other Clinician: Referring Bryce Kimble: Treating Creedence Kunesh/Extender: RO BSO N, MICHA EL Charmian Muff in Treatment: 0 Clinic Level of Care Assessment Items TOOL 2 Quantity Score X- 1 0 Use when only an EandM is performed on the INITIAL visit ASSESSMENTS - Nursing Assessment / Reassessment X- 1 20 General Physical Exam (combine w/ comprehensive assessment (listed just below) when performed on new pt. evals) X- 1 25 Comprehensive Assessment (HX, ROS, Risk Assessments, Wounds Hx, etc.) ASSESSMENTS - Wound and Skin A ssessment / Reassessment X - Simple Wound Assessment / Reassessment - one wound 1 5 []  - 0 Complex Wound Assessment / Reassessment - multiple wounds []  - 0 Dermatologic / Skin Assessment (not related to wound area) ASSESSMENTS - Ostomy and/or Continence Assessment and Care []  - 0 Incontinence Assessment and Management []  - 0 Ostomy Care Assessment and Management (repouching, etc.) PROCESS - Coordination of Care X - Simple Patient / Family Education for ongoing care 1 15 []  - 0 Complex (extensive) Patient / Family Education for ongoing care []  - 0 Staff obtains Chiropractor, Records, T Results / Process Orders est []  - 0 Staff telephones HHA, Nursing Homes / Clarify orders / etc []  - 0 Routine Transfer to another Facility (non-emergent condition) []  - 0 Routine Hospital Admission (non-emergent condition) X- 1 15 New Admissions / Manufacturing engineer / Ordering NPWT Apligraf, etc. , []  - 0 Emergency Hospital Admission (emergent condition) X- 1 10 Simple Discharge Coordination []  - 0 Complex (extensive) Discharge Coordination PROCESS - Special Needs []  - 0 Pediatric / Minor Patient Management []  - 0 Isolation Patient Management []  - 0 Hearing / Language / Visual special needs []  - 0 Assessment of Community assistance (transportation, D/C planning, etc.) []  - 0 Additional assistance / Altered  mentation []  - 0 Support Surface(s) Assessment (bed, cushion, seat, etc.) INTERVENTIONS - Wound Cleansing / Measurement X- 1 5 Wound Imaging (photographs - any number of wounds) Welden, Juanita ([id number]) [id number] Page 3 of 10 []  - 0 Wound Tracing (instead of  KEALANI, BEAUDREAU [id number]) [id number] Page 1 of 10 Visit Report for 11/27/2022 Allergy List Details Patient Name: Date of Service: Karen Clarke 11/27/2022 1:30 PM Medical Record Number: [id number] Patient Account Number: [id number] Date of Birth/Sex: Treating RN: August 04, 1985 (37 y.o. Freddy Finner Primary Care Raffaela Ladley: Della Goo Other Clinician: Referring Alexzandria Massman: Treating Bethany Cumming/Extender: RO BSO N, MICHA EL Norlene Campbell, Gunnar Fusi in Treatment: 0 Allergies Active Allergies cefazolin Allergy Notes Electronic Signature(s) Signed: 11/28/2022 4:15:38 PM By: Yevonne Pax RN Entered By: Yevonne Pax on 11/27/2022 11:13:20 -------------------------------------------------------------------------------- Arrival Information Details Patient Name: Date of Service: Karen Clarke 11/27/2022 1:30 PM Medical Record Number: [id number] Patient Account Number: [id number] Date of Birth/Sex: Treating RN: 12/14/1985 (37 y.o. Freddy Finner Primary Care Evelyne Makepeace: Della Goo Other Clinician: Referring Kash Davie: Treating Bellina Tokarczyk/Extender: RO BSO Dorris Carnes, MICHA EL Norlene Campbell, Gunnar Fusi in Treatment: 0 Visit Information Patient Arrived: Ambulatory Arrival Time: 13:56 Accompanied By: self Transfer Assistance: None Patient Identification Verified: Yes Secondary Verification Process Completed: Yes Patient Requires Transmission-Based Precautions: No Patient Has Alerts: No Electronic Signature(s) Signed: 11/28/2022 4:15:38 PM By: Yevonne Pax RN Entered By: Yevonne Pax on 11/27/2022 10:56:28 Lanice Shirts ([id number]) [id number] Page 2 of 10 -------------------------------------------------------------------------------- Clinic Level of Care Assessment Details Patient Name: Date of Service: Karen Clarke 11/27/2022 1:30 PM Medical Record Number: [id number] Patient Account Number: [id number] Date of Birth/Sex: Treating RN: [birth year] (37 y.o. Freddy Finner Primary Care Chiquitta Matty: Della Goo Other Clinician: Referring Bryce Kimble: Treating Creedence Kunesh/Extender: RO BSO N, MICHA EL Charmian Muff in Treatment: 0 Clinic Level of Care Assessment Items TOOL 2 Quantity Score X- 1 0 Use when only an EandM is performed on the INITIAL visit ASSESSMENTS - Nursing Assessment / Reassessment X- 1 20 General Physical Exam (combine w/ comprehensive assessment (listed just below) when performed on new pt. evals) X- 1 25 Comprehensive Assessment (HX, ROS, Risk Assessments, Wounds Hx, etc.) ASSESSMENTS - Wound and Skin A ssessment / Reassessment X - Simple Wound Assessment / Reassessment - one wound 1 5 []  - 0 Complex Wound Assessment / Reassessment - multiple wounds []  - 0 Dermatologic / Skin Assessment (not related to wound area) ASSESSMENTS - Ostomy and/or Continence Assessment and Care []  - 0 Incontinence Assessment and Management []  - 0 Ostomy Care Assessment and Management (repouching, etc.) PROCESS - Coordination of Care X - Simple Patient / Family Education for ongoing care 1 15 []  - 0 Complex (extensive) Patient / Family Education for ongoing care []  - 0 Staff obtains Chiropractor, Records, T Results / Process Orders est []  - 0 Staff telephones HHA, Nursing Homes / Clarify orders / etc []  - 0 Routine Transfer to another Facility (non-emergent condition) []  - 0 Routine Hospital Admission (non-emergent condition) X- 1 15 New Admissions / Manufacturing engineer / Ordering NPWT Apligraf, etc. , []  - 0 Emergency Hospital Admission (emergent condition) X- 1 10 Simple Discharge Coordination []  - 0 Complex (extensive) Discharge Coordination PROCESS - Special Needs []  - 0 Pediatric / Minor Patient Management []  - 0 Isolation Patient Management []  - 0 Hearing / Language / Visual special needs []  - 0 Assessment of Community assistance (transportation, D/C planning, etc.) []  - 0 Additional assistance / Altered  mentation []  - 0 Support Surface(s) Assessment (bed, cushion, seat, etc.) INTERVENTIONS - Wound Cleansing / Measurement X- 1 5 Wound Imaging (photographs - any number of wounds) Welden, Juanita ([id number]) [id number] Page 3 of 10 []  - 0 Wound Tracing (instead of  Patient/Caregiver Education Details Patient Name: Date of Service: Karen Clarke 9/25/2024andnbsp1:30 PM Medical Record Number: [id number] Patient Account Number: [id number] Date of Birth/Gender: Treating RN: Aug 19, 1985 (37 y.o. Freddy Finner Primary Care Physician: Della Goo Other Clinician: Referring Physician: Treating Physician/Extender: Chauncey Mann, MICHA EL Norlene Campbell, Gunnar Fusi in Treatment: 0 [place], Burnetta Sabin ([id number]) [id number] Page 8 of 10 Education Assessment Education Provided To: Patient Education Topics Provided Welcome [facility] Packet: o Handouts: Welcome [facility] o Methods: Explain/Verbal Responses: State content correctly Electronic Signature(s) Signed: 11/28/2022 4:15:38 PM By: Yevonne Pax RN Entered By: Yevonne Pax on 11/27/2022 11:16:37 -------------------------------------------------------------------------------- Wound Assessment Details Patient Name: Date of Service: Karen Clarke 11/27/2022 1:30 PM Medical Record Number: [id number] Patient Account Number: [id number] Date of Birth/Sex: Treating RN: [birth year] (37 y.o. Freddy Finner Primary Care Chizara Mena: Della Goo Other Clinician: Referring Guelda Batson: Treating Donyae Kohn/Extender: RO BSO N, MICHA EL Norlene Campbell, Gunnar Fusi in Treatment: 0 Wound Status Wound Number: 1 Primary Etiology: Pyoderma Wound Location: Left, Anterior Lower Leg Wound Status: Open Wounding Event: Gradually Appeared Comorbid History: Anemia, Hepatitis C, Lupus Erythematosus Date Acquired: 03/05/2019 Weeks Of Treatment: 0 Clustered Wound: No Photos Wound Measurements Length: (cm) 12 Width: (cm) 10 Depth: (cm) 0.1 Area: (cm) 94.248 Volume: (cm) 9.425 % Reduction in Area: % Reduction in Volume: Epithelialization: None Tunneling: No Undermining: No Wound Description Classification: Full Thickness Without Exposed Support Exudate Amount: Medium Exudate Type: Serosanguineous Brunty, Chameka ([id number]) Exudate Color: red, brown Structures Foul Odor After Cleansing: No Slough/Fibrino Yes [id number] Page 9 of 10 Wound Bed Granulation Amount: Large (67-100%) Exposed Structure Granulation Quality: Red Fascia Exposed: No Necrotic Amount: Small (1-33%) Fat Layer (Subcutaneous Tissue) Exposed: Yes Necrotic Quality: Adherent Slough Tendon Exposed: No Muscle Exposed: No Joint Exposed: No Bone Exposed: No Treatment Notes Wound #1 (Lower Leg) Wound Laterality: Left, Anterior Cleanser Soap and Water Discharge Instruction: Gently cleanse wound with antibacterial soap,  rinse and pat dry prior to dressing wounds Wound Cleanser Discharge Instruction: Wash your hands with soap and water. Remove old dressing, discard into plastic bag and place into trash. Cleanse the wound with Wound Cleanser prior to applying a clean dressing using gauze sponges, not tissues or cotton balls. Do not scrub or use excessive force. Pat dry using gauze sponges, not tissue or cotton balls. Peri-Wound Care Topical Clobetasol Propionate ointment 0.05%, 60 (g) tube Discharge Instruction: Apply as directed. Primary Dressing Gauze Secondary Dressing ABD Pad 5x9 (in/in) Discharge Instruction: Cover with ABD pad Secured With Tubigrip Size E, 3.5x10 (in/yds) Discharge Instruction: double layer Apply 3 Tubigrip E 3-finger-widths below knee to base of toes to secure dressing and/or for swelling. Compression Wrap Compression Stockings Add-Ons Electronic Signature(s) Signed: 11/28/2022 4:15:38 PM By: Yevonne Pax RN Entered By: Yevonne Pax on 11/27/2022 11:11:07 -------------------------------------------------------------------------------- Vitals Details Patient Name: Date of Service: Karen Clarke 11/27/2022 1:30 PM Medical Record Number: [id number] Patient Account Number: [id number] Date of Birth/Sex: Treating RN: [birth year] (37 y.o. Freddy Finner Primary Care Stephaniemarie Stoffel: Della Goo Other Clinician: Referring Alfonza Toft: Treating Arra Connaughton/Extender: RO BSO N, MICHA EL Charmian Muff in Treatment: 0 Vital Signs Time Taken: 13:58 Temperature (F): 98.3 Gawronski, Clementina ([id number]) [id number] Page 10 of 10 Height (in): 63 Pulse (bpm): 83 Source: Stated Respiratory Rate (breaths/min): 18 Weight (lbs): 200 Blood Pressure (mmHg): 127/87 Source: Stated Reference Range: 80 - 120 mg / dl Body Mass Index (BMI): 35.4 Electronic Signature(s) Signed: 11/28/2022  Patient/Caregiver Education Details Patient Name: Date of Service: Karen Clarke 9/25/2024andnbsp1:30 PM Medical Record Number: [id number] Patient Account Number: [id number] Date of Birth/Gender: Treating RN: Aug 19, 1985 (37 y.o. Freddy Finner Primary Care Physician: Della Goo Other Clinician: Referring Physician: Treating Physician/Extender: Chauncey Mann, MICHA EL Norlene Campbell, Gunnar Fusi in Treatment: 0 [place], Burnetta Sabin ([id number]) [id number] Page 8 of 10 Education Assessment Education Provided To: Patient Education Topics Provided [facility] Packet: o Handouts: [facility] o Methods: Explain/Verbal Responses: State content correctly Electronic Signature(s) Signed: 11/28/2022 4:15:38 PM By: Yevonne Pax RN Entered By: Yevonne Pax on 11/27/2022 11:16:37 -------------------------------------------------------------------------------- Wound Assessment Details Patient Name: Date of Service: Karen Clarke 11/27/2022 1:30 PM Medical Record Number: [id number] Patient Account Number: [id number] Date of Birth/Sex: Treating RN: [birth year] (37 y.o. Freddy Finner Primary Care Chizara Mena: Della Goo Other Clinician: Referring Guelda Batson: Treating Donyae Kohn/Extender: RO BSO N, MICHA EL Norlene Campbell, Gunnar Fusi in Treatment: 0 Wound Status Wound Number: 1 Primary Etiology: Pyoderma Wound Location: Left, Anterior Lower Leg Wound Status: Open Wounding Event: Gradually Appeared Comorbid History: Anemia, Hepatitis C, Lupus Erythematosus Date Acquired: 03/05/2019 Weeks Of Treatment: 0 Clustered Wound: No Photos Wound Measurements Length: (cm) 12 Width: (cm) 10 Depth: (cm) 0.1 Area: (cm) 94.248 Volume: (cm) 9.425 % Reduction in Area: % Reduction in Volume: Epithelialization: None Tunneling: No Undermining: No Wound Description Classification: Full Thickness Without Exposed Support Exudate Amount: Medium Exudate Type: Serosanguineous Brunty, Chameka ([id number]) Exudate Color: red, brown Structures Foul Odor After Cleansing: No Slough/Fibrino Yes [id number] Page 9 of 10 Wound Bed Granulation Amount: Large (67-100%) Exposed Structure Granulation Quality: Red Fascia Exposed: No Necrotic Amount: Small (1-33%) Fat Layer (Subcutaneous Tissue) Exposed: Yes Necrotic Quality: Adherent Slough Tendon Exposed: No Muscle Exposed: No Joint Exposed: No Bone Exposed: No Treatment Notes Wound #1 (Lower Leg) Wound Laterality: Left, Anterior Cleanser Soap and Water Discharge Instruction: Gently cleanse wound with antibacterial soap,  rinse and pat dry prior to dressing wounds Wound Cleanser Discharge Instruction: Wash your hands with soap and water. Remove old dressing, discard into plastic bag and place into trash. Cleanse the wound with Wound Cleanser prior to applying a clean dressing using gauze sponges, not tissues or cotton balls. Do not scrub or use excessive force. Pat dry using gauze sponges, not tissue or cotton balls. Peri-Wound Care Topical Clobetasol Propionate ointment 0.05%, 60 (g) tube Discharge Instruction: Apply as directed. Primary Dressing Gauze Secondary Dressing ABD Pad 5x9 (in/in) Discharge Instruction: Cover with ABD pad Secured With Tubigrip Size E, 3.5x10 (in/yds) Discharge Instruction: double layer Apply 3 Tubigrip E 3-finger-widths below knee to base of toes to secure dressing and/or for swelling. Compression Wrap Compression Stockings Add-Ons Electronic Signature(s) Signed: 11/28/2022 4:15:38 PM By: Yevonne Pax RN Entered By: Yevonne Pax on 11/27/2022 11:11:07 -------------------------------------------------------------------------------- Vitals Details Patient Name: Date of Service: Karen Clarke 11/27/2022 1:30 PM Medical Record Number: [id number] Patient Account Number: [id number] Date of Birth/Sex: Treating RN: [birth year] (37 y.o. Freddy Finner Primary Care Stephaniemarie Stoffel: Della Goo Other Clinician: Referring Alfonza Toft: Treating Arra Connaughton/Extender: RO BSO N, MICHA EL Charmian Muff in Treatment: 0 Vital Signs Time Taken: 13:58 Temperature (F): 98.3 Gawronski, Clementina ([id number]) [id number] Page 10 of 10 Height (in): 63 Pulse (bpm): 83 Source: Stated Respiratory Rate (breaths/min): 18 Weight (lbs): 200 Blood Pressure (mmHg): 127/87 Source: Stated Reference Range: 80 - 120 mg / dl Body Mass Index (BMI): 35.4 Electronic Signature(s) Signed: 11/28/2022

## 2022-11-28 NOTE — Progress Notes (Signed)
SUNNI, DIFRANCO ([id number]) [id number] Page 1 of 5 Visit Report for 11/27/2022 Abuse Risk Screen Details Patient Name: Date of Service: Karen Clarke 11/27/2022 1:30 PM Medical Record Number: [id number] Patient Account Number: [id number] Date of Birth/Sex: Treating RN: 29-Oct-1985 (37 y.o. Freddy Finner Primary Care Estelle Greenleaf: Della Goo Other Clinician: Referring Aubrianne Molyneux: Treating Deoni Cosey/Extender: RO BSO N, MICHA EL Norlene Campbell, Gunnar Fusi in Treatment: 0 Abuse Risk Screen Items Answer ABUSE RISK SCREEN: Has anyone close to you tried to hurt or harm you recentlyo No Do you feel uncomfortable with anyone in your familyo No Has anyone forced you do things that you didnt want to doo No Electronic Signature(s) Signed: 11/28/2022 4:15:38 PM By: Yevonne Pax RN Entered By: Yevonne Pax on 11/27/2022 14:02:47 -------------------------------------------------------------------------------- Activities of Daily Living Details Patient Name: Date of Service: Karen Clarke 11/27/2022 1:30 PM Medical Record Number: [id number] Patient Account Number: [id number] Date of Birth/Sex: Treating RN: 05-Sep-1985 (37 y.o. Freddy Finner Primary Care Ladeja Pelham: Della Goo Other Clinician: Referring Izabell Schalk: Treating Adison Reifsteck/Extender: RO BSO N, MICHA EL Charmian Muff in Treatment: 0 Activities of Daily Living Items Answer Activities of Daily Living (Please select one for each item) Drive Automobile Completely Able T Medications ake Completely Able Use T elephone Completely Able Care for Appearance Completely Able Use T oilet Completely Able Bath / Shower Completely Able Dress Self Completely Able Feed Self Completely Able Walk Completely Able Get In / Out Bed Completely Able Housework Completely Candy Lohn, Burnetta Sabin ([id number]) [id number] Page 2 of 5 Prepare Meals Completely Able Handle Money Completely Able Shop for  Self Completely Able Electronic Signature(s) Signed: 11/28/2022 4:15:38 PM By: Yevonne Pax RN Entered By: Yevonne Pax on 11/27/2022 14:03:07 -------------------------------------------------------------------------------- Education Screening Details Patient Name: Date of Service: Karen Clarke 11/27/2022 1:30 PM Medical Record Number: [id number] Patient Account Number: [id number] Date of Birth/Sex: Treating RN: [birth year] (37 y.o. Freddy Finner Primary Care Skyann Ganim: Della Goo Other Clinician: Referring Kristeena Meineke: Treating Tanaka Gillen/Extender: RO BSO Dorris Carnes, MICHA EL Norlene Campbell, Gunnar Fusi in Treatment: 0 Primary Learner Assessed: Patient Learning Preferences/Education Level/Primary Language Learning Preference: Explanation Highest Education Level: High School Preferred Language: English Cognitive Barrier Language Barrier: No Translator Needed: No Memory Deficit: No Emotional Barrier: No Cultural/Religious Beliefs Affecting Medical Care: No Physical Barrier Impaired Vision: No Impaired Hearing: No Decreased Hand dexterity: No Knowledge/Comprehension Knowledge Level: Medium Comprehension Level: High Ability to understand written instructions: High Ability to understand verbal instructions: High Motivation Anxiety Level: Anxious Cooperation: Cooperative Education Importance: Acknowledges Need Interest in Health Problems: Asks Questions Perception: Coherent Willingness to Engage in Self-Management High Activities: Readiness to Engage in Self-Management High Activities: Electronic Signature(s) Signed: 11/28/2022 4:15:38 PM By: Yevonne Pax RN Entered By: Yevonne Pax on 11/27/2022 14:03:57 Lanice Shirts ([id number]) [id number] Page 3 of 5 -------------------------------------------------------------------------------- Fall Risk Assessment Details Patient Name: Date of Service: Karen Clarke 11/27/2022 1:30 PM Medical Record Number:  [id number] Patient Account Number: [id number] Date of Birth/Sex: Treating RN: [birth year] (37 y.o. Freddy Finner Primary Care Shanai Lartigue: Della Goo Other Clinician: Referring Lei Dower: Treating Montoya Watkin/Extender: RO BSO N, MICHA EL Norlene Campbell, Gunnar Fusi in Treatment: 0 Fall Risk Assessment Items Have you had 2 or more falls in the last 12 monthso 0 No Have you had any fall that resulted in injury in the last 12 monthso 0 No FALLS RISK SCREEN History of falling - immediate or within 3 months 0 No Secondary diagnosis (Do you  have 2 or more medical diagnoseso) 0 No Ambulatory aid None/bed rest/wheelchair/nurse 0 No Crutches/cane/walker 0 No Furniture 0 No Intravenous therapy Access/Saline/Heparin Lock 0 No Gait/Transferring Normal/ bed rest/ wheelchair 0 No Weak (short steps with or without shuffle, stooped but able to lift head while walking, may seek 0 No support from furniture) Impaired (short steps with shuffle, may have difficulty arising from chair, head down, impaired 0 No balance) Mental Status Oriented to own ability 0 No Electronic Signature(s) Signed: 11/28/2022 4:15:38 PM By: Yevonne Pax RN Entered By: Yevonne Pax on 11/27/2022 14:04:07 -------------------------------------------------------------------------------- Foot Assessment Details Patient Name: Date of Service: Karen Clarke 11/27/2022 1:30 PM Medical Record Number: [id number] Patient Account Number: [id number] Date of Birth/Sex: Treating RN: [birth year] (37 y.o. Freddy Finner Primary Care Morrison Masser: Della Goo Other Clinician: Referring Dartha Rozzell: Treating Yifan Auker/Extender: RO BSO Dorris Carnes, MICHA EL Charmian Muff in Treatment: 0 Foot Assessment Items Site Locations Bronte, [place] ([id number]) [id number] Page 4 of 5 + = Sensation present, - = Sensation absent, C = Callus, U = Ulcer R = Redness, W = Warmth, M = Maceration, PU = Pre-ulcerative lesion F = Fissure, S =  Swelling, D = Dryness Assessment Right: Left: Other Deformity: No No Prior Foot Ulcer: No No Prior Amputation: No No Charcot Joint: No No Ambulatory Status: Ambulatory Without Help Gait: Steady Electronic Signature(s) Signed: 11/28/2022 4:15:38 PM By: Yevonne Pax RN Entered By: Yevonne Pax on 11/27/2022 14:04:27 -------------------------------------------------------------------------------- Nutrition Risk Screening Details Patient Name: Date of Service: Karen Clarke 11/27/2022 1:30 PM Medical Record Number: [id number] Patient Account Number: [id number] Date of Birth/Sex: Treating RN: 04-22-1985 (37 y.o. Freddy Finner Primary Care Chyann Ambrocio: Della Goo Other Clinician: Referring Macall Mccroskey: Treating Jeven Topper/Extender: RO BSO N, MICHA EL Norlene Campbell, Gunnar Fusi in Treatment: 0 Height (in): 63 Weight (lbs): 200 Body Mass Index (BMI): 35.4 Nutrition Risk Screening Items Score Screening NUTRITION RISK SCREEN: I have an illness or condition that made me change the kind and/or amount of food I eat 0 No I eat fewer than two meals per day 0 No I eat few fruits and vegetables, or milk products 0 No I have three or more drinks of beer, liquor or wine almost every day 0 No I have tooth or mouth problems that make it hard for me to eat 0 No I don't always have enough money to buy the food I need 0 No Dematteo, Ronae ([id number]) [id number] Page 5 of 5 I eat alone most of the time 0 No I take three or more different prescribed or over-the-counter drugs a day 1 Yes Without wanting to, I have lost or gained 10 pounds in the last six months 0 No I am not always physically able to shop, cook and/or feed myself 0 No Nutrition Protocols Good Risk Protocol 0 No interventions needed Moderate Risk Protocol High Risk Proctocol Risk Level: Good Risk Score: 1 Electronic Signature(s) Signed: 11/28/2022 4:15:38 PM By: Yevonne Pax RN Entered By: Yevonne Pax on  11/27/2022 14:04:18

## 2022-11-28 NOTE — Progress Notes (Signed)
KEBRA, HALKER [id number] [id number] Page 1 of 7 Visit Report for 11/27/2022 Chief Complaint Document Details Patient Name: Date of Service: Karen Clarke 11/27/2022 1:30 PM Medical Record Number: [id number] Patient Account Number: [id number] Date of Birth/Sex: Treating RN: Jul 14, 1985 (37 y.o. Freddy Finner Primary Care Provider: Della Goo Other Clinician: Referring Provider: Treating Provider/Extender: RO BSO Dorris Carnes, MICHA EL Norlene Campbell, Gunnar Fusi in Treatment: 0 Information Obtained from: Patient Chief Complaint 11/27/2022; patient is here for review of a large chronic wound on the left anterior lower leg Electronic Signature(s) Signed: 11/27/2022 4:28:36 PM By: Baltazar Najjar MD Entered By: Baltazar Najjar on 11/27/2022 15:04:27 -------------------------------------------------------------------------------- HPI Details Patient Name: Date of Service: Karen Clarke 11/27/2022 1:30 PM Medical Record Number: [id number] Patient Account Number: [id number] Date of Birth/Sex: Treating RN: 10/10/1985 (37 y.o. Freddy Finner Primary Care Provider: Della Goo Other Clinician: Referring Provider: Treating Provider/Extender: RO BSO Dorris Carnes, MICHA EL Charmian Muff in Treatment: 0 History of Present Illness HPI Description: ADMISSION 11/27/2022 This is a 37 year old woman who has had an area on her left pretibial region for as long as 3 years according to her. 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 She only recently has had Medicaid as such she has had very little disposable income to purchase wound care products such as Vashe solution, steroids, compression etc. She has attended the [facility] wound care clinic in the past starting in  June 2022 however I think her attendance has been marginal. I am able to find a single biopsy result quoted although this is not the actual pathologist it suggests that in the differential is pyoderma gangrenosum if infectious etiologies were not adequately excluded. Her C-reactive protein and sedimentation rate in 2023 were normal i.e. not elevated. An MRI of the tib-fib was apparently done although I had trouble finding these results today. As mentioned the patient did not have insurance till recently and she has been able to get Medicaid. She has been washing this off with soap and water apparently applying a Kotex pad and Vaseline. She states the wound is exceptionally painful and sensitive. She also tells [place] that the compression wraps were tried in the past and she simply could not tolerate them within a day Past medical history is extensive; she tells me that she is anemic and apparently has bleeding ulcers in her colon but she is unaware that she has been formally diagnosed with inflammatory bowel disease. She has a history of endocarditis with septic emboli, opioid dependency, leg swelling, systemic lupus, Menzie, Sulamita ([id number]) [id number] Page 2 of 7 methadone dependence, PTSD, We did not attempt to do ABIs in this clinic today because of the pain Electronic Signature(s) Signed: 11/27/2022 4:28:36 PM By: Baltazar Najjar MD Entered By: Baltazar Najjar on 11/27/2022 15:10:59 -------------------------------------------------------------------------------- Physical Exam Details Patient Name: Date of Service: Karen Clarke 11/27/2022 1:30 PM Medical Record Number: [id number] Patient Account Number: [id number] Date of Birth/Sex: Treating RN: 11/30/1985 (37 y.o. Freddy Finner Primary Care Provider: Della Goo Other Clinician: Referring Provider: Treating Provider/Extender: RO BSO N, MICHA EL Norlene Campbell, Gunnar Fusi in Treatment: 0 Constitutional Sitting or  standing Blood Pressure is within target range for patient.. Pulse regular and  Electronic Signature(s) Signed: 11/27/2022 4:28:36 PM By: Baltazar Najjar MD Signed: 11/28/2022 4:15:38 PM By: Yevonne Pax RN Entered By: Yevonne Pax on 11/27/2022 14:02:10 Lanice Shirts ([id number]) [id number] Page 7 of 7 -------------------------------------------------------------------------------- SuperBill Details Patient Name: Date of Service: Karen Clarke 11/27/2022 Medical Record Number: [id number] Patient Account Number: [id number] Date of Birth/Sex: Treating RN: 1985-10-13 (37 y.o. Freddy Finner Primary Care Provider: Della Goo Other Clinician: Referring Provider: Treating Provider/Extender: RO BSO N, MICHA EL Charmian Muff in Treatment: 0 Diagnosis Coding ICD-10 Codes Code Description [phone] Non-pressure chronic ulcer of other part of left lower leg with other specified severity L88 Pyoderma gangrenosum Facility Procedures : CPT4 Code: [id number] Description: 99213 - WOUND CARE VISIT-LEV 3 EST PT Modifier: Quantity: 1 Physician Procedures : CPT4 Code Description Modifier [id number] - WC PHYS LEVEL 4 - NEW PT ICD-10 Diagnosis Description L97.828 Non-pressure chronic ulcer of other part of left lower leg with other specified severity L88 Pyoderma gangrenosum Quantity: 1 Electronic Signature(s) Signed: 11/27/2022 4:28:36 PM By: Baltazar Najjar MD Previous Signature: 11/27/2022 3:12:40 PM Version By: Yevonne Pax RN Entered By: Baltazar Najjar on 11/27/2022 15:15:49  Electronic Signature(s) Signed: 11/27/2022 4:28:36 PM By: Baltazar Najjar MD Signed: 11/28/2022 4:15:38 PM By: Yevonne Pax RN Entered By: Yevonne Pax on 11/27/2022 14:02:10 Lanice Shirts ([id number]) [id number] Page 7 of 7 -------------------------------------------------------------------------------- SuperBill Details Patient Name: Date of Service: Karen Clarke 11/27/2022 Medical Record Number: [id number] Patient Account Number: [id number] Date of Birth/Sex: Treating RN: 1985-10-13 (37 y.o. Freddy Finner Primary Care Provider: Della Goo Other Clinician: Referring Provider: Treating Provider/Extender: RO BSO N, MICHA EL Charmian Muff in Treatment: 0 Diagnosis Coding ICD-10 Codes Code Description [phone] Non-pressure chronic ulcer of other part of left lower leg with other specified severity L88 Pyoderma gangrenosum Facility Procedures : CPT4 Code: [id number] Description: 99213 - WOUND CARE VISIT-LEV 3 EST PT Modifier: Quantity: 1 Physician Procedures : CPT4 Code Description Modifier [id number] - WC PHYS LEVEL 4 - NEW PT ICD-10 Diagnosis Description L97.828 Non-pressure chronic ulcer of other part of left lower leg with other specified severity L88 Pyoderma gangrenosum Quantity: 1 Electronic Signature(s) Signed: 11/27/2022 4:28:36 PM By: Baltazar Najjar MD Previous Signature: 11/27/2022 3:12:40 PM Version By: Yevonne Pax RN Entered By: Baltazar Najjar on 11/27/2022 15:15:49  Pressure is within target range for patient.. Pulse regular and within target range for patient.Marland Kitchen Respirations regular, non-labored and within target range.. Temperature is normal and within the target range for the patient.Marland Kitchen appears in no distress. Vitals Time Taken: 1:58 PM, Height: 63 in, Source: Stated, Weight: 200 lbs, Source: Stated, BMI: 35.4, Temperature: 98.3 F, Pulse: 83 bpm, Respiratory Rate: 18 breaths/min, Blood Pressure: 127/87 mmHg. Respiratory Respiratory effort is easy and symmetric bilaterally. Rate is normal at rest and on room air.. Bilateral breath sounds are clear and equal in all lobes with no wheezes, rales or rhonchi.. Cardiovascular 2 out of 6 systolic ejection murmur at the lower left sternal border accentuated second sound no gallops. Pedal pulses are palpable on the left. General Notes: Wound exam; the area questions on the left anterior mid tibia. Extensive wound slightly hyper granulated with a nonviable gritty surface. The wound is large but not grossly infected. She tells me there is drainage that would seem quite likely Integumentary (Hair, Skin) No additional skin changes are seen except for the wound on her left anterior lower leg. There are healed wounds on the right lateral leg and left foot. Wound #1 status is Open. Original cause of wound was Gradually Appeared. The date acquired was: 03/05/2019. The wound is located on the Left,Anterior Lower Leg. The wound measures 12cm length x 10cm width x 0.1cm depth; 94.248cm^2 area and 9.425cm^3 volume. There is Fat Layer (Subcutaneous Tissue) exposed. There is no tunneling or undermining noted. There is a  medium amount of serosanguineous drainage noted. There is large (67-100%) red granulation within the wound bed. There is a small (1-33%) amount of necrotic tissue within the wound bed including Adherent Slough. 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 From my review of a few records from [facility] it would seem very likely that this is pyoderma gangrenosum. No other obvious abnormality is suggested. 2. She has had previous biopsies I did not feel the need to do this today 3. When she comes here next week I will try to look through more of the records at [facility] which are extensive. She has had an MRI of the tib-fib at some point I could not find this today. Formal pathologist records would also be of benefit. Karen Clarke, Karen Clarke [id number]) [id number] Page 6 of 7 4. I prescribed clobetasol, ABDs and we used double layer Tubigrip for compression. She was convincing that she would not be able to stand more aggressive compression. Electronic Signature(s) Signed: 11/27/2022 4:28:36 PM By: Baltazar Najjar MD Entered By: Baltazar Najjar on 11/27/2022 15:15:23 -------------------------------------------------------------------------------- ROS/PFSH Details Patient Name: Date of Service: Karen Clarke 11/27/2022 1:30 PM Medical Record Number: [id number] Patient Account Number: [id number] Date of Birth/Sex: Treating RN: 1985-05-31 (37 y.o. Freddy Finner Primary Care Provider: Della Goo Other Clinician: Referring Provider: Treating Provider/Extender: RO BSO N, MICHA EL Norlene Campbell, Gunnar Fusi in Treatment: 0 Integumentary (Skin) Complaints and Symptoms: Positive for: Wounds;  Swelling Hematologic/Lymphatic Medical History: Positive for: Anemia Gastrointestinal Medical History: Positive for: Hepatitis C Immunological Medical History: Positive for: Lupus Erythematosus Immunizations Pneumococcal Vaccine: Received Pneumococcal Vaccination: No Implantable Devices None Family and Social History Current every day smoker; Marital Status - Single; Alcohol Use: Never; Drug Use: Prior History; Caffeine Use: Rarely  within target range for patient.Marland Kitchen Respirations regular, non-labored and within target range.. Temperature is normal and within the target range for the patient.Marland Kitchen appears in no distress. Respiratory Respiratory effort is easy and symmetric bilaterally. Rate is normal at rest and on room air.. Bilateral breath sounds are clear and equal in all lobes with no wheezes, rales or rhonchi.. Cardiovascular 2 out of 6 systolic ejection murmur at the lower left sternal border accentuated second sound no gallops. Pedal pulses are palpable on the left. Integumentary (Hair, Skin) No additional skin changes are seen except for the wound on her left anterior lower leg. There are healed wounds on the right lateral leg and left foot. Notes Wound exam; the area questions on the left anterior mid tibia. Extensive wound slightly hyper granulated with a nonviable gritty surface. The wound is large but not grossly infected. She tells me there is drainage that would seem quite likely Electronic Signature(s) Signed: 11/27/2022 4:28:36 PM By: Baltazar Najjar MD Entered By: Baltazar Najjar on 11/27/2022 15:13:34 -------------------------------------------------------------------------------- Physician Orders Details Patient Name: Date of Service: Karen Clarke 11/27/2022 1:30 PM Medical Record Number: [id number] Patient Account Number: [id number] Date of Birth/Sex: Treating RN: 10-09-1985 (37 y.o. Freddy Finner Primary Care Provider: Della Goo Other Clinician: Referring Provider: Treating Provider/Extender: Chauncey Mann, MICHA EL Norlene Campbell, Gunnar Fusi in Treatment: 0 Benton, Burnetta Sabin ([id number]) [id number] Page 3 of 7 Verbal / Phone Orders: No Diagnosis Coding ICD-10 Coding Code Description [phone] Non-pressure chronic ulcer of other part of left lower leg with other specified severity L88 Pyoderma  gangrenosum Follow-up Appointments Return Appointment in 1 week. Bathing/ Shower/ Hygiene May shower; gently cleanse wound with antibacterial soap, rinse and pat dry prior to dressing wounds Edema Control - Lymphedema / Segmental Compressive Device / Other Elevate, Exercise Daily and A void Standing for Long Periods of Time. Elevate legs to the level of the heart and pump ankles as often as possible Elevate leg(s) parallel to the floor when sitting. Wound Treatment Wound #1 - Lower Leg Wound Laterality: Left, Anterior Cleanser: Soap and Water 1 x Per Day/30 Days Discharge Instructions: Gently cleanse wound with antibacterial soap, rinse and pat dry prior to dressing wounds Cleanser: Wound Cleanser 1 x Per Day/30 Days Discharge Instructions: Wash your hands with soap and water. Remove old dressing, discard into plastic bag and place into trash. Cleanse the wound with Wound Cleanser prior to applying a clean dressing using gauze sponges, not tissues or cotton balls. Do not scrub or use excessive force. Pat dry using gauze sponges, not tissue or cotton balls. Topical: Clobetasol Propionate ointment 0.05%, 60 (g) tube 1 x Per Day/30 Days Discharge Instructions: Apply as directed. Prim Dressing: Gauze ary 1 x Per Day/30 Days Secondary Dressing: ABD Pad 5x9 (in/in) 1 x Per Day/30 Days Discharge Instructions: Cover with ABD pad Secured With: Tubigrip Size E, 3.5x10 (in/yds) 1 x Per Day/30 Days Discharge Instructions: double layer Apply 3 Tubigrip E 3-finger-widths below knee to base of toes to secure dressing and/or for swelling. Patient Medications llergies: cefazolin A Notifications Medication Indication Start End pyoderma 11/27/2022 clobetasol gangrenosum DOSE topical 0.025 % cream - cream topical once daily lightly to affected area with dressing change Electronic Signature(s) Signed: 11/27/2022 3:54:40 PM By: Yevonne Pax RN Signed: 11/27/2022 4:28:36 PM By: Baltazar Najjar MD Previous  Signature: 11/27/2022 3:01:16 PM Version By: Baltazar Najjar MD Entered By: Yevonne Pax on 11/27/2022 15:54:40 -------------------------------------------------------------------------------- Problem List Details Patient Name: Date of Service: LO MA X,

## 2022-12-04 ENCOUNTER — Ambulatory Visit: Payer: MEDICAID | Admitting: Internal Medicine

## 2022-12-11 ENCOUNTER — Ambulatory Visit: Payer: MEDICAID | Admitting: Internal Medicine

## 2022-12-18 ENCOUNTER — Ambulatory Visit: Payer: MEDICAID | Admitting: Internal Medicine

## 2022-12-24 DIAGNOSIS — M549 Dorsalgia, unspecified: Secondary | ICD-10-CM | POA: Insufficient documentation

## 2022-12-24 HISTORY — DX: Dorsalgia, unspecified: M54.9

## 2023-01-13 ENCOUNTER — Telehealth: Payer: Self-pay

## 2023-01-13 NOTE — Transitions of Care (Post Inpatient/ED Visit) (Signed)
   01/13/2023  Name: Karen Clarke MRN: [id number] DOB: [birth year]  Today's TOC FU Call Status: Today's TOC FU Call Status:: Unsuccessful Call (1st Attempt) Unsuccessful Call (1st Attempt) Date: 01/13/23  Attempted to reach the patient regarding the most recent Inpatient/ED visit. Note [facility].  Follow Up Plan: Additional outreach attempts will be made to reach the patient to complete the Transitions of Care (Post Inpatient/ED visit) call.   Susa Loffler , BSN, RN Care Management Coordinator [place]   [facility] [email or web address] Direct Dial: [phone]

## 2023-01-14 ENCOUNTER — Telehealth: Payer: Self-pay

## 2023-01-14 NOTE — Transitions of Care (Post Inpatient/ED Visit) (Signed)
01/14/2023  Name: Karen Clarke MRN: [id number] DOB: Jun 06, 1985  Today's TOC FU Call Status: Today's TOC FU Call Status:: Successful TOC FU Call Completed Unsuccessful Call (1st Attempt) Date: 01/14/23 Patient's Name and Date of Birth confirmed.  Transition Care Management Follow-up Telephone Call Date of Discharge: 01/10/23 Discharge Facility: Other [facility]) Name of Other ([facility]) Discharge Facility: [facility] Type of Discharge: Inpatient Admission Primary Inpatient Discharge Diagnosis:: Acute Osteomyelitis of vertebra, thoracic region How have you been since you were released from the hospital?: Worse Any questions or concerns?: Yes Patient Questions/Concerns:: Patient was distraught, crying Stated " I can't express enough how much I am not OK" I told them not to send me home I can't do it " Stated she was about to pull out the PICC line. Patient Questions/Concerns Addressed: Other: (Called [facility]  patient had appt today, unable to reach a live person, [employer] Spoke to Vernona Rieger, provided informationrequested her CM follow-up with her. Call placed to [facility]. LM with after hrs ctr)  Items Reviewed: Did you receive and understand the discharge instructions provided?: Yes (Feels she should not have been discharfed, feels she csn ot manage infusions at home, needs more help and care,) Medications obtained,verified, and reconciled?: No Medications Not Reviewed Reasons:: Advised Patient to Call Provider Office (She is followed by [facility], she was to upset, difficult to understand , sobbing) Any new allergies since your discharge?: No Dietary orders reviewed?: Yes Type of Diet Ordered:: reg Do you have support at home?: Yes People in Home: significant other Name of Support/Comfort Primary Source: She has BF but stated he can not take any more time off from work, his mother can not take  more time. She will be alone and said  she can't handle it all  Medications Reviewed Today: Medications Reviewed Today   Medications were not reviewed in this encounter     Home Care and Equipment/Supplies: Were Home Health Services Ordered?: Yes Name of Home Health Agency:: [facility] Has Agency set up a time to come to your home?: Yes First Home Health Visit Date: 01/10/23 Any new equipment or medical supplies ordered?: Yes Name of Medical supply agency?: [facility] Were you able to get the equipment/medical supplies?: Yes Do you have any questions related to the use of the equipment/supplies?: Yes What questions do you have?: See previous note  Functional Questionnaire: Do you need assistance with bathing/showering or dressing?: No Do you need assistance with meal preparation?: No Do you need assistance with eating?: No Do you have difficulty maintaining continence: No Do you need assistance with getting out of bed/getting out of a chair/moving?: No Do you have difficulty managing or taking your medications?: Yes  Follow up appointments reviewed: PCP Follow-up appointment confirmed?: Yes MD Provider Line Number:[phone] Given: No Date of PCP follow-up appointment?: 01/16/23 Follow-up Provider: Raynelle Fanning [facility] Follow-up appointment confirmed?: Yes Date of Specialist follow-up appointment?: 01/13/23 Follow-Up Specialty Provider:: Infectious Disease Do you need transportation to your follow-up appointment?: No Do you understand care options if your condition(s) worsen?: No-reviewed care options from [facility]  SDOH Interventions Today    Flowsheet Row Most Recent Value  SDOH Interventions   Food Insecurity Interventions Other (Comment)  [[employer] Plan CM will address]  Housing Interventions Intervention Not Indicated  Transportation Interventions Patient Resources (Friends/Family), Other (Comment)  [Tailored health Plan CM and SW will assist]  Utilities Interventions Intervention  Not Indicated     Patient was  overwhelmed, she was sobbing barely able to talk . [facility]. She expressed she needed help, and was not ok with her care, the plan , or being home alone she was scared and" Not OK"I attempted to call spine center , reached the scheduling office was transferred to office was not able to speak to live person despite multiple attempt and transfer from scheduler . Patient has a [employer] plan which offers CM and SW to address Nursing and SDOH needs Call placed and info provided to [facility] ( [phone])Spoke with Vernona Rieger.  Call placed to [facility]( [phone]) Spoke to [place]. They are acutely aware of patient her concerns and needs. A Nurse has visited her daily since discharge, Patient has  made multiple calls to Infusion Center and was  seen in person 01/13/23 due to concerns she dislodged her PICC line placement was verified. Infusion Center recommended she contact her ID provider [phone]) if she felt she could not manage IV ABT at home or return to the ER Call placed to [facility] in [place] ( [phone]) Spoke  to Jola Babinski) after hours service . They are open 5am-1pm. Provided patient info and requested her CM/ SW touch base with her.  Attempted to notify her Care team and will defer to Her [facility] CM plan moving forward to avoid duplication of care  Susa Loffler , BSN, RN Care Management Coordinator [place]   [facility] [email or web address] Direct Dial: [phone]

## 2023-01-15 ENCOUNTER — Ambulatory Visit: Payer: MEDICAID | Attending: Cardiology | Admitting: Cardiology

## 2023-01-16 ENCOUNTER — Encounter: Payer: Self-pay | Admitting: Cardiology

## 2023-01-16 ENCOUNTER — Ambulatory Visit: Payer: MEDICAID | Admitting: Nurse Practitioner

## 2023-01-16 DIAGNOSIS — M4620 Osteomyelitis of vertebra, site unspecified: Secondary | ICD-10-CM | POA: Insufficient documentation

## 2023-01-16 HISTORY — DX: Osteomyelitis of vertebra, site unspecified: M46.20

## 2023-01-16 NOTE — Progress Notes (Deleted)
There were no vitals taken for this visit.   Subjective:    Patient ID: Karen Clarke, female    DOB: 11/17/1985, 37 y.o.   MRN: [id number]  HPI: Karen Clarke is a 37 y.o. female  No chief complaint on file.  Hospital follow up/ osteomyelitis thoracic:hospital note: Admit Date: 12/24/2022 Discharge Date: 01/10/2023  Admitting Physician: Helmut Muster, MD Discharge Physician: Franki Cabot, MD  Primary Care Provider: Dwana Melena, NP, Phone [phone]  Discharge Destination: Home  Admission Diagnoses:  Acute osteomyelitis of thoracic spine (CMS/HHS-HCC) [M46.24] Chest pain, unspecified type [R07.9] Acute midline low back pain without sciatica [M54.50] Chest pain due to myocardial ischemia, unspecified ischemic chest pain type [I25.9]  Discharge Diagnoses:  Principal Problem: Acute upper back pain Active Problems: Severe opioid use disorder (CMS/HHS-HCC) Ischemic necrosis of finger (CMS/HHS-HCC) Resolved Problems: * No resolved hospital problems. * Primary Diagnosis: Admitted for T-T6 osteomyelitis   Changes Made (with rationale):  Discharged on vancomycin and ceftriaxone until 02/04/23 Increased Gabapentin to 600 mg TID D/c'd on celebrex BID Discharged on short taper of po morphine(15 mg q8h x 2 days, 15 mg q12h x 2 days, then 15 mg q24h x 1 day)  To-Do List (incidental findings, follow-up studies, etc.): Continued f/u with ortho spine, ID and further imaging per their recommendations Needs ongoing dental f/u Repeat iron studies after acute infectioni resolves to determine IV iron Needs ongoing wound care  Anticipatory Guidance for Outpatient Care:  See above. Needs ongoing f/u for OUD as well.   Results Pending at Discharge:  16s, final cultures Please see phone numbers at end of this summary for lab contact information.   Follow-up/Care Transition Plan: Sched. appts: Future Appointments  Date Time Provider Department Center  01/14/2023  9:30 AM Passias, Elease Hashimoto, MD [facility]  01/15/2023 9:00 AM Vivianne Spence, MD [facility]  01/16/2023 11:00 AM Gerda Diss, MD [facility]   Follow-up info: Dwana Melena, NP [address] [facility] [place] [address] [phone]  Dwana Melena, NP [address] [facility] [place] [address] [phone]     Allergies/Intolerances:  Allergies  Allergen Reactions  Cefazolin Rash  Tolerated cefepime 02/20/20 Negative penicillin skin test 12/27/2020  Zostrix [Capsaicin] Hives and Itching    New Adverse Drug Events: none  Medication changes: med rec completed  Medications:  Current Discharge Medication List    START taking these medications  Details  morphine (MSIR) 15 MG immediate release tablet Take 1 tablet (15 mg total) by mouth every 8 (eight) hours as needed for 2 days, THEN 1 tablet (15 mg total) every 12 (twelve) hours as needed for 2 days, THEN 1 tablet (15 mg total) once daily as needed for up to 1 day. Qty: 11 tablet, Refills: 0   nicotine (NICODERM CQ) 14 mg/24 hr patch Place 1 patch onto the skin once daily for 14 days Qty: 14 patch, Refills: 0   ondansetron (ZOFRAN-ODT) 4 MG disintegrating tablet Take 1 tablet (4 mg total) by mouth every 8 (eight) hours as needed for up to 7 days Qty: 20 tablet, Refills: 0   polyethylene glycol (MIRALAX) packet Take 1 packet (17 g total) by mouth once daily as needed Mix in 4-8ounces of fluid prior to taking.   sennosides-docusate (SENOKOT-S) 8.6-50 mg tablet Take 2 tablets by mouth 2 (two) times daily   sodium chloride 0.9 % SolP 100 mL with cefTRIAXone  2 gram SolR 2 g IVPB Inject 2 g into the vein daily for 360 days   vancomycin in 0.9% sodium chloride (VANCOCIN) 1 gram/250 mL IVPB Inject 250 mLs (1 g total) into the  vein every 12 (twelve) hours for 26 days   celecoxib (CELEBREX) 200 MG capsule Take 1 capsule (200 mg total) by mouth 2 (two) times daily for 30 days Qty: 60 capsule, Refills: 0     CONTINUE these medications which have CHANGED  Details  acetaminophen (TYLENOL) 325 MG tablet Take 3 tablets (975 mg total) by mouth 3 (three) times daily for 10 days Qty: 30 tablet, Refills: 0   gabapentin (NEURONTIN) 300 MG capsule Take 2 capsules (600 mg total) by mouth 3 (three) times daily Qty: 180 capsule, Refills: 0     CONTINUE these medications which have NOT CHANGED  Details  albuterol MDI, PROVENTIL, VENTOLIN, PROAIR, HFA 90 mcg/actuation inhaler Inhale 2 inhalations into the lungs every 6 (six) hours as needed for Wheezing   clobetasoL (CLOBEX) 0.05 % lotion Apply 1 Application topically once daily   melatonin 3 mg tablet Take 1 tablet (3 mg total) by mouth at bedtime   methadone (DOLOPHINE) 5 mg/5 mL solution Take 120 mg by mouth once daily   pantoprazole (PROTONIX) 40 MG DR tablet Take 40 mg by mouth 2 (two) times daily as needed   QUEtiapine (SEROQUEL) 100 MG tablet Take 1 tablet (100 mg total) by mouth at bedtime Qty: 30 tablet, Refills: 0  Associated Diagnoses: Insomnia, unspecified type   naloxone (NARCAN) 4 mg/actuation nasal spray Place 1 spray (4 mg total) into one nostril as directed For suspected opioid overdose, may repeat after 3 minutes if no or minimal response. Qty: 1 each, Refills: 0   silver sulfate-foam bandage 6 X 6 " Bndg Uad, daily dressing changes. Qty: 30 each, Refills: 0  Associated Diagnoses: Wound of left leg, subsequent encounter     STOP taking these medications   ibuprofen (MOTRIN) 200 MG tablet      Brief History of Present Illness:  Per H&P: Karen Clarke is a 37 y.o. female with a complex past medical history notable for opiate use disorder on methadone, cocaine use, multiple prior infections with MRSA, MSSA, Enterococcus, candidemia, with prior  MRSA mitral valve endocarditis, pulmonary emboli not on anticoagulation (thought could likely be septic emboli), who presents with 2 months of progressive upper back pain and fevers.  Patient states that she was admitted in February, felt like she had healed and then a couple months ago started having chest and back pain particularly with a deep breath, went to [facility] and did some chest imaging with a CT PE, saw some atelectasis versus scarring, was sent home. Went to PCP and started treating for GERD and that didn't help either. Thought could be costochondritis, but as needed NSAIDs have not been helping. Predominantly in between her shoulder blades, started under her left shoulder blade and then moved to the middle of her back, and now radiates around both sides of her chest left greater than right. The pain has been so bad that she has been starting to have trouble even walking without getting short of breath due to splinting and severe pain. Went to the grocery store and walked to the freezer section and felt like she was going to pass out, it's mainly because it hurts so bad she feels like she can't take a deep breath.   Significant positional component. She states she has had to  sleep sitting up for the last month because pain is so bad when she lies flat, particularly when she lays on her back. No numbness or tingling in hands, however has been having difficulty holding her arms out because she feels like she's off balance when she holds her arms out straight like she is going to fall. Has been having temps of 100-101 with ibuprofen and tylenol, has been going off and on for the last month. Thermometer died a week ago but has intermittently felt hot. Has had some chills today. Has had some intermittent vomiting - that's why they treated her for the ulcers. None in the last week.  A month ago wet the bed but that has never happened before. Otherwise no difficulties urinating, though feels like she is  voiding more frequently with less volume each void. No burning with urination. No further episodes of enuresis. Last BM was yesterday. Last time vomited was a week ago, NBNB. No changes in sensation with wiping. Stools have been black but she attributes that to taking as needed Pepto-Bismol with concern for potential GERD no falls but has had intermittently stumbled. Ibuprofen taken 6-8 200 mg every 4 hours, taking 3 325 mg tylenols q4 as needed.  She did take her methadone this morning 120 mg, gets 6 take homes but she has 1 remaining that she brought with her.  In the emergency department was noted to have severe thoracic back pain and was eventually initiated on a morphine PCA. There was concern for epidural abscess based on history so attempted to obtain MRI with sedation with ketamine which was unsuccessful. She has been started on a PCA with some improvement in her pain which is now controlled but still feels like she needs to sit up to control her pain. 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 Patient with history of recurrent MRSA infections, endocarditis, chronic skin wounds, IVDU. Found to have T5-T6 OM on MRI, no gross neurologic sensory or motor deficits. TTE and TEE without evidence of endocarditis. She underwent thoracic spine biopsy with radiology on 10/29. Cultures returned w/o culprit organism. 16s testing was performed and pending at d/c. Unclear etiology, possibly from extremity wounds vs prior infections. ID and orthopedics (spine) were consulted. No surgical interventions were indicated. She was deemed an OPAT candidate by COMET and ID. She was initially on dapto+cefepime and transitioned to IV Vancomycin 1 g q12h and IV  ceftriaxone 2 g q24h with end date of 02/04/23. She will continue to f/u with ID and ortho spine. This admission, she had significant back pain. She was on a morphine pca ~2 weeks. Acute pain was consulted and she was transitioned to oral morphine which will taper as outpatient as follows: 15 mg q8h prn x 2 days, 15 mg q12h prn x 2 days, 15 mg q24h x 1 day. Her gabapentin was increased to 600 mg TID. She was started on scheduled tylenol, lidocaine patches. She was d/c'd on celebrex with a BID ppi as well. She will need continued f/u with outpatient pain management.   # Acute on Chronic Anemia Longtime history of anemia which is thought predominantly due to anemia of chronic disease however looks like she has severe iron deficiency anemia. Hemoglobin on admission 6.9 from baseline 7.5-8, no blood  loss symptoms, received 2 unit (washed given igA deficiency).  # Severe OUD in remission COMET team consulted. Patient currently goes to [facility] in [place], pharmacy confirmed dose of 120 mg daily. Continued while inpatient.   #Intermittent chest discomfort Discussed w/ID need for CT CAP and if it would change management. They ultimately felt this would not alter management at this time and did not recommend getting CT CAP at this time.  #Oral Pain c/f dental caries Known dental care needs. Is planning on following up with dentistry as outpatient. Unfortunately can not do x-ray orthopanogram inpatient, patient is not comfortable laying flat in CT scanner. Needs f/u as outpatient.  # Cocaine Use Encourage cessation  # Anxiety # Insomnia Continue home Seroquel, melatonin  # Right arm wound # LLE Wound # Right necrotic finger wound Patient notes history of pyoderma gangrenosum, however, prior dermatology evaluation and pathology were not consistent with this diagnosis. Possible that it could be related to xylazine. Wound care consulted. Continued recommendations as noted below.   # Concern for  lupus Unclear diagnosis. Being evaluated by her outpatient providers  #Vaping -nicotine patch, lozenges prn. encourage cessation  #IgA deficiency Washed blood given increased risk of anaphylaxis  Surgeries and Procedures Performed:  Biopsy   _____________________  Discharge Exam:  BP 126/66 (BP Location: Right upper arm, Patient Position: Lying)  Pulse 60  Temp 36.8 C (98.2 F) (Oral)  Resp 16  Ht 160 cm (5' 2.99")  Wt 97.3 kg (214 lb 6.4 oz)  LMP 11/26/2022 (Approximate)  SpO2 96%  BMI 37.99 kg/m  O2 Device: Nasal cannula (01/01/23 0830) GEN: well-appearing in nad HEENT: poor dentition, multiple dental carries CV: RRR no m/r/g PULM: CTAB, no wheezes/rales/rhonchi or crackles ABDO: +BS, soft nt/nd w/o rebound or guarding.  EXTREM: trace bilateral LE edema, wrapping of LE wounds NEURO: A&Ox4, CN II-XII grossly normal, moving all extremities PSYCH: normal mood, pleasant affect  Pertinent Lab Testing: Recent Labs  Lab 01/06/23 0359 01/08/23 0415 01/09/23 0449  NA 141 139 139  K 3.9 3.7 4.0  CL 103 105 106  CO2 28 26 24   BUN 9 10 9   CREATININE 0.8 0.9 0.9  GLUCOSE 93 93 84  CALCIUM 8.6* 8.8 8.6*   Recent Labs  Lab 01/05/23 0446 01/08/23 0415  AST 23 17  ALT 25 17  ALKPHOS 144* 136*  TBILI 0.4 0.3*   Recent Labs  Lab 01/05/23 0446 01/06/23 0357 01/08/23 0415  WBC 5.5 5.7 5.7  HGB 8.3* 7.6* 7.8*  HCT 27.9* 26.0* 26.9*  PLT 397 332 309   No results for input(s): "APTT", "INR" in the last 168 hours.   Other Pertinent Labs:  Pathology T5-6 bone biopsy: A. T5-6 bone, biopsy:  Acute and chronic osteomyelitis. Correlation with concurrent culture results is recommended.   Micro:  Lab Results  Component Value Date  BLDCULT No growth detected. 12/24/2022  BLDCULT No growth detected. 12/24/2022   No results found for: "[facility]" No results found for: "[facility]" Lab Results  Component Value Date  TISSUEC No aerobic or anaerobic growth 12/31/2022    Lab Results  Component Value Date  OTHERC (!) 04/03/2022  1+ Methicillin Resistant Staphylococcus aureus (MRSA)  OTHERC 1+ Staphylococcus aureus (!) 04/03/2022   Lab Results  Component Value Date  LABGRAM No organisms seen 12/31/2022  LABGRAM No White Blood Cells 12/31/2022    Pertinent Imaging:   MRI lumbar spine with and without contrast  Result Date: 12/25/2022 MRI THORACIC SPINE WITHOUT AND WITH CONTRAST  MRI LUMBAR SPINE WITHOUT AND WITH CONTRAST INDICATION: Osteomyelitis, thoracic, M54.50 Low back pain, unspecified COMPARISON: CT chest 04/08/2022. TECHNIQUE/PROTOCOL: Infection/bone metastasis protocol thoracic and lumbar spine MRI performed without and with contrast administration. FINDINGS: Anatomical variants: Conventional spinal numbering. Prior surgery: None. Discitis osteomyelitis involving the C5-6 disc space and adjacent vertebral bodies with associated T1 hypointensity, T2 hyperintense edema like signal and enhancement of the T5 and T6 vertebral bodies. There is approximately 30% height loss of the anterior C5 vertebral body and 20% height loss of the mid T6 vertebral body. There is both prevertebral phlegmonous extension measuring up to 0.5 cm in AP dimension and extending from T5-6 as well as epidural phlegmonous extension measuring up to 0.4 cm in AP dimension and extending from T4-5 through T6. No discrete epidural abscess. There is resultant mild spinal canal stenosis and slight impression of the ventral spinal cord. No signal abnormality of the underlying spinal cord. Spinal Cord and Cauda Equina: Visualized portions of the spinal cord is normal in morphology and signal. Normal appearance of the cauda equina. Conus medullaris: terminates at approximately L1. Alignment: No traumatic listhesis. Bone marrow signal: No suspicious lesions. Paraspinal soft tissues: Unremarkable Visualized organs and surrounding viscera: Small bilateral pleural effusions. Subcentimeter T2 hyperintense  nonenhancing lesion in the right kidney is incompletely characterized but favored to represent a benign renal cyst. Degenerative changes: Thoracic spine: No high-grade spinal canal or neuroforaminal stenosis. T12-L1: Unremarkable. L1-L2: Unremarkable. L2-L3: Unremarkable. L3-L4: Unremarkable. L4-L5: Unremarkable. L5-S1: Unremarkable. IMPRESSION: Discitis osteomyelitis involving the T5-6 disc as well as T5 and T6 vertebral bodies with epidural phlegmonous extension resulting in mild spinal canal stenosis. No discrete epidural abscess. Findings discussed with DEREK CLIFFORD Ashok Cordia by Jettie Pagan at 12/25/2022 2:43 PM. Electronically Reviewed by: Jettie Pagan, MD, [facility] Electronically Reviewed on: 12/25/2022 3:50 PM I have reviewed the images and concur with the above findings. Electronically Signed by: Christella Hartigan, MD, [facility] Electronically Signed on: 12/25/2022 4:07 PM  MRI thoracic spine with and without contrast  Result Date: 12/25/2022 MRI THORACIC SPINE WITHOUT AND WITH CONTRAST MRI LUMBAR SPINE WITHOUT AND WITH CONTRAST INDICATION: Osteomyelitis, thoracic, M54.50 Low back pain, unspecified COMPARISON: CT chest 04/08/2022. TECHNIQUE/PROTOCOL: Infection/bone metastasis protocol thoracic and lumbar spine MRI performed without and with contrast administration. FINDINGS: Anatomical variants: Conventional spinal numbering. Prior surgery: None. Discitis osteomyelitis involving the C5-6 disc space and adjacent vertebral bodies with associated T1 hypointensity, T2 hyperintense edema like signal and enhancement of the T5 and T6 vertebral bodies. There is approximately 30% height loss of the anterior C5 vertebral body and 20% height loss of the mid T6 vertebral body. There is both prevertebral phlegmonous extension measuring up to 0.5 cm in AP dimension and extending from T5-6 as well as epidural phlegmonous extension measuring up to 0.4 cm in AP dimension and extending from T4-5 through T6. No  discrete epidural abscess. There is resultant mild spinal canal stenosis and slight impression of the ventral spinal cord. No signal abnormality of the underlying spinal cord. Spinal Cord and Cauda Equina: Visualized portions of the spinal cord is normal in morphology and signal. Normal appearance of the cauda equina. Conus medullaris: terminates at approximately L1. Alignment: No traumatic listhesis. Bone marrow signal: No suspicious lesions. Paraspinal soft tissues: Unremarkable Visualized organs and surrounding viscera: Small bilateral pleural effusions. Subcentimeter T2 hyperintense nonenhancing lesion in the right kidney is incompletely characterized but favored to represent a benign renal cyst. Degenerative changes: Thoracic spine: No high-grade spinal canal or neuroforaminal stenosis. T12-L1: Unremarkable.  L1-L2: Unremarkable. L2-L3: Unremarkable. L3-L4: Unremarkable. L4-L5: Unremarkable. L5-S1: Unremarkable. IMPRESSION: Discitis osteomyelitis involving the T5-6 disc as well as T5 and T6 vertebral bodies with epidural phlegmonous extension resulting in mild spinal canal stenosis. No discrete epidural abscess. Findings discussed with DEREK CLIFFORD Ashok Cordia by Jettie Pagan at 12/25/2022 2:43 PM. Electronically Reviewed by: Jettie Pagan, MD, [facility] Electronically Reviewed on: 12/25/2022 3:50 PM I have reviewed the images and concur with the above findings. Electronically Signed by: Christella Hartigan, MD, [facility] Electronically Signed on: 12/25/2022 4:07 PM  X-ray chest PA and lateral  Result Date: 12/24/2022 Examination: XR CHEST PA AND LATERAL Patient Name: Alena Mell Reason provided in the MEDICAL RECORD NUMBER" Other ( add clinical information to comment box below), R07.9 Chest pain, unspecified" The report below contains medical terminology and recommendations which are best discussed with your ordering provider. COMPARISON: April 07, 2022 through June 07, 2021 FINDINGS/IMPRESSION: Degraded  by patient's inability to elevate arms * Cardiomediastinal contours unchanged * Lung volumes remain low-normal * Minimal but new right basilar subsegmental atelectasis * Minimal left basilar pleural/parenchymal scar unchanged * No pleural effusions or pneumothorax Electronically Signed by: Erenest Blank, MD, [facility] Electronically Signed on: 12/24/2022 8:12 AM  _____________________  Code Status: Full Code Goals of care were not addressed during this admission.   Status on Discharge:  Current activity: Walks occasionally (01/10/23 0701) Current mobility: No limitation (01/10/23 0701)  Activity Recommendation: activity as tolerated and no driving while on morphine  Other Discharge Instructions: Services setup at discharge: Home infusion for abx Tubes/lines at discharge: PICC line  Diet: Diet regular  Wound Care Order Instructions    Wound Care As directed  Comments: Clean with vashe moistened gauze and allow to dry. Cover with Abd pad or mepilex foam. Change daily amd PRN  Question Answer Comment  Body Site Right arm  Instructions Patient can change indpendently    Wound Care As directed  Comments: Clean with vashe moistened gauze and allow to dry. Apply Vaseline gauze to the wound and cover with ABD pad or mepilex foam. Change daily.  Question Answer Comment  Body Site Left leg  Instructions Patient cna change indpendently    Wound Care / Dressing Change Every 12 hours  Question Answer Comment  Body Site Left leg, right arm, finger  Action Other Patient can direct  Pack Wound No   Had follow up appointment with [facility]:  01/14/2023 plan of care: MRI thoracic and lumbar repeat in 8 weeks and EOS repeat. ID follow up Rheumatology follow-up  Seen by primary care in [facility]: 01/15/2023 Seen by infectious disease 01/16/2023  Issues concerning treatment and diagnosis were discussed with the patient. There are no barriers understanding the plan of treatment.  Explanation was well received by patient and/or family who then verbalized understanding, and updated reconciled list of the patient's medications was reviewed with and provided to the patient.   Relevant past medical, surgical, family and social history reviewed and updated as indicated. Interim medical history since our last visit reviewed. Allergies and medications reviewed and updated.  Review of Systems  Constitutional: Negative for fever or weight change.  Respiratory: Negative for cough and shortness of breath.   Cardiovascular: Negative for chest pain or palpitations.  Gastrointestinal: Negative for abdominal pain, no bowel changes.  Musculoskeletal: Negative for gait problem or joint swelling.  Skin: Negative for rash.  Neurological: Negative for dizziness or headache.  No other specific complaints in a complete review of systems (except as  listed in HPI above).      Objective:    There were no vitals taken for this visit.  Wt Readings from Last 3 Encounters:  11/22/22 205 lb 3.2 oz (93.1 kg)  11/05/22 199 lb 3.2 oz (90.4 kg)  10/14/22 196 lb 12.8 oz (89.3 kg)    Physical Exam  Constitutional: Patient appears well-developed and well-nourished. Obese *** No distress.  HEENT: head atraumatic, normocephalic, pupils equal and reactive to light, ears ***, neck supple, throat within normal limits Cardiovascular: Normal rate, regular rhythm and normal heart sounds.  No murmur heard. No BLE edema. Pulmonary/Chest: Effort normal and breath sounds normal. No respiratory distress. Abdominal: Soft.  There is no tenderness. Psychiatric: Patient has a normal mood and affect. behavior is normal. Judgment and thought content normal.   Results for orders placed or performed in visit on 11/05/22  Urine Culture   Specimen: Urine  Result Value Ref Range   MICRO NUMBER: [id number]    SPECIMEN QUALITY: Adequate    Sample Source URINE    STATUS: FINAL    Result: No Growth   POCT  urinalysis dipstick  Result Value Ref Range   Color, UA Yellow    Clarity, UA Cloudy    Glucose, UA Negative Negative   Bilirubin, UA Negative    Ketones, UA Negative    Spec Grav, UA 1.020 1.010 - 1.025   Blood, UA Large    pH, UA 6.0 5.0 - 8.0   Protein, UA Negative Negative   Urobilinogen, UA 0.2 0.2 or 1.0 E.U./dL   Nitrite, UA Negative    Leukocytes, UA Moderate (2+) (A) Negative   Appearance Yellow    Odor Foul       Assessment & Plan:   Problem List Items Addressed This Visit   None    Follow up plan: No follow-ups on file.

## 2023-01-17 ENCOUNTER — Other Ambulatory Visit: Payer: Self-pay | Admitting: Nurse Practitioner

## 2023-01-17 DIAGNOSIS — F4310 Post-traumatic stress disorder, unspecified: Secondary | ICD-10-CM

## 2023-01-17 DIAGNOSIS — F603 Borderline personality disorder: Secondary | ICD-10-CM

## 2023-01-17 NOTE — Telephone Encounter (Signed)
Requested medication (s) are due for refill today: yes  Requested medication (s) are on the active medication list: yes  Last refill:  10/14/22  Future visit scheduled: yes  Notes to clinic:  Unable to refill per protocol, cannot delegate.      Requested Prescriptions  Pending Prescriptions Disp Refills   QUEtiapine (SEROQUEL) 100 MG tablet [Pharmacy Med Name: QUETIAPINE 100MG  TABLETS] 90 tablet 1    Sig: TAKE 1 TABLET(100 MG) BY MOUTH AT BEDTIME     Not Delegated - Psychiatry:  Antipsychotics - Second Generation (Atypical) - quetiapine Failed - 01/17/2023  8:20 AM      Failed - This refill cannot be delegated      Failed - TSH in normal range and within 360 days    No results found for: "TSH", "POCTSH", "TSHREFLEX"       Failed - Lipid Panel in normal range within the last 12 months    No results found for: "CHOL", "POCCHOL", "CHOLTOT" No results found for: "LDLCALC", "LDLC", "HIRISKLDL", "POCLDL", "LDLDIRECT", "REALLDLC", "TOTLDLC" No results found for: "HDL", "POCHDL" No results found for: "TRIG", "POCTRIG"       Passed - Completed PHQ-2 or PHQ-9 in the last 360 days      Passed - Last BP in normal range    BP Readings from Last 1 Encounters:  11/22/22 120/78         Passed - Last Heart Rate in normal range    Pulse Readings from Last 1 Encounters:  11/22/22 95         Passed - Valid encounter within last 6 months    Recent Outpatient Visits           1 month ago Generalized abdominal pain   [facility] Della Goo F, FNP   2 months ago Nausea and vomiting, unspecified vomiting type   [facility] Danelle Berry, PA-C   3 months ago PTSD (post-traumatic stress disorder)   [facility] Berniece Salines, FNP       Future Appointments             In 4 weeks Berniece Salines, FNP [facility]            Passed - CBC within normal limits and  completed in the last 12 months    WBC  Date Value Ref Range Status  03/19/2022 9.2 4.0 - 10.5 K/uL Final   RBC  Date Value Ref Range Status  03/19/2022 3.19 (L) 3.87 - 5.11 MIL/uL Final   RBC.  Date Value Ref Range Status  03/19/2022 3.37 (L) 3.87 - 5.11 MIL/uL Final   Hemoglobin  Date Value Ref Range Status  03/19/2022 7.4 (L) 12.0 - 15.0 g/dL Final    Comment:    Reticulocyte Hemoglobin testing may be clinically indicated, consider ordering this additional test [id number]    HCT  Date Value Ref Range Status  03/19/2022 24.0 (L) 36.0 - 46.0 % Final   MCHC  Date Value Ref Range Status  03/19/2022 30.8 30.0 - 36.0 g/dL Final   [facility]  Date Value Ref Range Status  03/19/2022 23.2 (L) 26.0 - 34.0 pg Final   MCV  Date Value Ref Range Status  03/19/2022 75.2 (L) 80.0 - 100.0 fL Final   No results found for: "PLTCOUNTKUC", "LABPLAT", "POCPLA" RDW  Date Value Ref Range Status  03/19/2022 17.2 (H) 11.5 - 15.5 % Final  Passed - CMP within normal limits and completed in the last 12 months    Albumin  Date Value Ref Range Status  03/18/2022 3.1 (L) 3.5 - 5.0 g/dL Final   Alkaline Phosphatase  Date Value Ref Range Status  03/18/2022 179 (H) 38 - 126 U/L Final   ALT  Date Value Ref Range Status  03/18/2022 34 0 - 44 U/L Final   AST  Date Value Ref Range Status  03/18/2022 24 15 - 41 U/L Final   BUN  Date Value Ref Range Status  03/19/2022 10 6 - 20 mg/dL Final   Calcium  Date Value Ref Range Status  03/19/2022 8.0 (L) 8.9 - 10.3 mg/dL Final   Calcium, Ion  Date Value Ref Range Status  02/21/2009 0.79 (L) 1.12 - 1.32 mmol/L Final   CO2  Date Value Ref Range Status  03/19/2022 21 (L) 22 - 32 mmol/L Final   TCO2  Date Value Ref Range Status  02/21/2009 15 0 - 100 mmol/L Final   Creatinine  Date Value Ref Range Status  04/03/2021 172.4 20.0 - 300.0 mg/dL Final   Creatinine, Ser  Date Value Ref Range Status  03/19/2022 0.69 0.44 - 1.00 mg/dL  Final   Glucose, Bld  Date Value Ref Range Status  03/19/2022 109 (H) 70 - 99 mg/dL Final    Comment:    Glucose reference range applies only to samples taken after fasting for at least 8 hours.   Glucose-Capillary  Date Value Ref Range Status  03/19/2022 123 (H) 70 - 99 mg/dL Final    Comment:    Glucose reference range applies only to samples taken after fasting for at least 8 hours.   Potassium  Date Value Ref Range Status  03/19/2022 3.2 (L) 3.5 - 5.1 mmol/L Final   Sodium  Date Value Ref Range Status  03/19/2022 136 135 - 145 mmol/L Final   Total Bilirubin  Date Value Ref Range Status  03/18/2022 0.3 0.3 - 1.2 mg/dL Final   Protein, ur  Date Value Ref Range Status  03/17/2022 30 (A) NEGATIVE mg/dL Final   Protein, UA  Date Value Ref Range Status  11/05/2022 Negative Negative Final   Total Protein  Date Value Ref Range Status  03/18/2022 7.1 6.5 - 8.1 g/dL Final   GFR, Estimated  Date Value Ref Range Status  03/19/2022 >60 >60 mL/min Final    Comment:    (NOTE) Calculated using the CKD-EPI Creatinine Equation (2021)

## 2023-01-28 DIAGNOSIS — E440 Moderate protein-calorie malnutrition: Secondary | ICD-10-CM

## 2023-01-28 HISTORY — DX: Moderate protein-calorie malnutrition: E44.0

## 2023-01-29 DIAGNOSIS — E559 Vitamin D deficiency, unspecified: Secondary | ICD-10-CM | POA: Insufficient documentation

## 2023-01-29 HISTORY — DX: Vitamin D deficiency, unspecified: E55.9

## 2023-02-10 ENCOUNTER — Encounter: Payer: Self-pay | Admitting: Internal Medicine

## 2023-02-13 NOTE — Progress Notes (Unsigned)
There were no vitals taken for this visit.   Subjective:    Patient ID: Karen Clarke, female    DOB: May 03, 1985, 37 y.o.   MRN: [id number]  HPI: Rayden Turro is a 37 y.o. female  No chief complaint on file.   Discussed the use of AI scribe software for clinical note transcription with the patient, who gave verbal consent to proceed.  History of Present Illness        Reviewed recent hospital admission notes: [facility] Discharge Summary  Admit Date: 01/17/2023 Discharge Date: 01/30/2023  Admitting Physician: Hewitt Shorts, MD Discharge Physician: Dr. Justice Deeds  Primary Care Provider: Dwana Melena, NP, Phone [phone]  Discharge Destination: Home  Admission Diagnoses: Vertebral osteomyelitis (CMS/HHS-HCC) [M46.20]  Discharge Diagnoses: Principal Problem: Vertebral osteomyelitis (CMS/HHS-HCC) Active Problems: Acute upper back pain Moderate protein-calorie malnutrition (CMS-HCC) Vitamin D deficiency Resolved Problems: * No resolved hospital problems. * Primary Diagnosis: Admitted for worsening neck/back pain and difficulty with home antibiotics, following recent admission for T5-T6 osteomyelitis  Changes Made (with rationale): Finishing antibiotic course with oral Ciprofloxacin and Daptomycin infusion Pain management with oral morphine (MSIR) - 15 pills given to allow for opioid weaning and prevent withdrawal symptoms and rehospitalization. Multimodal pain control strategy with Tylenol, ibuprofen, gabapentin, Robaxin, lidocaine patches, and Voltaren gel.  To-Do List (incidental findings, follow-up studies, etc.): IV Fe when done with antibiotic treatment Continued f/u with ortho spine, ID and further imaging per their recommendations Ongoing dental follow up Ongoing wound care  Anticipatory Guidance for Outpatient Care: Follow up imaging and evaluation after completing antibiotic therapy to consider next steps in  thor Ongoing outpatient smoking cessation counseling & OUD care   Results Pending at Discharge: Unresulted Labs (From admission, onward)  None    Please see phone numbers at end of this summary for lab contact information.  Follow-up/Care Transition Plan: Sched. appts: Future Appointments Date Time Provider Department Center 02/03/2023 1:30 PM Vivianne Spence, MD [facility] 02/17/2023 10:30 PM [facility]  Follow-up info: No follow-up provider specified.   Allergies/Intolerances: Allergies Allergen Reactions Cefazolin Rash Tolerated cefepime 02/20/20 Negative penicillin skin test 12/27/2020 Zostrix [Capsaicin] Hives and Itching   New Adverse Drug Events: none  Medications: Current Discharge Medication List   START taking these medications Details ciprofloxacin HCl (CIPRO) 750 MG tablet Take 1 tablet (750 mg total) by mouth every 12 (twelve) hours for 5 days Qty: 10 tablet, Refills: 0  DAPTOmycin (CUBICIN) injection (ADULT IVP ONLY) Inject 571.2 mg into the vein daily Pharmacy to determine diluent, concentration, and rate of administration.  diclofenac (VOLTAREN) 1 % topical gel Apply 2 g topically 4 (four) times daily as needed (Pain) Qty: 100 g, Refills: 0  lidocaine (SALONPAS) 4 % patch Place 2 patches onto the skin daily for 30 days Apply patch to the most painful area for up to 12 hours in a 24 hours period. Qty: 60 patch, Refills: 0  methocarbamoL (ROBAXIN) 750 MG tablet Take 1 tablet (750 mg total) by mouth 3 (three) times daily for 10 days Qty: 30 tablet, Refills: 0  morphine (MSIR) 15 MG immediate release tablet Take 1 tablet (15 mg total) by mouth every 6 (six) hours as needed for Pain for up to 5 days Qty: 15 tablet, Refills: 0  heparin sodium,porcine/PF (HEPARIN FLUSH, PORCINE,, PF,) 1 unit/mL injection 5 mLs (5 Units total) by Intracatheter route as directed for [facility] to determine frequency  based  on protocol and/or medication administration schedule. Qty: 1 each, Refills: 0  !! sodium chloride 0.9% flush injection 10 mLs by Intracatheter route as directed for [facility] to determine frequency based on protocol and/or medication administration schedule. Qty: 1 each, Refills: 0  !! - Potential duplicate medications found. Please discuss with provider.   CONTINUE these medications which have CHANGED Details acetaminophen (TYLENOL) 325 MG tablet Take 3 tablets (975 mg total) by mouth 3 (three) times daily for 10 days  albuterol MDI, PROVENTIL, VENTOLIN, PROAIR, HFA 90 mcg/actuation inhaler Inhale 2 inhalations into the lungs every 6 (six) hours as needed for Wheezing  gabapentin (NEURONTIN) 300 MG capsule Take 3 capsules (900 mg total) by mouth 3 (three) times daily Qty: 270 capsule, Refills: 0  ibuprofen (MOTRIN) 400 MG tablet Take 1 tablet (400 mg total) by mouth every 6 (six) hours as needed Patient reports taking 1200 mg three times daily    CONTINUE these medications which have NOT CHANGED Details heparin flush, PF,, porcine, 100 unit/mL injection Inject 5 mLs into the vein 3 (three) times daily  melatonin 3 mg tablet Take 1 tablet (3 mg total) by mouth at bedtime  methadone (DOLOPHINE) 5 mg/5 mL solution Take 120 mg by mouth once daily  nicotine polacrilex (NICORETTE) 2 MG lozenge Place 2 mg inside cheek every 2 (two) hours as needed for Smoking cessation  ondansetron (ZOFRAN-ODT) 4 MG disintegrating tablet Take 1 tablet (4 mg total) by mouth every 8 (eight) hours as needed Qty: 60 tablet, Refills: 0 Associated Diagnoses: Acute on chronic back pain  QUEtiapine (SEROQUEL) 100 MG tablet Take 1 tablet (100 mg total) by mouth at bedtime Qty: 30 tablet, Refills: 0 Associated Diagnoses: Insomnia, unspecified type  !! sodium chloride 0.9% flush injection Inject 10 mLs into the vein as directed for [facility].  naloxone (NARCAN) 4 mg/actuation nasal spray  Place 1 spray (4 mg total) into one nostril as directed For suspected opioid overdose, may repeat after 3 minutes if no or minimal response. Qty: 1 each, Refills: 0  pantoprazole (PROTONIX) 40 MG DR tablet Take 40 mg by mouth 2 (two) times daily as needed  polyethylene glycol (MIRALAX) packet Take 1 packet (17 g total) by mouth once daily as needed Mix in 4-8ounces of fluid prior to taking.  sennosides-docusate (SENOKOT-S) 8.6-50 mg tablet Take 2 tablets by mouth 2 (two) times daily  silver sulfate-foam bandage 6 X 6 " Bndg Uad, daily dressing changes. Qty: 30 each, Refills: 0 Associated Diagnoses: Wound of left leg, subsequent encounter  !! - Potential duplicate medications found. Please discuss with provider.   STOP taking these medications  celecoxib (CELEBREX) 200 MG capsule  sodium chloride 0.9 % SolP 100 mL with cefTRIAXone 2 gram SolR 2 g IVPB  vancomycin in 0.9% sodium chloride (VANCOCIN) 1 gram/250 mL IVPB  nicotine (NICODERM CQ) 14 mg/24 hr patch  tiZANidine (ZANAFLEX) 2 MG tablet     Brief History of Present Illness:  "Alaisha Botero is a 37 y.o. 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  Timeline of recent hospitalization and outpatient follow up: 10/22 presented with 2 months accelerating back pain. Bcx negative x 2 10/23  MRI T and L spine discitis/osteo involving T5-T6 disc and vertebral bodies with epidural extension resulting in mild spinal canal stenosis. 10/24 TTE no vegetations 10/29 Vertebral biopsy, cultures and 16S negative 10/30 TEE no  vegetations 11/08 Discharged on vancomycin and ceftriaxone through 12/3 11/11 ID clinic RN visit for line assessment, line in place and functioning 11/12 Spine follow-up, plan to continue antibiotics, repeat MRI in 8 weeks, TLSO. Also notified by home health that the patient was tearful and struggling with home infusions. 11/13 PCP follow-up, noted to be overwhelmed with current medical plan and reported ongoing severe pain. 11/14 ID video visit, same concerns as above, recommended for readmission  On interview, the patient is tearful and doubled over a walker and barely able to speak. Majority of the history is gathered from her partner. They report that her pain was not any better at the time of hospital discharge and that they felt uncomfortable going home, but felt rushed to leave. Since being home, her pain has not improved and if anything feels like it is spreading and getting worse. The pain is still focused between her shoulder blades, but she now describes feeling an "innertube" of pain that wraps around to the front of her chest along her bra line. She also describes pain in her shoulders, axillae, and neck. This pain is constant with a sharp component anytime she moves. Having difficulty moving her neck side to side for the past several days. She has also developed tingling in her hands, but denies shooting pains down her arms. No tingling, shooting pains, weakness, sensation changes in her lower extremities. She denies incontinence or saddle anesthesia. For pain management she has been taking Tylenol 975mg  4 times a day and ibuprofen 1200mg  3 times a day. No fevers or chills. She has been administering her home antibiotics and has not missed any doses. She says she has been struggling to get around the home due to pain and weakness and has had several tripping incidents, but no major falls/head strikes. Still sleeping nearly upright.  On further review of systems, she does endorse feeling "warm"  and flushed -this does not appear to have a temporal relation to her vancomycin administration. She also endorses nausea which she attributes to pain and intermittent vomiting last earlier today. Nonbloody and nonbilious. Also having multiple loose stools, also without blood. She has been eating and drinking. She denies chest pain, shortness of breath, cough, abdominal pain, and dysuria. She is smoking a half a pack a day and denies any alcohol or drug use. She has been able to get her methadone from the [place] facility, but states her take-home privileges were revoked due to being positive for morphine despite showing the facility her discharge paperwork stating it was prescribed.  On arrival to the floor, vitals were T 36.8 C (98.2 F), HR , BP (!) 142/95, RR 16, and satting 100 % on RA. Labs notable for Na 138, K 3.6, HCO3 23, BUN 8, sCr 0.8. CBC notable for WBC 4.7, Hgb 9.6*, plts 355. ESR 45 (40 11/10) and CRP 3.70 (4.17 11/10). Lipase 34. 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 Went home with IV dapto+cefepime and transitioned to IV Vancomycin 1 g q12h and IV ceftriaxone 2 g q24h with end date of 02/04/23. Readmitted due  to pain limiting ability to manage medications outpatient. Upon admission, switched Vancomycin to Dapto due to Vanc related infusion reaction. Switched CTX for Cipro given c/f DRESS w/ prior eosinophilia and rash. Tolerated Dapto + Cipro well. Optimized non-opioid pain management as below. Pain gradually improved w/ decreasing opioid needs and improved mobility. Plan to finish abx course at home - Cipro PO and Dapto infusion via PICC (present on discharge) through 12/3. Home Health infusion services arranged. HH infusion to pull PICC  after abx finished. HH PT and OT referrals placed as well, with plan for follow up ambulatory referrals to PT, OT, Pain Clinic. Outpatient plan to re-evaluate thoracic osteomyelitis with repeat imaging and consideration of surgical intervention following completion of antibiotics. - pain control: - Scheduled tylenol 975mg  TID - Morphine 15mg  q4h PRN - 15 pills given to allow for opioid weaning and prevent withdrawal symptoms and rehospitalization - Gabapentin 900mg  TID - Ibuprofen PRN - Voltaren Gel - Lidocaine patches - Robaxin - TSLO Brace  # Nausea/vomiting # Diarrhea Most likely in the setting of acute pain, antibiotics, and possibly tapering off opioids. Improved with PRN Zofran and Imodium. Stable and improved by the time of discharge.  # Intermittent chest discomfort Noted last admission. Within dermatomal distribution of osteomyelitis. Favored to be secondary to OM. Normal EKGs.  # Severe OUD in remission Patient currently goes to [facility] in [place], pharmacy confirmed dose of methadone 120 mg daily. Continue methadone 120mg  concentrated liquid. Called methadone clinic to encourage them to provide patient with 1 week supply at a time, as she cited daily trips to the methadone clinic as an inciting event for not succeeding with prior outpatient plans. Provided a letter on discharge explaining that the patient had been admitted to the hospital and received oxycodone and Dilaudid for management of acute pain and is expected to have positive UDS for opiates.  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 However, looks like she has severe iron deficiency anemia. Did not receive IV iron repletion inpatient given actively treating for bacteremia. Would recommend future IV iron repletion in the outpatient setting after completing antibiotic therapy.  Hemoglobin improved to 9.6 on admission.  #Oral Pain c/f dental caries Known dental care needs. Is planning on following up with dentistry as outpatient. Unfortunately can not do x-ray orthopanogram inpatient, patient is not comfortable laying flat in CT scanner. Needs f/u as outpatient.  # Anxiety # Insomnia #PTSD -Continue home Seroquel, melatonin  # Vaping Nicotine patch removed from med list as patient states she is not using. Continued to use lozenges prn, continued at discharge. PCP to continue efforts to encourage cessation.  # History of GERD Continue home Protonix .  # Concern for Asthma Patient started on inhaler outpatient due to SOB and chest tightness. Concern for asthma but no formal testing. Continue home albuterol PRN.  # IgA deficiency Requires washed blood given increased risk of anaphylaxis if needing transfusion.  Malnutrition: She has been diagnosed with moderate protein-calorie malnutrition in the context of acute illness, based on energy intake meeting < 75% of estimated requirement for > 7 days, mild fluid accumulation. - Recommend oral nutrition.  Surgeries and Procedures Performed: None  _____________________  Discharge Exam: BP 136/78 (BP Location: Right upper arm, Patient Position: Lying)  Pulse 67  Temp 36.7 C (98.1 F) (Oral)  Resp 16  Wt 91.7 kg (202  lb 2.6 oz)  LMP 01/12/2023 (Approximate)  SpO2 95%  BMI 35.82 kg/m O2 Device: Nasal cannula (01/21/23 1352)  General: alert, active, no acute distress, standing up with walker, comfortable HEENT: conjunctiva clear, EOMI, MMM, poor dentition Heart: regular rate and rhythm, no murmurs, rubs, or gallops Lungs: clear to ausculation bilaterally, no crackles, rales, or wheezes Abdomen: soft, mild tenderness in epigastric area, non-distended, Extremities: LLE wrapped in dressing, all extremities WWP Skin: Improving erythematous papular rash across chest and face Psych: Mood and affect are  normal  Pertinent Lab Testing: Recent Labs Lab 01/26/23 0432 01/29/23 0522 01/30/23 1056 NA 140 140 137 K 3.5 3.8 3.7 CL 105 106 103 CO2 26 27 27  BUN 10 13 14  CREATININE 0.9 0.7 0.9 GLUCOSE 105 99 83 CALCIUM 8.3* 8.6* 8.6*  No results for input(s): "AST", "ALT", "ALKPHOS", "TBILI" in the last 168 hours. No results for input(s): "WBC", "HGB", "HCT", "PLT" in the last 168 hours. No results for input(s): "APTT", "INR" in the last 168 hours.  Other Pertinent Labs:  Micro: Lab Results Component Value Date BLDCULT No growth detected. 01/18/2023 BLDCULT No growth detected. 01/17/2023   Pertinent Imaging:  MRI cervical spine with and without contrast  Result Date: 01/21/2023 MRI CERVICAL SPINE WITHOUT AND WITH CONTRAST MRI THORACIC SPINE WITHOUT AND WITH CONTRAST INDICATION: Neck pain, acute, infection suspected, M46.20 Osteomyelitis of vertebra, site unspecified (CMS/HHS-HCC) COMPARISON: MRI thoracic spine dated 12/25/2022 TECHNIQUE/PROTOCOL: Intradural (spinal cord) protocol MRI of the cervical and thoracic spine performed pre and post contrast administration. CERVICAL SPINE FINDINGS: Alignment: Normal cervical spine alignment and craniocervical junction. Spinal cord: The visualized spinal cord is normal in caliber and signal. No abnormal enhancement. Bone marrow signal: No suspicious lesions. Regional Soft Tissues: Unremarkable. C1-C2: Unremarkable. C2-C3: Unremarkable. C3-C4: Unremarkable. C4-C5: Unremarkable. C5-C6: Left eccentric posterior disc protrusion with superimposed posterior disc bulging, though no spinal canal stenosis. C6-C7: Unremarkable. C7-T1: Unremarkable. THORACIC SPINE FINDINGS: Anatomical variants: Conventional spinal numbering Alignment: Normal. Spinal Cord: Spinal cord is normal in caliber and signal. No abnormal enhancement. Conus medullaris: terminates at approximately L1. Bone Marrow Signal: Redemonstrated destructive changes of the T5-T6 vertebral body endplates  with associated abnormal marrow edema signal and enhancement within these vertebral bodies as well as the intervertebral disc space. There is approximately 35% anterior height loss of the T5 vertebral body and 20% anterior height loss of the T6 vertebral body, similar to prior. Similar enhancing ventral epidural fluid collection measuring up to 5 mm in maximum AP dimension thickness (series 15 image 10). This results in effacement of the ventral thecal sac with mild spinal canal stenosis. No associated abnormal signal in the adjacent spinal cord. Degenerative changes: No high-grade spinal canal or neuroforaminal stenosis. Regional Soft Tissues: Trace bilateral pleural effusions with associated dependent bibasilar opacities. Incidentally noted enlarged right upper paratracheal lymph node, possibly reactive. IMPRESSION: Similar MRI findings of T5-T6 discitis osteomyelitis with epidural phlegmonous extension resulting in mild spinal canal stenosis. No new sites of discitis osteomyelitis in the cervicothoracic spine or discrete epidural abscess. Electronically Reviewed by: Warrick Parisian, MD, [facility] Electronically Reviewed on: 01/21/2023 4:05 PM I have reviewed the images and concur with the above findings. Electronically Signed by: Consuelo Pandy, MD, [facility] Electronically Signed on: 01/21/2023 4:46 PM  MRI thoracic spine with and without contrast  Result Date: 01/21/2023 MRI CERVICAL SPINE WITHOUT AND WITH CONTRAST MRI THORACIC SPINE WITHOUT AND WITH CONTRAST INDICATION: Neck pain, acute, infection suspected, M46.20 Osteomyelitis of vertebra, site unspecified (CMS/HHS-HCC) COMPARISON: MRI thoracic spine  dated 12/25/2022 TECHNIQUE/PROTOCOL: Intradural (spinal cord) protocol MRI of the cervical and thoracic spine performed pre and post contrast administration. CERVICAL SPINE FINDINGS: Alignment: Normal cervical spine alignment and craniocervical junction. Spinal cord: The visualized spinal cord  is normal in caliber and signal. No abnormal enhancement. Bone marrow signal: No suspicious lesions. Regional Soft Tissues: Unremarkable. C1-C2: Unremarkable. C2-C3: Unremarkable. C3-C4: Unremarkable. C4-C5: Unremarkable. C5-C6: Left eccentric posterior disc protrusion with superimposed posterior disc bulging, though no spinal canal stenosis. C6-C7: Unremarkable. C7-T1: Unremarkable. THORACIC SPINE FINDINGS: Anatomical variants: Conventional spinal numbering Alignment: Normal. Spinal Cord: Spinal cord is normal in caliber and signal. No abnormal enhancement. Conus medullaris: terminates at approximately L1. Bone Marrow Signal: Redemonstrated destructive changes of the T5-T6 vertebral body endplates with associated abnormal marrow edema signal and enhancement within these vertebral bodies as well as the intervertebral disc space. There is approximately 35% anterior height loss of the T5 vertebral body and 20% anterior height loss of the T6 vertebral body, similar to prior. Similar enhancing ventral epidural fluid collection measuring up to 5 mm in maximum AP dimension thickness (series 15 image 10). This results in effacement of the ventral thecal sac with mild spinal canal stenosis. No associated abnormal signal in the adjacent spinal cord. Degenerative changes: No high-grade spinal canal or neuroforaminal stenosis. Regional Soft Tissues: Trace bilateral pleural effusions with associated dependent bibasilar opacities. Incidentally noted enlarged right upper paratracheal lymph node, possibly reactive. IMPRESSION: Similar MRI findings of T5-T6 discitis osteomyelitis with epidural phlegmonous extension resulting in mild spinal canal stenosis. No new sites of discitis osteomyelitis in the cervicothoracic spine or discrete epidural abscess. Electronically Reviewed by: Warrick Parisian, MD, [facility] Electronically Reviewed on: 01/21/2023 4:05 PM I have reviewed the images and concur with the above findings.  Electronically Signed by: Consuelo Pandy, MD, [facility] Electronically Signed on: 01/21/2023 4:46 PM  XR EOS Scan Multi View  Result Date: 01/14/2023 RADIOGRAPHS OF THE ENTIRE SPINE - 2 VIEWS AND FLEXION-EXTENSION VIEWS OF THE CERVICAL SPINE CLINICAL INFORMATION: pre-op, M46.23 Osteomyelitis of vertebra, cervicothoracic region (CMS/HHS-HCC). COMPARISON: Thoracic and lumbar spine MRI 12/25/2022. FINDINGS: ENTIRE SPINE: The lower cervical and upper thoracic spine are underpenetrated on lateral view and suboptimally evaluated. Mild broad levocurvature of the thoracolumbar spine. Neutral coronal and sagittal balance. Exaggerated thoracic kyphosis. Straightening of lumbar lordosis. Right superior pelvic tilt. Bony destructive changes at T5-T6 are poorly visualized due to technique and better assessed on the recent prior MRI thoracic spine 12/25/2022. Vertebral body heights of the upper thoracic and mid thoracic spine are poorly visualized. Lumbar vertebral body heights are preserved. Intervertebral disc spaces throughout the lumbar spine are preserved. Dedicated flexion and extension views of the cervical spine demonstrate no abnormal motion. Posterior endplate osteophytes at C4-C5 and C5-C6. BONE LENGTH: Bilateral genu varum, left greater than right. IMPRESSION: 1. Mild broad levocurvature of the thoracolumbar spine with exaggerated thoracic kyphosis and straightening of lumbar lordosis. Neutral coronal and sagittal balance. Right pelvic tilt. 2. Bony destructive changes at T5-T6 are poorly evaluated due to technique and better assessed on the recent prior MRI on 12/25/2022. Dedicated radiographs of the thoracic spine are recommended for further evaluation. 3. Dedicated flexion and extension views of the cervical spine demonstrate no abnormal translation. Please note that this examination is designed to assess spinal alignment; if detailed evaluation of the cervical, thoracic, and/or lumbar spine is desired,  consider dedicated cervical spine, thoracic spine, and/or lumbar spine radiographic studies. Electronically Signed by: Verlee Rossetti, MD, [facility] Electronically Signed on: 01/14/2023 11:44  AM  CT bone biopsy deep  Result Date: 01/01/2023 CT-GUIDED SPINE BIOPSY AND ASPIRATION 12/31/2022 Indication/ pre-procedure diagnosis: Discitis/osteomyelitis Post-procedure diagnosis: Same; pathology results pending. Comparison(s): MRI thoracic spine 12/25/2022 Radiologists: Consuelo Pandy M.D., Jake Bathe M.D. Sedation/Anesthesia: General anesthesia was administered by the anesthesia department. Please see anesthesia notes for additional details. Estimated blood loss: Minimal Technique and Findings: The risks, benefits, and alternatives to the procedure were explained to the patient, who gave verbal and written informed consent to proceed with the procedure. A "timeout" was performed prior to the start of the procedure to confirm correct patient, procedure, and site. The patient was placed prone on the CT table, and scout imaging of the region of discitis osteomyelitis at T5-6 was identified. The skin overlying the posterior mid back was marked, then prepped and draped in sterile fashion. A right-sided approach was planned at the T5-T6 level. One dermatotomy was made overlying the area of interest. Under CT fluoroscopic guidance, a 10 cm 11-gauge Arrow OnControl introducer needle was advanced into the T5-T6 disc space via a right costovertebral approach. Next, two 13-gauge core specimens were obtained. Aspiration was then performed yielding 4 cc of serosanguineous fluid from the disc space. All needles were removed, and hemostasis achieved. The patient tolerated the procedure well, with no immediate complications. Dr. Martina Sinner was present for the entire procedure. Impression: Technically successful CT-guided vertebral body biopsy and disc aspiration at T5-T6. Findings discussed with Lavenia Atlas MD by Jake Bathe,  MD at 12/31/2022 18:30 Electronically Reviewed by: Jake Bathe, MD, [facility] Electronically Reviewed on: 01/01/2023 8:16 AM I have reviewed the images and concur with the above findings. Electronically Signed by: Consuelo Pandy, MD, [facility] Electronically Signed on: 01/01/2023 4:43 PM  X-ray chest single view portable  Result Date: 01/01/2023 PROCEDURE: XR CHEST SINGLE VIEW PORTABLE INDICATION: Lung Aeration, R07.9 Chest pain, unspecified COMPARISONS: 12/24/2022 FINDINGS: Unchanged] cardiomediastinal contour. Low lung volumes with bilateral subsegmental atelectasis and redemonstrated minimal left basilar scarring. No pleural effusion. No pneumothorax. IMPRESSION: No acute cardiopulmonary process. Electronically Reviewed by: Archie Balboa, MD, [facility] Electronically Reviewed on: 01/01/2023 3:35 PM I have reviewed the images and concur with the above findings. Electronically Signed by: Lennice Sites, MD, [facility] Electronically Signed on: 01/01/2023 3:44 PM  Echo TEE  Result Date: 01/01/2023 [facility] [id number] DOB: Apr 02, 1985 Age: 73 TEE REPORT Date: 01/01/2023 Female Inpatient LOCATION: [place] Lab MD1: ALEXANDRA LEE STUDY: ECHO TEE SOUND QLTY: Moderate ECHO: Yes STRAIN: No COLOR: Yes 3D: No DOPPLER: No BP: 144 / 85 RV BIOPSY: No HR: 62 BPM CONTRAST: No Height: 63 in MEDIUM: N/A Weight: 209 lbs MACHINE: CDU - EPIQ-4 BSA: 2.0 ------------------------------------------------------------------------------------------ History: Endocarditis Reason: Assess Endocarditis Indication: Z86.79- Personal history of other diseases of the circulat. CONCLUSION ------------------------------------------------------------------------------- 1. No transesophageal echocardiographic evidence of endocarditis 2. Normal biventricular systolic function 3. Mild mitral and trivial tricuspid regurgitation 4. Mild aortic atherosclerosis 5. No LAA thrombus  ANESTHESIA ------------------------------------------------------------------------------- Midazolam: No Fentanyl: No Propofol: Yes Dose: 220 mg (Administered by Anesthesiology) Ketamine: No Dexmedetomidine: No Intubation: Esophageal intubation without difficulty. Complications: None CONSENT ---------------------------------------------------------------------------------- The attending Cristine Polio, M.D. was present and participated throughout the entire procedure. Informed consent was obtained from the patient and a timeout was performed prior to the procedure. PROCEDURE -------------------------------------------------------------------------------- MITRAL VALVE Leaflets: Normal Mobility: Fully Mobile Mass: No Masses Doppler-MR: MILD MR MS: MS Not Assessed AORTIC VALVE AoCusps: Tricuspid Mobility: Fully Mobile Morphology: Normal Mass: No Masses Doppler- AR: No AR AS:  AS Not Assessed AORTA Asc Ao Size: Normal Atherosclerosis: None Dissection: No Dissection Ao Arch Size: Normal Atherosclerosis: MILD (0-73mm) Dissection: No Dissection Desc Ao Size: Normal Atherosclerosis: MILD (0-76mm) Dissection: No Dissection TRICUSPID VALVE Leaflets: Normal Mobility: Fully Mobile Morphology: Normal Mass: No Masses Doppler-TR: TRIVIAL TR TS: TS Not Assessed PULMONIC VALVE Leaflets: Tricuspid Mobility: Fully Mobile Morphology: Normal Mass: No Masses Doppler-PR: TRIVIAL PR PS: PS Not Assessed LEFT ATRIUM / APPENDAGE Size: Normal Mass: No Masses LA Note: No left atrial appendage thrombus RIGHT ATRIUM Size: Normal Mass: No Masses INTER-ATRIAL SEPTUM Septal Defect: None Aneurysm: Absent LEFT VENTRICLE Size: Normal LVH: None Contraction: Normal Closest EF: >55% LV Mass: No Masses Dias. FxClass: Not Assessed RIGHT VENTRICLE Size: Normal Free Wall: Normal Contraction: Normal RV masses: No Masses PERICARDIUM Fluid: No Effusion (Report version 1.0) Perform By: Thana Ates, M.D. Entered By: Thana Ates, M.D. Res. Person: Rick Duff, ACS, RCS Inter.Fellow: Thana Ates, M.D. Resp.Fellow: Thana Ates, M.D. Resp. Staff: Rickey Primus, RN Electronically signed by Luciana Axe, M.D. on:01/01/2023 2:32:37 PM with status of Final The images are stored in the [facility] system, please contact the clinical provider for images related to this study. _____________________  Code Status: Full Code Goals of care were not addressed during this admission.  Status on Discharge: Current activity: Walks occasionally (01/30/23 0800) Current mobility: Slightly limited (01/30/23 0800)  Activity Recommendation: activity as tolerated  Other Discharge Instructions: Services setup at discharge: Home Health PT/OT, Home Health Nurse, and Home infusion for daptomycin Tubes/lines at discharge: PICC line Diet: Diet regular Oral Supplements - Adult All Supplements; Ensure Plus High Protein-Assorted; Breakfast and Dinner  Wound Care Order Instructions   Wound Care As needed Comments: Please provide patient supplies and allow them to change dressing Question Answer Comment Body Site Left Leg Instructions Clean with vashe moistened gauze and allow to dry. Apply Vaseline gauze to the wound and cover with ABD pad or mepilex foam. Change daily.  _____________________  Time spent on discharge process: 45 minutes  Leandra Kern, MD [facility] 01/30/2023  Hospital Contact Information: [facility]) [facility]) [facility]) Pending tests: Laboratory: [phone] Microbiology: [phone] Pathology: [phone] Radiology: [phone]  General questions: [phone] Pending tests: Laboratory: [phone] Microbiology: [phone] Pathology: [phone] Radiology: [phone]  General questions: [phone] Pending tests: Laboratory: [phone] Microbiology: [phone] Pathology: [phone] Radiology: [phone]  General  questions: [phone]  I confirm that I was present for the key and critical portions of the service, including a review of the patient's history and other pertinent data. I personally examined the patient, and formulated the evaluation and/or treatment plan. I have reviewed the note of Dr. Beryle Flock and agree with the findings documented in the note, with any exceptions as noted below.  Justice Deeds, MD *[phone] Associate Professor of Medicine [facility]  Electronically signed by Leandra Kern, MD at 01/30/2023 3:59 PM EST Electronically signed by Lyndee Leo, MD at 01/30/2023 4:26 PM EST  Discharge Instructions - documented in this encounter  Table of Contents for Discharge Instructions  Discharge Instructions  Appointments    Discharge Instructions Bair, Gloris Ham, MD - 01/30/2023 1:09 PM EST  Formatting of this note might be different from the original. You were treated for the following issues during this hospital admission: Vertebral Osteomyelitis. You were admitted to the hospital to further treat the infection in your spine and treat your associated  pain. We switched your antibiotics to Daptomycin and Ciprofloxacin based on reactions you had to the Vancomycin and Ceftriaxone. You will continue these until 02/04/2023. We helped treat your pain while you were in patient. We got you a new back brace and a rolling walker for home. Our PT and OT teams worked with you as well.    11/22/2022    2:16 PM 11/05/2022    9:03 AM 10/14/2022    2:45 PM  Depression screen PHQ 2/9  Decreased Interest 3 3 1   Down, Depressed, Hopeless 3 3 1   PHQ - 2 Score 6 6 2   Altered sleeping 1 3 2   Tired, decreased energy 3 3 1   Change in appetite 3 0 1  Feeling bad or failure about yourself  2 0 1  Trouble concentrating 3 3 1   Moving slowly or fidgety/restless 0 0 2  Suicidal thoughts 0 0 0  PHQ-9 Score 18 15 10   Difficult doing work/chores Somewhat  difficult Extremely dIfficult Somewhat difficult    Relevant past medical, surgical, family and social history reviewed and updated as indicated. Interim medical history since our last visit reviewed. Allergies and medications reviewed and updated.  Review of Systems  Per HPI unless specifically indicated above     Objective:    There were no vitals taken for this visit.  {Vitals History (Optional):23777} Wt Readings from Last 3 Encounters:  11/22/22 205 lb 3.2 oz (93.1 kg)  11/05/22 199 lb 3.2 oz (90.4 kg)  10/14/22 196 lb 12.8 oz (89.3 kg)    Physical Exam  Results for orders placed or performed in visit on 11/05/22  POCT urinalysis dipstick   Collection Time: 11/05/22 10:03 AM  Result Value Ref Range   Color, UA Yellow    Clarity, UA Cloudy    Glucose, UA Negative Negative   Bilirubin, UA Negative    Ketones, UA Negative    Spec Grav, UA 1.020 1.010 - 1.025   Blood, UA Large    pH, UA 6.0 5.0 - 8.0   Protein, UA Negative Negative   Urobilinogen, UA 0.2 0.2 or 1.0 E.U./dL   Nitrite, UA Negative    Leukocytes, UA Moderate (2+) (A) Negative   Appearance Yellow    Odor Foul   Urine Culture   Collection Time: 11/05/22 11:28 AM   Specimen: Urine  Result Value Ref Range   MICRO NUMBER: [id number]    SPECIMEN QUALITY: Adequate    Sample Source URINE    STATUS: FINAL    Result: No Growth    {Labs (Optional):23779}    Assessment & Plan:   Problem List Items Addressed This Visit   None    Assessment and Plan             Follow up plan: No follow-ups on file.

## 2023-02-14 ENCOUNTER — Ambulatory Visit: Payer: MEDICAID | Admitting: Nurse Practitioner

## 2023-02-17 ENCOUNTER — Other Ambulatory Visit: Payer: Self-pay

## 2023-02-19 ENCOUNTER — Ambulatory Visit: Payer: MEDICAID | Admitting: Physician Assistant

## 2023-02-19 NOTE — Progress Notes (Deleted)
Celso Amy, PA-C [address]  [address]  [place], [place] [address]  Main: [phone]  Fax: [phone]   Gastroenterology Consultation  Referring Provider:     Berniece Salines, FNP Primary Care Physician:  Berniece Salines, FNP Primary Gastroenterologist:  *** Reason for Consultation:     Abdominal pain        HPI:   Karen Clarke is a 37 y.o. y/o female referred for consultation & management  by Berniece Salines, FNP.    Patient was admitted to [facility] 01/17/2023 until 01/30/2023 for vertebral osteomyelitis T5-T6, severe back pain, polysubstance abuse, chest discomfort, major depressive disorder, IV drug use.  Currently on methadone.  Has acute on chronic anemia.  Current symptoms  No recent abdominal imaging  01/23/2023 labs: Hemoglobin 8.8, hematocrit 29, MCV 78.  Normal WBC 5.5. 01/17/2023: Normal CMP and lipase.  Normal LFTs. Past Medical History:  Diagnosis Date   Acute upper back pain 12/24/2022   Anemia    Anxiety    Borderline personality disorder (HCC)    Depression    Endocarditis    IgA deficiency (HCC)    Lupus    Lupus    Moderate protein-calorie malnutrition (HCC) 01/28/2023   Pseudotumor cerebri    PTSD (post-traumatic stress disorder)    Vertebral osteomyelitis (HCC) 01/16/2023   Vitamin D deficiency 01/29/2023    Past Surgical History:  Procedure Laterality Date   CESAREAN SECTION     COLON SURGERY     FINGER AMPUTATION     TEE WITHOUT CARDIOVERSION N/A 02/09/2021   Procedure: TRANSESOPHAGEAL ECHOCARDIOGRAM (TEE);  Surgeon: Antonieta Iba, MD;  Location: [facility] ORS;  Service: Cardiovascular;  Laterality: N/A;   TONSILLECTOMY      Prior to Admission medications   Medication Sig Start Date End Date Taking? Authorizing Provider  acetaminophen (TYLENOL) 325 MG tablet Take 2 tablets (650 mg total) by mouth every 6 (six) hours as needed for mild pain (or Fever >/= 101). 04/06/21   Lurene Shadow, MD  albuterol (VENTOLIN HFA) 108 (90  Base) MCG/ACT inhaler INHALE 2 PUFFS INTO THE LUNGS EVERY 6 HOURS AS NEEDED FOR WHEEZING OR SHORTNESS OF BREATH 11/27/22   Berniece Salines, FNP  gabapentin (NEURONTIN) 100 MG capsule Take 1 capsule (100 mg total) by mouth 3 (three) times daily. 10/14/22   Berniece Salines, FNP  ibuprofen (ADVIL) 400 MG tablet Take 2 tablets (800 mg total) by mouth every 8 (eight) hours as needed for moderate pain. 04/06/21   Lurene Shadow, MD  melatonin 3 MG TABS tablet Take 3 mg by mouth at bedtime. 04/10/22   [provider]  methadone (DOLOPHINE) 10 MG/ML solution Take 110 mg by mouth daily.    [provider]  ondansetron (ZOFRAN-ODT) 4 MG disintegrating tablet Take 1-2 tablets (4-8 mg total) by mouth every 8 (eight) hours as needed for nausea or vomiting. 11/05/22   Danelle Berry, PA-C  pantoprazole (PROTONIX) 40 MG tablet Take 1 tablet (40 mg total) by mouth 2 (two) times daily. 11/05/22   Danelle Berry, PA-C  phenazopyridine (PYRIDIUM) 200 MG tablet Take by mouth. 11/01/22   [provider]  QUEtiapine (SEROQUEL) 100 MG tablet TAKE 1 TABLET(100 MG) BY MOUTH AT BEDTIME 01/17/23   Berniece Salines, FNP  sucralfate (CARAFATE) 1 g tablet TAKE 1 TABLET( 1 GRAM TOTAL) BY MOUTH THREE TIMES DAILY WITH MEALS AS NEEDED FOR GERD/ REFLUX/ EPIGASTRIC PAIN WHEN EATING OR DRINKING 11/25/22   Della Goo  F, FNP    Family History  Problem Relation Age of Onset   COPD Mother    Heart disease Mother    Mental illness Mother    Multiple sclerosis Mother    Hearing loss Father    Hypertension Father      Social History   Tobacco Use   Smoking status: Every Day    Types: Cigarettes    Passive exposure: Current   Smokeless tobacco: Never  Vaping Use   Vaping status: Former   Substances: Nicotine, Flavoring  Substance Use Topics   Alcohol use: Never   Drug use: Yes    Types: IV, Cocaine, Opium    Comment: clean for 3 years    Allergies as of 02/19/2023 - Review Complete 01/14/2023  Allergen  Reaction Noted   Cefazolin Rash 11/01/2018    Review of Systems:    All systems reviewed and negative except where noted in HPI.   Physical Exam:  There were no vitals taken for this visit. No LMP recorded. (Menstrual status: IUD).  General:   Alert,  Well-developed, well-nourished, pleasant and cooperative in NAD Lungs:  Respirations even and unlabored.  Clear throughout to auscultation.   No wheezes, crackles, or rhonchi. No acute distress. Heart:  Regular rate and rhythm; no murmurs, clicks, rubs, or gallops. Abdomen:  Normal bowel sounds.  No bruits.  Soft, and non-distended without masses, hepatosplenomegaly or hernias noted.  No Tenderness.  No guarding or rebound tenderness.    Neurologic:  Alert and oriented x3;  grossly normal neurologically. Psych:  Alert and cooperative. Normal mood and affect.  Imaging Studies: No results found.  Assessment and Plan:   Karen Clarke is a 37 y.o. y/o female has been referred for   1.  Abdominal pain  Lab: CMP  CT abdomen pelvis with contrast  Treat constipation  2.  Chronic anemia; microcytic  Labs: CBC, iron panel, ferritin, B12, folate, and celiac lab.   3.  History of IV drug use and polysubstance abuse currently on methadone  4.  Vertebral osteomyelitis requiring hospitalization and IV antibiotics November 2024.  Follow up ***  Celso Amy, PA-C    BP check ***

## 2023-05-12 ENCOUNTER — Other Ambulatory Visit: Payer: Self-pay | Admitting: Nurse Practitioner

## 2023-05-12 ENCOUNTER — Other Ambulatory Visit: Payer: Self-pay

## 2023-05-12 ENCOUNTER — Ambulatory Visit: Payer: MEDICAID | Admitting: Nurse Practitioner

## 2023-05-12 DIAGNOSIS — F603 Borderline personality disorder: Secondary | ICD-10-CM

## 2023-05-12 DIAGNOSIS — F4310 Post-traumatic stress disorder, unspecified: Secondary | ICD-10-CM

## 2023-05-12 MED ORDER — QUETIAPINE FUMARATE 100 MG PO TABS: 100.0000 mg | ORAL_TABLET | Freq: Every day | ORAL | 0 refills | Status: AC

## 2023-05-12 NOTE — Telephone Encounter (Unsigned)
 Copied from CRM [id number]. Topic: Clinical - Medication Refill >> May 12, 2023 10:10 AM Turkey B wrote: Most Recent Primary Care Visit:  Provider: Berniece Salines  Department: [facility]  Visit Type: OFFICE VISIT  Date: 11/22/2022  Medication: QUEtiapine (SEROQUEL) 100 MG tablet   Has the patient contacted their pharmacy? yes  (Agent: If yes, when and what did the pharmacy advise?)contact pcp  Is this the correct pharmacy for this prescription? yes   This is the patient's preferred pharmacy:  [facility] [address] in [place], [place]. [address]  Has the prescription been filled recently? no  Is the patient out of the medication? yes  Has the patient been seen for an appointment in the last year OR does the patient have an upcoming appointment? yes  Can we respond through MyChart? yes  Agent: Please be advised that Rx refills may take up to 3 business days. We ask that you follow-up with your pharmacy.

## 2023-05-12 NOTE — Progress Notes (Deleted)
 Name: Karen Clarke   MRN: [id number]    DOB: [birth year]   Date:05/12/2023       Progress Note  Subjective  Chief Complaint  No chief complaint on file.   I connected with  Karen Clarke  on 05/12/23 at  2:40 PM EDT by a video enabled telemedicine application and verified that I am speaking with the correct person using two identifiers.  I discussed the limitations of evaluation and management by telemedicine and the availability of in person appointments. The patient expressed understanding and agreed to proceed with the virtual visit  Staff also discussed with the patient that there may be a patient responsible charge related to this service. 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   Leg edema, left 07/31/2021   Sepsis due to cellulitis (HCC) 03/01/2021   Bacteremia 02/08/2021   Delayed surgical wound healing 08/17/2020   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   History of microcytic hypochromic anemia 05/22/2020   Orbital cellulitis, left 04/08/2020   Infection 02/23/2020   Arterial insufficiency with ischemic ulcer (HCC) 02/22/2020   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/22/2020   Flexor tenosynovitis of finger 02/19/2020   Skin ulcer of left pretibial region with fat layer exposed (HCC) 02/19/2020   Abscess 12/25/2019   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   Hepatitis A immune 10/04/2018   Pseudotumor cerebri 06/21/2018   Tobacco abuse 06/21/2018   Hypokalemia 06/21/2018   MRSA bacteremia 06/16/2018   Severe opioid use disorder (HCC) 01/01/2018   Major  depressive disorder, recurrent episode, moderate (HCC) 11/12/2016   Anxiety during pregnancy, antepartum 04/03/2016   Positive hepatitis C antibody test 03/27/2016   Cannabis use without complication 03/27/2016   Family history of birth defects 03/27/2016   History of C-section 03/27/2016   History of gestational hypertension 03/27/2016   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   High-risk pregnancy, third trimester 03/20/2016    Past Surgical History:  Procedure Laterality Date   CESAREAN SECTION     COLON SURGERY     FINGER AMPUTATION     TEE WITHOUT CARDIOVERSION N/A 02/09/2021   Procedure: TRANSESOPHAGEAL ECHOCARDIOGRAM (TEE);  Surgeon: Antonieta Iba, MD;  Location: [facility] ORS;  Service: Cardiovascular;  Laterality: N/A;   TONSILLECTOMY      Family History  Problem Relation Age of Onset   COPD Mother    Heart disease Mother    Mental illness Mother    Multiple sclerosis Mother    Hearing loss Father    Hypertension Father     Social History   Socioeconomic History   Marital status: Single    Spouse name: Not on file   Number of children: 2   Years of education: Not on file   Highest education level: Not on file  Occupational History   Not on file  Tobacco Use   Smoking status: Every Day    Types: Cigarettes    Passive exposure: Current   Smokeless tobacco: Never  Vaping Use   Vaping status: Former   Substances: Nicotine, Flavoring  Substance and Sexual Activity   Alcohol use: Never  Drug use: Yes    Types: IV, Cocaine, Opium    Comment: clean for 3 years   Sexual activity: Yes  Other Topics Concern   Not on file  Social History Narrative   Not on file   Social Drivers of Health   Financial Resource Strain: High Risk (01/31/2023)   Received from [facility]   Overall Financial Resource Strain (CARDIA)    Difficulty of Paying Living Expenses: Very hard  Food Insecurity: Food Insecurity Present (01/31/2023)   Received from [facility]   Hunger Vital Sign    Ran Out of Food in the Last Year: Often true    Worried About Running Out of Food in the Last Year: Often true  Transportation Needs: Unmet Transportation Needs (01/31/2023)   Received from [facility]   PRAPARE - Transportation    Lack of Transportation (Non-Medical): Yes    In the past 12 months, has lack of transportation kept you from medical appointments or from getting medications?: Yes  Physical Activity: Not on file  Stress: Not on file  Social Connections: Unknown (06/15/2018)   [facility]   Social Connection and Isolation Panel [NHANES]    Frequency of Communication with Friends and Family: Not on file    Frequency of Social Gatherings with Friends and Family: Not on file    Attends Religious Services: Not on file    Active Member of Clubs or Organizations: Not on file    Attends Banker Meetings: Not on file    Marital Status: Separated  Intimate Partner Violence: Not At Risk (01/14/2023)   Humiliation, Afraid, Rape, and Kick questionnaire    Fear of Current or Ex-Partner: No    Emotionally Abused: No    Physically Abused: No    Sexually Abused: No     Current Outpatient Medications:    acetaminophen (TYLENOL) 325 MG tablet, Take 2 tablets (650 mg total) by mouth every 6 (six) hours as needed for mild pain (or Fever >/= 101)., Disp: , Rfl:    albuterol (VENTOLIN HFA) 108 (90 Base) MCG/ACT inhaler, INHALE 2 PUFFS INTO THE LUNGS EVERY 6 HOURS AS NEEDED FOR WHEEZING OR SHORTNESS OF BREATH, Disp: 18 g, Rfl: 1   gabapentin (NEURONTIN) 100 MG capsule, Take 1 capsule (100 mg total) by mouth 3 (three) times daily., Disp: 90 capsule, Rfl: 3   ibuprofen (ADVIL) 400 MG tablet, Take 2 tablets (800 mg total) by mouth every 8 (eight) hours as needed for moderate pain., Disp: , Rfl:    melatonin 3 MG TABS tablet, Take 3 mg by mouth at bedtime.,  Disp: , Rfl:    methadone (DOLOPHINE) 10 MG/ML solution, Take 110 mg by mouth daily., Disp: , Rfl:    ondansetron (ZOFRAN-ODT) 4 MG disintegrating tablet, Take 1-2 tablets (4-8 mg total) by mouth every 8 (eight) hours as needed for nausea or vomiting., Disp: 20 tablet, Rfl: 1   pantoprazole (PROTONIX) 40 MG tablet, Take 1 tablet (40 mg total) by mouth 2 (two) times daily., Disp: 60 tablet, Rfl: 1   phenazopyridine (PYRIDIUM) 200 MG tablet, Take by mouth., Disp: , Rfl:    QUEtiapine (SEROQUEL) 100 MG tablet, TAKE 1 TABLET(100 MG) BY MOUTH AT BEDTIME, Disp: 90 tablet, Rfl: 1   sucralfate (CARAFATE) 1 g tablet, TAKE 1 TABLET( 1 GRAM TOTAL) BY MOUTH THREE TIMES DAILY WITH MEALS AS NEEDED FOR GERD/ REFLUX/ EPIGASTRIC PAIN WHEN  EATING OR DRINKING, Disp: 30 tablet, Rfl: 1  Allergies  Allergen Reactions   Cefazolin Rash    Tolerated cefepime 02/20/20 Negative penicillin skin test 12/27/2020    I personally reviewed {Reviewed:14835} with the patient/caregiver today.   ROS ***  Objective  Virtual encounter, vitals not obtained.  There is no height or weight on file to calculate BMI.  Physical Exam ***  No results found for this or any previous visit (from the past 72 hours).  PHQ2/9:    11/22/2022    2:16 PM 11/05/2022    9:03 AM 10/14/2022    2:45 PM  Depression screen PHQ 2/9  Decreased Interest 3 3 1   Down, Depressed, Hopeless 3 3 1   PHQ - 2 Score 6 6 2   Altered sleeping 1 3 2   Tired, decreased energy 3 3 1   Change in appetite 3 0 1  Feeling bad or failure about yourself  2 0 1  Trouble concentrating 3 3 1   Moving slowly or fidgety/restless 0 0 2  Suicidal thoughts 0 0 0  PHQ-9 Score 18 15 10   Difficult doing work/chores Somewhat difficult Extremely dIfficult Somewhat difficult   PHQ-2/9 Result is {gen negative/positive:315881}.    Fall Risk:    11/22/2022    2:12 PM 11/05/2022    9:03 AM 10/14/2022    2:44 PM  Fall Risk   Falls in the past year? 0 0 0  Number falls in  past yr: 0 0 0  Injury with Fall? 0 0 0  Risk for fall due to : No Fall Risks No Fall Risks   Follow up Falls prevention discussed Falls prevention discussed;Education provided;Falls evaluation completed    ***  Assessment & Plan  There are no diagnoses linked to this encounter.  I discussed the assessment and treatment plan with the patient. The patient was provided an opportunity to ask questions and all were answered. The patient agreed with the plan and demonstrated an understanding of the instructions.  The patient was advised to call back or seek an in-person evaluation if the symptoms worsen or if the condition fails to improve as anticipated.  I provided *** minutes of non-face-to-face time during this encounter.

## 2023-05-19 ENCOUNTER — Other Ambulatory Visit: Payer: Self-pay | Admitting: Nurse Practitioner

## 2023-05-19 ENCOUNTER — Telehealth: Payer: Self-pay

## 2023-05-19 DIAGNOSIS — G629 Polyneuropathy, unspecified: Secondary | ICD-10-CM

## 2023-05-19 DIAGNOSIS — F4310 Post-traumatic stress disorder, unspecified: Secondary | ICD-10-CM

## 2023-05-19 DIAGNOSIS — F603 Borderline personality disorder: Secondary | ICD-10-CM

## 2023-05-19 NOTE — Telephone Encounter (Signed)
 Copied from CRM [id number]. Topic: Clinical - Medication Refill >> May 19, 2023 11:45 AM Shelah Lewandowsky wrote: Most Recent Primary Care Visit:  Provider: Berniece Salines  Department: [facility]  Visit Type: OFFICE VISIT  Date: 11/22/2022  Medication: QUEtiapine (SEROQUEL) 100 MG tablet- need 30 day supply gabapentin (NEURONTIN) 100 MG capsule  Has the patient contacted their pharmacy? No (Agent: If no, request that the patient contact the pharmacy for the refill. If patient does not wish to contact the pharmacy document the reason why and proceed with request.) (Agent: If yes, when and what did the pharmacy advise?)  Is this the correct pharmacy for this prescription? Yes If no, delete pharmacy and type the correct one.  This is the patient's preferred pharmacy:  [id number] - [place], [place] - [address] AT [facility] [address] [facility] [address] [place] [address] Phone: [phone] Fax: [phone]   Has the prescription been filled recently? Yes  Is the patient out of the medication? No  Has the patient been seen for an appointment in the last year OR does the patient have an upcoming appointment? Yes  Can we respond through MyChart? Yes  Agent: Please be advised that Rx refills may take up to 3 business days. We ask that you follow-up with your pharmacy.

## 2023-05-19 NOTE — Telephone Encounter (Signed)
 Pt called at 5:00 demanding refill on Quetiapine.   She was told to make an appointment and was given 7 day supply until that appointment.  Appointment was scheduled on 3/10 to which she cancelled.  There has been several no shows and canceled appointments.  She asked to speak with someone higher multiple times and I explained it was 5:00 and everyone was gone for the day.  I told her I could have office manager call her tomorrow.  She states she will be filing a grievance due to not having medicine refilled and states she did a zoom meeting through our office 2 weeks ago, but I do not see any documentation of this.  Last appointment was 11/22/22

## 2023-05-20 NOTE — Telephone Encounter (Signed)
 Requested medications are due for refill today.  yes  Requested medications are on the active medications list.  yes  Last refill. Gabapentin 10/14/2022 #90 3 rf, Seroquel 05/12/2023 #7 0 rf  Future visit scheduled.   no  Notes to clinic.  Expired labs. Pt need an appt. Please lease review for refill.    Requested Prescriptions  Pending Prescriptions Disp Refills   gabapentin (NEURONTIN) 100 MG capsule 90 capsule 3    Sig: Take 1 capsule (100 mg total) by mouth 3 (three) times daily.     Neurology: Anticonvulsants - gabapentin Failed - 05/20/2023  3:47 PM      Failed - Cr in normal range and within 360 days    Creatinine  Date Value Ref Range Status  04/03/2021 172.4 20.0 - 300.0 mg/dL Final   Creatinine, Ser  Date Value Ref Range Status  03/19/2022 0.69 0.44 - 1.00 mg/dL Final         Passed - Completed PHQ-2 or PHQ-9 in the last 360 days      Passed - Valid encounter within last 12 months    Recent Outpatient Visits           5 months ago Generalized abdominal pain   [facility] Berniece Salines, FNP   6 months ago Nausea and vomiting, unspecified vomiting type   [facility] Danelle Berry, PA-C   7 months ago PTSD (post-traumatic stress disorder)   [facility] Berniece Salines, FNP               QUEtiapine (SEROQUEL) 100 MG tablet 7 tablet 0    Sig: Take 1 tablet (100 mg total) by mouth at bedtime.     Not Delegated - Psychiatry:  Antipsychotics - Second Generation (Atypical) - quetiapine Failed - 05/20/2023  3:47 PM      Failed - This refill cannot be delegated      Failed - TSH in normal range and within 360 days    No results found for: "TSH", "POCTSH", "TSHREFLEX"       Failed - Lipid Panel in normal range within the last 12 months    No results found for: "CHOL", "POCCHOL", "CHOLTOT" No results found for: "LDLCALC", "LDLC", "HIRISKLDL", "POCLDL", "LDLDIRECT", "REALLDLC",  "TOTLDLC" No results found for: "HDL", "POCHDL" No results found for: "TRIG", "POCTRIG"       Failed - CBC within normal limits and completed in the last 12 months    WBC  Date Value Ref Range Status  03/19/2022 9.2 4.0 - 10.5 K/uL Final   RBC  Date Value Ref Range Status  03/19/2022 3.19 (L) 3.87 - 5.11 MIL/uL Final   RBC.  Date Value Ref Range Status  03/19/2022 3.37 (L) 3.87 - 5.11 MIL/uL Final   Hemoglobin  Date Value Ref Range Status  03/19/2022 7.4 (L) 12.0 - 15.0 g/dL Final    Comment:    Reticulocyte Hemoglobin testing may be clinically indicated, consider ordering this additional test [id number]    HCT  Date Value Ref Range Status  03/19/2022 24.0 (L) 36.0 - 46.0 % Final   MCHC  Date Value Ref Range Status  03/19/2022 30.8 30.0 - 36.0 g/dL Final   [facility]  Date Value Ref Range Status  03/19/2022 23.2 (L) 26.0 - 34.0 pg Final   MCV  Date Value Ref Range Status  03/19/2022 75.2 (L) 80.0 - 100.0 fL Final   No results found for: "PLTCOUNTKUC", "  LABPLAT", "POCPLA" RDW  Date Value Ref Range Status  03/19/2022 17.2 (H) 11.5 - 15.5 % Final         Failed - CMP within normal limits and completed in the last 12 months    Albumin  Date Value Ref Range Status  03/18/2022 3.1 (L) 3.5 - 5.0 g/dL Final   Alkaline Phosphatase  Date Value Ref Range Status  03/18/2022 179 (H) 38 - 126 U/L Final   ALT  Date Value Ref Range Status  03/18/2022 34 0 - 44 U/L Final   AST  Date Value Ref Range Status  03/18/2022 24 15 - 41 U/L Final   BUN  Date Value Ref Range Status  03/19/2022 10 6 - 20 mg/dL Final   Calcium  Date Value Ref Range Status  03/19/2022 8.0 (L) 8.9 - 10.3 mg/dL Final   Calcium, Ion  Date Value Ref Range Status  02/21/2009 0.79 (L) 1.12 - 1.32 mmol/L Final   CO2  Date Value Ref Range Status  03/19/2022 21 (L) 22 - 32 mmol/L Final   TCO2  Date Value Ref Range Status  02/21/2009 15 0 - 100 mmol/L Final   Creatinine  Date Value Ref Range  Status  04/03/2021 172.4 20.0 - 300.0 mg/dL Final   Creatinine, Ser  Date Value Ref Range Status  03/19/2022 0.69 0.44 - 1.00 mg/dL Final   Glucose, Bld  Date Value Ref Range Status  03/19/2022 109 (H) 70 - 99 mg/dL Final    Comment:    Glucose reference range applies only to samples taken after fasting for at least 8 hours.   Glucose-Capillary  Date Value Ref Range Status  03/19/2022 123 (H) 70 - 99 mg/dL Final    Comment:    Glucose reference range applies only to samples taken after fasting for at least 8 hours.   Potassium  Date Value Ref Range Status  03/19/2022 3.2 (L) 3.5 - 5.1 mmol/L Final   Sodium  Date Value Ref Range Status  03/19/2022 136 135 - 145 mmol/L Final   Total Bilirubin  Date Value Ref Range Status  03/18/2022 0.3 0.3 - 1.2 mg/dL Final   Protein, ur  Date Value Ref Range Status  03/17/2022 30 (A) NEGATIVE mg/dL Final   Protein, UA  Date Value Ref Range Status  11/05/2022 Negative Negative Final   Total Protein  Date Value Ref Range Status  03/18/2022 7.1 6.5 - 8.1 g/dL Final   GFR, Estimated  Date Value Ref Range Status  03/19/2022 >60 >60 mL/min Final    Comment:    (NOTE) Calculated using the CKD-EPI Creatinine Equation (2021)          Passed - Completed PHQ-2 or PHQ-9 in the last 360 days      Passed - Last BP in normal range    BP Readings from Last 1 Encounters:  11/22/22 120/78         Passed - Last Heart Rate in normal range    Pulse Readings from Last 1 Encounters:  11/22/22 95         Passed - Valid encounter within last 6 months    Recent Outpatient Visits           5 months ago Generalized abdominal pain   [facility] Berniece Salines, FNP   6 months ago Nausea and vomiting, unspecified vomiting type   [facility] Danelle Berry, PA-C   7 months ago PTSD (post-traumatic  stress disorder)   [facility] Berniece Salines, [place]

## 2023-06-17 ENCOUNTER — Other Ambulatory Visit: Payer: Self-pay | Admitting: Nurse Practitioner

## 2023-06-17 DIAGNOSIS — F4310 Post-traumatic stress disorder, unspecified: Secondary | ICD-10-CM

## 2023-06-17 DIAGNOSIS — F603 Borderline personality disorder: Secondary | ICD-10-CM

## 2023-06-18 NOTE — Telephone Encounter (Signed)
 Requested medication (s) are due for refill today: Yes  Requested medication (s) are on the active medication list: Yes  Last refill:  05/12/23  Future visit scheduled: No  Notes to clinic:  Unable to refill per protocol, cannot delegate.      Requested Prescriptions  Pending Prescriptions Disp Refills   QUEtiapine (SEROQUEL) 100 MG tablet [Pharmacy Med Name: QUETIAPINE 100MG  TABLETS] 90 tablet     Sig: TAKE 1 TABLET(100 MG) BY MOUTH AT BEDTIME     Not Delegated - Psychiatry:  Antipsychotics - Second Generation (Atypical) - quetiapine Failed - 06/18/2023  9:44 AM      Failed - This refill cannot be delegated      Failed - TSH in normal range and within 360 days    No results found for: "TSH", "POCTSH", "TSHREFLEX"       Failed - Valid encounter within last 6 months    Recent Outpatient Visits   None            Failed - Lipid Panel in normal range within the last 12 months    No results found for: "CHOL", "POCCHOL", "CHOLTOT" No results found for: "LDLCALC", "LDLC", "HIRISKLDL", "POCLDL", "LDLDIRECT", "REALLDLC", "TOTLDLC" No results found for: "HDL", "POCHDL" No results found for: "TRIG", "POCTRIG"       Failed - CBC within normal limits and completed in the last 12 months    WBC  Date Value Ref Range Status  03/19/2022 9.2 4.0 - 10.5 K/uL Final   RBC  Date Value Ref Range Status  03/19/2022 3.19 (L) 3.87 - 5.11 MIL/uL Final   RBC.  Date Value Ref Range Status  03/19/2022 3.37 (L) 3.87 - 5.11 MIL/uL Final   Hemoglobin  Date Value Ref Range Status  03/19/2022 7.4 (L) 12.0 - 15.0 g/dL Final    Comment:    Reticulocyte Hemoglobin testing may be clinically indicated, consider ordering this additional test [id number]    HCT  Date Value Ref Range Status  03/19/2022 24.0 (L) 36.0 - 46.0 % Final   MCHC  Date Value Ref Range Status  03/19/2022 30.8 30.0 - 36.0 g/dL Final   [facility]  Date Value Ref Range Status  03/19/2022 23.2 (L) 26.0 - 34.0 pg Final   MCV   Date Value Ref Range Status  03/19/2022 75.2 (L) 80.0 - 100.0 fL Final   No results found for: "PLTCOUNTKUC", "LABPLAT", "POCPLA" RDW  Date Value Ref Range Status  03/19/2022 17.2 (H) 11.5 - 15.5 % Final         Failed - CMP within normal limits and completed in the last 12 months    Albumin  Date Value Ref Range Status  03/18/2022 3.1 (L) 3.5 - 5.0 g/dL Final   Alkaline Phosphatase  Date Value Ref Range Status  03/18/2022 179 (H) 38 - 126 U/L Final   ALT  Date Value Ref Range Status  03/18/2022 34 0 - 44 U/L Final   AST  Date Value Ref Range Status  03/18/2022 24 15 - 41 U/L Final   BUN  Date Value Ref Range Status  03/19/2022 10 6 - 20 mg/dL Final   Calcium  Date Value Ref Range Status  03/19/2022 8.0 (L) 8.9 - 10.3 mg/dL Final   Calcium, Ion  Date Value Ref Range Status  02/21/2009 0.79 (L) 1.12 - 1.32 mmol/L Final   CO2  Date Value Ref Range Status  03/19/2022 21 (L) 22 - 32 mmol/L Final   TCO2  Date Value  Ref Range Status  02/21/2009 15 0 - 100 mmol/L Final   Creatinine  Date Value Ref Range Status  04/03/2021 172.4 20.0 - 300.0 mg/dL Final   Creatinine, Ser  Date Value Ref Range Status  03/19/2022 0.69 0.44 - 1.00 mg/dL Final   Glucose, Bld  Date Value Ref Range Status  03/19/2022 109 (H) 70 - 99 mg/dL Final    Comment:    Glucose reference range applies only to samples taken after fasting for at least 8 hours.   Glucose-Capillary  Date Value Ref Range Status  03/19/2022 123 (H) 70 - 99 mg/dL Final    Comment:    Glucose reference range applies only to samples taken after fasting for at least 8 hours.   Potassium  Date Value Ref Range Status  03/19/2022 3.2 (L) 3.5 - 5.1 mmol/L Final   Sodium  Date Value Ref Range Status  03/19/2022 136 135 - 145 mmol/L Final   Total Bilirubin  Date Value Ref Range Status  03/18/2022 0.3 0.3 - 1.2 mg/dL Final   Protein, ur  Date Value Ref Range Status  03/17/2022 30 (A) NEGATIVE mg/dL Final    Protein, UA  Date Value Ref Range Status  11/05/2022 Negative Negative Final   Total Protein  Date Value Ref Range Status  03/18/2022 7.1 6.5 - 8.1 g/dL Final   GFR, Estimated  Date Value Ref Range Status  03/19/2022 >60 >60 mL/min Final    Comment:    (NOTE) Calculated using the CKD-EPI Creatinine Equation (2021)          Passed - Completed PHQ-2 or PHQ-9 in the last 360 days      Passed - Last BP in normal range    BP Readings from Last 1 Encounters:  11/22/22 120/78         Passed - Last Heart Rate in normal range    Pulse Readings from Last 1 Encounters:  11/22/22 95

## 2023-08-29 ENCOUNTER — Other Ambulatory Visit: Payer: Self-pay | Admitting: Nurse Practitioner

## 2023-08-29 DIAGNOSIS — F4310 Post-traumatic stress disorder, unspecified: Secondary | ICD-10-CM

## 2023-08-29 DIAGNOSIS — F603 Borderline personality disorder: Secondary | ICD-10-CM

## 2023-09-01 NOTE — Telephone Encounter (Signed)
 Requested medication (s) are due for refill today: yes  Requested medication (s) are on the active medication list: yes  Last refill:  05/12/23 #7   Future visit scheduled: no  Notes to clinic:  this RF cannot be delegated to NT to reorder   Requested Prescriptions  Pending Prescriptions Disp Refills   QUEtiapine  (SEROQUEL ) 100 MG tablet [Pharmacy Med Name: QUETIAPINE  100 MG TAB[*]] 117 tablet 0    Sig: TAKE ONE TABLET BY MOUTH AT BEDTIME     Not Delegated - Psychiatry:  Antipsychotics - Second Generation (Atypical) - quetiapine  Failed - 09/01/2023  2:10 PM      Failed - This refill cannot be delegated      Failed - TSH in normal range and within 360 days    No results found for: TSH, POCTSH, TSHREFLEX       Failed - Valid encounter within last 6 months    Recent Outpatient Visits   None            Failed - Lipid Panel in normal range within the last 12 months    No results found for: CHOL, POCCHOL, CHOLTOT No results found for: LDLCALC, LDLC, HIRISKLDL, POCLDL, LDLDIRECT, REALLDLC, TOTLDLC No results found for: HDL, POCHDL No results found for: TRIG, POCTRIG       Failed - CBC within normal limits and completed in the last 12 months    WBC  Date Value Ref Range Status  03/19/2022 9.2 4.0 - 10.5 K/uL Final   RBC  Date Value Ref Range Status  03/19/2022 3.19 (L) 3.87 - 5.11 MIL/uL Final   RBC.  Date Value Ref Range Status  03/19/2022 3.37 (L) 3.87 - 5.11 MIL/uL Final   Hemoglobin  Date Value Ref Range Status  03/19/2022 7.4 (L) 12.0 - 15.0 g/dL Final    Comment:    Reticulocyte Hemoglobin testing may be clinically indicated, consider ordering this additional test [id number]    HCT  Date Value Ref Range Status  03/19/2022 24.0 (L) 36.0 - 46.0 % Final   MCHC  Date Value Ref Range Status  03/19/2022 30.8 30.0 - 36.0 g/dL Final   [facility]  Date Value Ref Range Status  03/19/2022 23.2 (L) 26.0 - 34.0 pg Final   MCV  Date  Value Ref Range Status  03/19/2022 75.2 (L) 80.0 - 100.0 fL Final   No results found for: PLTCOUNTKUC, LABPLAT, POCPLA RDW  Date Value Ref Range Status  03/19/2022 17.2 (H) 11.5 - 15.5 % Final         Failed - CMP within normal limits and completed in the last 12 months    Albumin  Date Value Ref Range Status  03/18/2022 3.1 (L) 3.5 - 5.0 g/dL Final   Alkaline Phosphatase  Date Value Ref Range Status  03/18/2022 179 (H) 38 - 126 U/L Final   ALT  Date Value Ref Range Status  03/18/2022 34 0 - 44 U/L Final   AST  Date Value Ref Range Status  03/18/2022 24 15 - 41 U/L Final   BUN  Date Value Ref Range Status  03/19/2022 10 6 - 20 mg/dL Final   Calcium  Date Value Ref Range Status  03/19/2022 8.0 (L) 8.9 - 10.3 mg/dL Final   Calcium, Ion  Date Value Ref Range Status  02/21/2009 0.79 (L) 1.12 - 1.32 mmol/L Final   CO2  Date Value Ref Range Status  03/19/2022 21 (L) 22 - 32 mmol/L Final   TCO2  Date  Value Ref Range Status  02/21/2009 15 0 - 100 mmol/L Final   Creatinine  Date Value Ref Range Status  04/03/2021 172.4 20.0 - 300.0 mg/dL Final   Creatinine, Ser  Date Value Ref Range Status  03/19/2022 0.69 0.44 - 1.00 mg/dL Final   Glucose, Bld  Date Value Ref Range Status  03/19/2022 109 (H) 70 - 99 mg/dL Final    Comment:    Glucose reference range applies only to samples taken after fasting for at least 8 hours.   Glucose-Capillary  Date Value Ref Range Status  03/19/2022 123 (H) 70 - 99 mg/dL Final    Comment:    Glucose reference range applies only to samples taken after fasting for at least 8 hours.   Potassium  Date Value Ref Range Status  03/19/2022 3.2 (L) 3.5 - 5.1 mmol/L Final   Sodium  Date Value Ref Range Status  03/19/2022 136 135 - 145 mmol/L Final   Total Bilirubin  Date Value Ref Range Status  03/18/2022 0.3 0.3 - 1.2 mg/dL Final   Protein, ur  Date Value Ref Range Status  03/17/2022 30 (A) NEGATIVE mg/dL Final    Protein, UA  Date Value Ref Range Status  11/05/2022 Negative Negative Final   Total Protein  Date Value Ref Range Status  03/18/2022 7.1 6.5 - 8.1 g/dL Final   GFR, Estimated  Date Value Ref Range Status  03/19/2022 >60 >60 mL/min Final    Comment:    (NOTE) Calculated using the CKD-EPI Creatinine Equation (2021)          Passed - Completed PHQ-2 or PHQ-9 in the last 360 days      Passed - Last BP in normal range    BP Readings from Last 1 Encounters:  11/22/22 120/78         Passed - Last Heart Rate in normal range    Pulse Readings from Last 1 Encounters:  11/22/22 95

## 2024-03-26 ENCOUNTER — Inpatient Hospital Stay: Payer: MEDICAID | Admitting: Nurse Practitioner
